# Patient Record
Sex: Female | Born: 1950 | Race: White | Hispanic: No | Smoking: Never smoker
Health system: Southern US, Community
[De-identification: ages and names within clinical notes are randomized; demographics above are authoritative.]

## PROBLEM LIST (undated history)

## (undated) DIAGNOSIS — Z803 Family history of malignant neoplasm of breast: Secondary | ICD-10-CM

## (undated) DIAGNOSIS — K219 Gastro-esophageal reflux disease without esophagitis: Secondary | ICD-10-CM

## (undated) DIAGNOSIS — Z860100 Personal history of colon polyps, unspecified: Secondary | ICD-10-CM

## (undated) DIAGNOSIS — I712 Thoracic aortic aneurysm, without rupture, unspecified: Secondary | ICD-10-CM

## (undated) DIAGNOSIS — Z9884 Bariatric surgery status: Secondary | ICD-10-CM

## (undated) DIAGNOSIS — Z9221 Personal history of antineoplastic chemotherapy: Secondary | ICD-10-CM

## (undated) DIAGNOSIS — G5600 Carpal tunnel syndrome, unspecified upper limb: Secondary | ICD-10-CM

## (undated) DIAGNOSIS — I4891 Unspecified atrial fibrillation: Secondary | ICD-10-CM

## (undated) DIAGNOSIS — I10 Essential (primary) hypertension: Secondary | ICD-10-CM

## (undated) DIAGNOSIS — F419 Anxiety disorder, unspecified: Secondary | ICD-10-CM

## (undated) DIAGNOSIS — M069 Rheumatoid arthritis, unspecified: Secondary | ICD-10-CM

## (undated) DIAGNOSIS — C50919 Malignant neoplasm of unspecified site of unspecified female breast: Secondary | ICD-10-CM

## (undated) DIAGNOSIS — D0512 Intraductal carcinoma in situ of left breast: Secondary | ICD-10-CM

## (undated) HISTORY — PX: BLADDER REPAIR: SHX76

## (undated) HISTORY — DX: Personal history of colon polyps, unspecified: Z86.0100

## (undated) HISTORY — DX: Carpal tunnel syndrome, unspecified upper limb: G56.00

## (undated) HISTORY — PX: HYSTERECTOMY ABDOMINAL WITH SALPINGECTOMY: SHX6725

## (undated) HISTORY — DX: Intraductal carcinoma in situ of left breast: D05.12

## (undated) HISTORY — DX: Family history of malignant neoplasm of breast: Z80.3

## (undated) HISTORY — DX: Thoracic aortic aneurysm, without rupture, unspecified: I71.20

## (undated) HISTORY — DX: Unspecified atrial fibrillation: I48.91

## (undated) HISTORY — PX: GASTRIC BYPASS: SHX52

## (undated) HISTORY — DX: Essential (primary) hypertension: I10

## (undated) HISTORY — PX: COLONOSCOPY W/ POLYPECTOMY: SHX1380

## (undated) HISTORY — DX: Thoracic aortic aneurysm, without rupture: I71.2

## (undated) HISTORY — DX: Rheumatoid arthritis, unspecified: M06.9

## (undated) HISTORY — PX: BREAST BIOPSY: SHX20

## (undated) HISTORY — DX: Bariatric surgery status: Z98.84

## (undated) HISTORY — PX: ABDOMINAL HYSTERECTOMY: SHX81

---
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
## 1998-11-14 ENCOUNTER — Encounter: Admission: RE | Admit: 1998-11-14 | Discharge: 1998-11-14 | Payer: Self-pay | Admitting: Family Medicine

## 1998-12-05 ENCOUNTER — Encounter: Admission: RE | Admit: 1998-12-05 | Discharge: 1998-12-05 | Payer: Self-pay | Admitting: Family Medicine

## 1999-03-06 ENCOUNTER — Encounter: Admission: RE | Admit: 1999-03-06 | Discharge: 1999-03-06 | Payer: Self-pay | Admitting: Family Medicine

## 1999-08-21 ENCOUNTER — Encounter: Admission: RE | Admit: 1999-08-21 | Discharge: 1999-08-21 | Payer: Self-pay | Admitting: Family Medicine

## 1999-08-30 ENCOUNTER — Encounter: Admission: RE | Admit: 1999-08-30 | Discharge: 1999-08-30 | Payer: Self-pay | Admitting: Family Medicine

## 1999-10-16 ENCOUNTER — Encounter: Admission: RE | Admit: 1999-10-16 | Discharge: 1999-10-16 | Payer: Self-pay | Admitting: Family Medicine

## 2000-03-03 ENCOUNTER — Encounter (INDEPENDENT_AMBULATORY_CARE_PROVIDER_SITE_OTHER): Payer: Self-pay | Admitting: *Deleted

## 2000-04-26 ENCOUNTER — Ambulatory Visit: Admission: RE | Admit: 2000-04-26 | Discharge: 2000-04-26 | Payer: Self-pay | Admitting: Family Medicine

## 2000-04-26 ENCOUNTER — Encounter: Admission: RE | Admit: 2000-04-26 | Discharge: 2000-04-26 | Payer: Self-pay | Admitting: Family Medicine

## 2000-05-03 ENCOUNTER — Encounter: Admission: RE | Admit: 2000-05-03 | Discharge: 2000-05-03 | Payer: Self-pay | Admitting: Family Medicine

## 2000-05-03 ENCOUNTER — Encounter: Payer: Self-pay | Admitting: Family Medicine

## 2000-06-04 ENCOUNTER — Ambulatory Visit (HOSPITAL_COMMUNITY): Admission: RE | Admit: 2000-06-04 | Discharge: 2000-06-04 | Payer: Self-pay | Admitting: Cardiology

## 2000-06-17 ENCOUNTER — Encounter: Admission: RE | Admit: 2000-06-17 | Discharge: 2000-06-17 | Payer: Self-pay | Admitting: Family Medicine

## 2000-07-27 ENCOUNTER — Ambulatory Visit (HOSPITAL_BASED_OUTPATIENT_CLINIC_OR_DEPARTMENT_OTHER): Admission: RE | Admit: 2000-07-27 | Discharge: 2000-07-27 | Payer: Self-pay | Admitting: Family Medicine

## 2000-08-19 ENCOUNTER — Encounter: Admission: RE | Admit: 2000-08-19 | Discharge: 2000-08-19 | Payer: Self-pay | Admitting: Family Medicine

## 2000-08-20 DIAGNOSIS — G4730 Sleep apnea, unspecified: Secondary | ICD-10-CM

## 2000-08-20 HISTORY — DX: Sleep apnea, unspecified: G47.30

## 2000-12-13 ENCOUNTER — Encounter: Admission: RE | Admit: 2000-12-13 | Discharge: 2000-12-13 | Payer: Self-pay | Admitting: Family Medicine

## 2000-12-19 ENCOUNTER — Encounter: Admission: RE | Admit: 2000-12-19 | Discharge: 2000-12-19 | Payer: Self-pay | Admitting: Family Medicine

## 2000-12-19 ENCOUNTER — Encounter: Payer: Self-pay | Admitting: Family Medicine

## 2001-01-06 ENCOUNTER — Encounter: Admission: RE | Admit: 2001-01-06 | Discharge: 2001-01-06 | Payer: Self-pay | Admitting: Family Medicine

## 2001-04-22 ENCOUNTER — Encounter (INDEPENDENT_AMBULATORY_CARE_PROVIDER_SITE_OTHER): Payer: Self-pay | Admitting: Specialist

## 2001-04-22 ENCOUNTER — Other Ambulatory Visit: Admission: RE | Admit: 2001-04-22 | Discharge: 2001-04-22 | Payer: Self-pay | Admitting: *Deleted

## 2001-05-28 ENCOUNTER — Encounter (INDEPENDENT_AMBULATORY_CARE_PROVIDER_SITE_OTHER): Payer: Self-pay

## 2001-05-29 ENCOUNTER — Inpatient Hospital Stay (HOSPITAL_COMMUNITY): Admission: RE | Admit: 2001-05-29 | Discharge: 2001-05-30 | Payer: Self-pay | Admitting: *Deleted

## 2001-07-28 ENCOUNTER — Encounter: Admission: RE | Admit: 2001-07-28 | Discharge: 2001-07-28 | Payer: Self-pay | Admitting: Family Medicine

## 2001-09-01 ENCOUNTER — Encounter: Admission: RE | Admit: 2001-09-01 | Discharge: 2001-09-01 | Payer: Self-pay | Admitting: Family Medicine

## 2001-09-12 ENCOUNTER — Encounter: Admission: RE | Admit: 2001-09-12 | Discharge: 2001-09-12 | Payer: Self-pay | Admitting: Family Medicine

## 2001-10-22 ENCOUNTER — Ambulatory Visit (HOSPITAL_COMMUNITY): Admission: RE | Admit: 2001-10-22 | Discharge: 2001-10-22 | Payer: Self-pay | Admitting: Gastroenterology

## 2002-07-03 ENCOUNTER — Encounter: Admission: RE | Admit: 2002-07-03 | Discharge: 2002-07-03 | Payer: Self-pay | Admitting: Family Medicine

## 2002-07-03 ENCOUNTER — Encounter: Payer: Self-pay | Admitting: Family Medicine

## 2002-07-10 ENCOUNTER — Encounter: Admission: RE | Admit: 2002-07-10 | Discharge: 2002-07-10 | Payer: Self-pay | Admitting: Family Medicine

## 2002-07-16 ENCOUNTER — Encounter: Payer: Self-pay | Admitting: Family Medicine

## 2002-07-16 ENCOUNTER — Encounter: Admission: RE | Admit: 2002-07-16 | Discharge: 2002-07-16 | Payer: Self-pay | Admitting: Family Medicine

## 2003-03-09 ENCOUNTER — Inpatient Hospital Stay (HOSPITAL_COMMUNITY): Admission: AC | Admit: 2003-03-09 | Discharge: 2003-03-10 | Payer: Self-pay

## 2003-03-09 ENCOUNTER — Encounter: Payer: Self-pay | Admitting: Emergency Medicine

## 2003-03-24 ENCOUNTER — Encounter: Admission: RE | Admit: 2003-03-24 | Discharge: 2003-03-24 | Payer: Self-pay | Admitting: Family Medicine

## 2003-03-24 ENCOUNTER — Encounter: Admission: RE | Admit: 2003-03-24 | Discharge: 2003-03-24 | Payer: Self-pay | Admitting: Sports Medicine

## 2003-03-24 ENCOUNTER — Encounter: Payer: Self-pay | Admitting: Sports Medicine

## 2003-05-21 ENCOUNTER — Other Ambulatory Visit: Admission: RE | Admit: 2003-05-21 | Discharge: 2003-05-21 | Payer: Self-pay | Admitting: Obstetrics and Gynecology

## 2003-07-21 ENCOUNTER — Encounter: Admission: RE | Admit: 2003-07-21 | Discharge: 2003-07-21 | Payer: Self-pay | Admitting: Family Medicine

## 2003-09-17 ENCOUNTER — Encounter: Admission: RE | Admit: 2003-09-17 | Discharge: 2003-09-17 | Payer: Self-pay | Admitting: Family Medicine

## 2003-10-15 ENCOUNTER — Encounter: Admission: RE | Admit: 2003-10-15 | Discharge: 2003-10-15 | Payer: Self-pay | Admitting: Family Medicine

## 2004-02-02 ENCOUNTER — Encounter: Admission: RE | Admit: 2004-02-02 | Discharge: 2004-02-02 | Payer: Self-pay | Admitting: Family Medicine

## 2004-12-01 ENCOUNTER — Encounter (INDEPENDENT_AMBULATORY_CARE_PROVIDER_SITE_OTHER): Payer: Self-pay | Admitting: Specialist

## 2004-12-01 ENCOUNTER — Ambulatory Visit (HOSPITAL_COMMUNITY): Admission: RE | Admit: 2004-12-01 | Discharge: 2004-12-01 | Payer: Self-pay | Admitting: Obstetrics and Gynecology

## 2005-03-20 ENCOUNTER — Ambulatory Visit: Payer: Self-pay | Admitting: Family Medicine

## 2005-03-28 ENCOUNTER — Encounter: Admission: RE | Admit: 2005-03-28 | Discharge: 2005-03-28 | Payer: Self-pay | Admitting: Obstetrics and Gynecology

## 2005-09-24 ENCOUNTER — Encounter: Admission: RE | Admit: 2005-09-24 | Discharge: 2005-09-24 | Payer: Self-pay | Admitting: Family Medicine

## 2005-09-24 ENCOUNTER — Ambulatory Visit: Payer: Self-pay | Admitting: Family Medicine

## 2005-10-01 ENCOUNTER — Ambulatory Visit: Payer: Self-pay | Admitting: Sports Medicine

## 2005-10-22 ENCOUNTER — Ambulatory Visit: Payer: Self-pay | Admitting: Family Medicine

## 2005-10-26 ENCOUNTER — Encounter: Admission: RE | Admit: 2005-10-26 | Discharge: 2005-10-26 | Payer: Self-pay | Admitting: Family Medicine

## 2005-11-02 ENCOUNTER — Encounter: Admission: RE | Admit: 2005-11-02 | Discharge: 2005-11-02 | Payer: Self-pay | Admitting: Family Medicine

## 2006-01-15 ENCOUNTER — Ambulatory Visit: Payer: Self-pay | Admitting: Family Medicine

## 2006-01-15 ENCOUNTER — Inpatient Hospital Stay (HOSPITAL_COMMUNITY): Admission: EM | Admit: 2006-01-15 | Discharge: 2006-01-17 | Payer: Self-pay | Admitting: Emergency Medicine

## 2006-01-18 ENCOUNTER — Ambulatory Visit: Payer: Self-pay | Admitting: Family Medicine

## 2006-01-25 ENCOUNTER — Ambulatory Visit: Payer: Self-pay | Admitting: Family Medicine

## 2006-02-05 ENCOUNTER — Encounter: Admission: RE | Admit: 2006-02-05 | Discharge: 2006-02-28 | Payer: Self-pay | Admitting: Family Medicine

## 2006-02-15 ENCOUNTER — Ambulatory Visit: Payer: Self-pay | Admitting: Family Medicine

## 2006-04-11 ENCOUNTER — Encounter: Admission: RE | Admit: 2006-04-11 | Discharge: 2006-04-11 | Payer: Self-pay | Admitting: Obstetrics and Gynecology

## 2006-05-06 ENCOUNTER — Ambulatory Visit: Payer: Self-pay | Admitting: Family Medicine

## 2006-10-10 ENCOUNTER — Ambulatory Visit: Payer: Self-pay | Admitting: Sports Medicine

## 2007-01-03 ENCOUNTER — Ambulatory Visit: Payer: Self-pay | Admitting: Family Medicine

## 2007-01-10 ENCOUNTER — Ambulatory Visit: Payer: Self-pay | Admitting: Family Medicine

## 2007-01-30 DIAGNOSIS — L719 Rosacea, unspecified: Secondary | ICD-10-CM

## 2007-01-30 DIAGNOSIS — K649 Unspecified hemorrhoids: Secondary | ICD-10-CM | POA: Insufficient documentation

## 2007-01-30 DIAGNOSIS — K2100 Gastro-esophageal reflux disease with esophagitis, without bleeding: Secondary | ICD-10-CM

## 2007-01-30 DIAGNOSIS — G4700 Insomnia, unspecified: Secondary | ICD-10-CM | POA: Insufficient documentation

## 2007-01-30 DIAGNOSIS — M545 Low back pain, unspecified: Secondary | ICD-10-CM | POA: Insufficient documentation

## 2007-01-30 DIAGNOSIS — G4730 Sleep apnea, unspecified: Secondary | ICD-10-CM | POA: Insufficient documentation

## 2007-01-30 DIAGNOSIS — F32A Depression, unspecified: Secondary | ICD-10-CM | POA: Insufficient documentation

## 2007-01-30 DIAGNOSIS — F329 Major depressive disorder, single episode, unspecified: Secondary | ICD-10-CM

## 2007-01-30 DIAGNOSIS — E669 Obesity, unspecified: Secondary | ICD-10-CM | POA: Insufficient documentation

## 2007-01-30 DIAGNOSIS — G43909 Migraine, unspecified, not intractable, without status migrainosus: Secondary | ICD-10-CM | POA: Insufficient documentation

## 2007-01-30 DIAGNOSIS — F419 Anxiety disorder, unspecified: Secondary | ICD-10-CM | POA: Insufficient documentation

## 2007-01-30 DIAGNOSIS — K5730 Diverticulosis of large intestine without perforation or abscess without bleeding: Secondary | ICD-10-CM | POA: Insufficient documentation

## 2007-01-30 DIAGNOSIS — I10 Essential (primary) hypertension: Secondary | ICD-10-CM | POA: Insufficient documentation

## 2007-01-30 HISTORY — DX: Gastro-esophageal reflux disease with esophagitis, without bleeding: K21.00

## 2007-01-30 HISTORY — DX: Rosacea, unspecified: L71.9

## 2007-01-30 HISTORY — DX: Diverticulosis of large intestine without perforation or abscess without bleeding: K57.30

## 2007-01-31 ENCOUNTER — Encounter (INDEPENDENT_AMBULATORY_CARE_PROVIDER_SITE_OTHER): Payer: Self-pay | Admitting: *Deleted

## 2007-02-17 ENCOUNTER — Telehealth (INDEPENDENT_AMBULATORY_CARE_PROVIDER_SITE_OTHER): Payer: Self-pay | Admitting: *Deleted

## 2007-09-12 ENCOUNTER — Ambulatory Visit: Payer: Self-pay | Admitting: Family Medicine

## 2007-09-12 ENCOUNTER — Ambulatory Visit (HOSPITAL_COMMUNITY): Admission: RE | Admit: 2007-09-12 | Discharge: 2007-09-12 | Payer: Self-pay | Admitting: Family Medicine

## 2007-09-12 DIAGNOSIS — R079 Chest pain, unspecified: Secondary | ICD-10-CM | POA: Insufficient documentation

## 2007-09-16 ENCOUNTER — Encounter: Payer: Self-pay | Admitting: Family Medicine

## 2007-09-16 ENCOUNTER — Ambulatory Visit: Payer: Self-pay | Admitting: Family Medicine

## 2007-09-16 LAB — CONVERTED CEMR LAB
AST: 25 units/L (ref 0–37)
Alkaline Phosphatase: 53 units/L (ref 39–117)
Chloride: 104 meq/L (ref 96–112)
Creatinine, Ser: 1.38 mg/dL — ABNORMAL HIGH (ref 0.40–1.20)
Glucose, Bld: 96 mg/dL (ref 70–99)
Potassium: 4.4 meq/L (ref 3.5–5.3)
Sodium: 140 meq/L (ref 135–145)
TSH: 0.517 microintl units/mL (ref 0.350–5.50)
Total Bilirubin: 0.6 mg/dL (ref 0.3–1.2)
Triglycerides: 172 mg/dL — ABNORMAL HIGH (ref <150)

## 2007-09-23 ENCOUNTER — Encounter: Admission: RE | Admit: 2007-09-23 | Discharge: 2007-09-23 | Payer: Self-pay | Admitting: Obstetrics and Gynecology

## 2007-09-23 ENCOUNTER — Encounter: Payer: Self-pay | Admitting: Family Medicine

## 2007-09-24 ENCOUNTER — Encounter: Payer: Self-pay | Admitting: Family Medicine

## 2007-11-21 ENCOUNTER — Encounter: Payer: Self-pay | Admitting: Family Medicine

## 2007-12-31 ENCOUNTER — Ambulatory Visit: Payer: Self-pay | Admitting: Family Medicine

## 2007-12-31 LAB — CONVERTED CEMR LAB: Rapid Strep: NEGATIVE

## 2008-02-06 ENCOUNTER — Ambulatory Visit (HOSPITAL_COMMUNITY): Admission: RE | Admit: 2008-02-06 | Discharge: 2008-02-06 | Payer: Self-pay | Admitting: Surgery

## 2008-02-11 ENCOUNTER — Ambulatory Visit (HOSPITAL_COMMUNITY): Admission: RE | Admit: 2008-02-11 | Discharge: 2008-02-11 | Payer: Self-pay | Admitting: Surgery

## 2008-03-01 ENCOUNTER — Encounter: Admission: RE | Admit: 2008-03-01 | Discharge: 2008-03-01 | Payer: Self-pay | Admitting: Surgery

## 2008-03-15 ENCOUNTER — Ambulatory Visit: Payer: Self-pay | Admitting: Sports Medicine

## 2008-03-15 ENCOUNTER — Encounter: Payer: Self-pay | Admitting: Family Medicine

## 2008-03-15 ENCOUNTER — Encounter (INDEPENDENT_AMBULATORY_CARE_PROVIDER_SITE_OTHER): Payer: Self-pay | Admitting: Family Medicine

## 2008-03-15 DIAGNOSIS — R319 Hematuria, unspecified: Secondary | ICD-10-CM | POA: Insufficient documentation

## 2008-03-15 LAB — CONVERTED CEMR LAB
Bilirubin Urine: NEGATIVE
Ketones, urine, test strip: NEGATIVE
Protein, U semiquant: 100
pH: 7

## 2008-03-29 ENCOUNTER — Ambulatory Visit: Payer: Self-pay | Admitting: Family Medicine

## 2008-03-29 LAB — CONVERTED CEMR LAB
Bilirubin Urine: NEGATIVE
Blood in Urine, dipstick: NEGATIVE
Glucose, Urine, Semiquant: NEGATIVE
Nitrite: NEGATIVE
Protein, U semiquant: NEGATIVE
pH: 5.5

## 2008-03-30 ENCOUNTER — Ambulatory Visit (HOSPITAL_COMMUNITY): Admission: RE | Admit: 2008-03-30 | Discharge: 2008-03-30 | Payer: Self-pay | Admitting: Surgery

## 2008-05-27 ENCOUNTER — Encounter: Admission: RE | Admit: 2008-05-27 | Discharge: 2008-08-24 | Payer: Self-pay | Admitting: Surgery

## 2008-06-10 ENCOUNTER — Encounter: Payer: Self-pay | Admitting: Family Medicine

## 2008-06-14 ENCOUNTER — Telehealth: Payer: Self-pay | Admitting: *Deleted

## 2008-06-14 ENCOUNTER — Inpatient Hospital Stay (HOSPITAL_COMMUNITY): Admission: RE | Admit: 2008-06-14 | Discharge: 2008-06-16 | Payer: Self-pay | Admitting: Surgery

## 2008-06-15 ENCOUNTER — Ambulatory Visit: Payer: Self-pay | Admitting: Vascular Surgery

## 2008-06-15 ENCOUNTER — Encounter (INDEPENDENT_AMBULATORY_CARE_PROVIDER_SITE_OTHER): Payer: Self-pay | Admitting: Surgery

## 2008-06-18 ENCOUNTER — Telehealth (INDEPENDENT_AMBULATORY_CARE_PROVIDER_SITE_OTHER): Payer: Self-pay | Admitting: *Deleted

## 2008-06-24 ENCOUNTER — Ambulatory Visit: Payer: Self-pay | Admitting: Family Medicine

## 2008-08-15 ENCOUNTER — Emergency Department (HOSPITAL_COMMUNITY): Admission: EM | Admit: 2008-08-15 | Discharge: 2008-08-15 | Payer: Self-pay | Admitting: Emergency Medicine

## 2008-09-06 LAB — CONVERTED CEMR LAB
AST: 21 units/L
Calcium: 10.4 mg/dL
Cholesterol: 131 mg/dL
Creatinine, Ser: 0.78 mg/dL
HCT: 38.1 %
HDL: 37 mg/dL
Hemoglobin: 12.8 g/dL
Platelets: 141 10*3/uL
Saturation Ratios: 24 %
Sodium: 141 meq/L
TIBC: 224 ug/dL
Total Bilirubin: 0.9 mg/dL
Triglycerides: 99 mg/dL
UIBC: 171 ug/dL
WBC: 5.7 10*3/uL

## 2008-09-14 ENCOUNTER — Encounter: Payer: Self-pay | Admitting: Family Medicine

## 2008-09-17 ENCOUNTER — Encounter: Admission: RE | Admit: 2008-09-17 | Discharge: 2008-09-17 | Payer: Self-pay | Admitting: Family Medicine

## 2008-09-17 ENCOUNTER — Ambulatory Visit: Payer: Self-pay | Admitting: Family Medicine

## 2008-09-17 DIAGNOSIS — N200 Calculus of kidney: Secondary | ICD-10-CM

## 2008-09-17 DIAGNOSIS — N281 Cyst of kidney, acquired: Secondary | ICD-10-CM | POA: Insufficient documentation

## 2008-09-17 HISTORY — DX: Calculus of kidney: N20.0

## 2008-09-17 HISTORY — DX: Cyst of kidney, acquired: N28.1

## 2008-09-17 LAB — CONVERTED CEMR LAB
Glucose, Urine, Semiquant: NEGATIVE
Nitrite: NEGATIVE
Specific Gravity, Urine: >=1.03
WBC Urine, dipstick: NEGATIVE
pH: 5.5

## 2008-09-30 ENCOUNTER — Encounter: Admission: RE | Admit: 2008-09-30 | Discharge: 2008-09-30 | Payer: Self-pay | Admitting: Obstetrics and Gynecology

## 2008-11-15 ENCOUNTER — Telehealth: Payer: Self-pay | Admitting: *Deleted

## 2008-11-16 ENCOUNTER — Ambulatory Visit: Payer: Self-pay | Admitting: Sports Medicine

## 2008-11-16 ENCOUNTER — Encounter (INDEPENDENT_AMBULATORY_CARE_PROVIDER_SITE_OTHER): Payer: Self-pay | Admitting: *Deleted

## 2008-11-16 DIAGNOSIS — M66259 Spontaneous rupture of extensor tendons, unspecified thigh: Secondary | ICD-10-CM | POA: Insufficient documentation

## 2008-11-16 DIAGNOSIS — M25569 Pain in unspecified knee: Secondary | ICD-10-CM | POA: Insufficient documentation

## 2008-11-30 ENCOUNTER — Ambulatory Visit: Payer: Self-pay | Admitting: Sports Medicine

## 2008-12-14 ENCOUNTER — Ambulatory Visit: Payer: Self-pay | Admitting: Sports Medicine

## 2008-12-28 ENCOUNTER — Ambulatory Visit: Payer: Self-pay | Admitting: Sports Medicine

## 2009-01-06 ENCOUNTER — Encounter: Payer: Self-pay | Admitting: Family Medicine

## 2009-10-14 ENCOUNTER — Ambulatory Visit: Payer: Self-pay | Admitting: Family Medicine

## 2009-12-13 ENCOUNTER — Telehealth: Payer: Self-pay | Admitting: Family Medicine

## 2009-12-14 ENCOUNTER — Encounter: Payer: Self-pay | Admitting: Family Medicine

## 2010-12-24 ENCOUNTER — Encounter: Payer: Self-pay | Admitting: Surgery

## 2011-01-04 ENCOUNTER — Other Ambulatory Visit: Payer: Self-pay | Admitting: *Deleted

## 2011-01-04 ENCOUNTER — Other Ambulatory Visit: Payer: Self-pay | Admitting: Obstetrics and Gynecology

## 2011-01-04 DIAGNOSIS — Z1231 Encounter for screening mammogram for malignant neoplasm of breast: Secondary | ICD-10-CM

## 2011-01-04 DIAGNOSIS — Z1239 Encounter for other screening for malignant neoplasm of breast: Secondary | ICD-10-CM

## 2011-01-04 NOTE — Miscellaneous (Signed)
Summary: restart BP meds  Clinical Lists Changes See previous phone note. Medications: Changed medication from ENALAPRIL MALEATE 10 MG TABS (ENALAPRIL MALEATE) one by mouth daily to HYDROCHLOROTHIAZIDE 12.5 MG  TABS (HYDROCHLOROTHIAZIDE) Take 1 tab  by mouth every morning - Signed Rx of HYDROCHLOROTHIAZIDE 12.5 MG  TABS (HYDROCHLOROTHIAZIDE) Take 1 tab  by mouth every morning;  #90 x 3;  Signed;  Entered by: Doralee Albino MD;  Authorized by: Doralee Albino MD;  Method used: Electronically to [facility] [address] [place], [place], [place]  [address], Ph: [phone], Fax: [phone]    Prescriptions: HYDROCHLOROTHIAZIDE 12.5 MG  TABS (HYDROCHLOROTHIAZIDE) Take 1 tab  by mouth every morning  #90 x 3   Entered and Authorized by:   Doralee Albino MD   Signed by:   Doralee Albino MD on 12/14/2009   Method used:   Electronically to        [facility] (retail)       [address]       [place], [place]  [address]       Ph: [phone]       Fax: [phone]   RxID:   [id number]

## 2011-01-04 NOTE — Progress Notes (Signed)
Summary: phn msg  Phone Note Call from Patient Call back at [facility] Phone [phone]   Caller: Patient Summary of Call: Pt used to be on bp meds but went off of them when she had gastric bypass surgery.  For the past month she has had headaches and been taking her bp and it has been running about 140/90.  This am it was 145/97.  Wondering if she should go back on bp meds.   Initial call taken by: Clydell Hakim,  December 13, 2009 9:19 AM  Follow-up for Phone Call        Forward to PCP Follow-up by: Gladstone Pih,  December 13, 2009 10:26 AM  Additional Follow-up for Phone Call Additional follow up Details #1::        Called and left message. Additional Follow-up by: Doralee Albino MD,  December 14, 2009 9:57 AM

## 2011-01-12 ENCOUNTER — Ambulatory Visit
Admission: RE | Admit: 2011-01-12 | Discharge: 2011-01-12 | Disposition: A | Discharge status: Home / Self Care | Payer: BC Managed Care – PPO | Source: Ambulatory Visit | Attending: Obstetrics and Gynecology | Admitting: Obstetrics and Gynecology

## 2011-01-12 DIAGNOSIS — Z1231 Encounter for screening mammogram for malignant neoplasm of breast: Secondary | ICD-10-CM

## 2011-02-03 ENCOUNTER — Other Ambulatory Visit: Payer: Self-pay | Admitting: Family Medicine

## 2011-02-05 NOTE — Telephone Encounter (Signed)
Refill request

## 2011-04-17 NOTE — Op Note (Signed)
Christina Hardin, Christina Hardin               ACCOUNT NO.:  [id number]   MEDICAL RECORD NO.:  [id number]          PATIENT TYPE:  INP   LOCATION:  0004                         FACILITY:  [facility]   PHYSICIAN:  Sandria Bales. Ezzard Standing, M.D.  DATE OF BIRTH:  1951/11/08   DATE OF PROCEDURE:  06/14/2008  DATE OF DISCHARGE:                               OPERATIVE REPORT   Date of Surgery ??   PREOPERATIVE DIAGNOSIS:  Morbid obesity (weight 247, body mass index of  41.7).   POSTOPERATIVE DIAGNOSIS:  Morbid obesity (weight 247, body mass index of  41.7).   PROCEDURE:  Laparoscopic Roux-en-Y gastric bypass  (antecolic/antegastric), upper endoscopy.   SURGEON:  Dr. Ezzard Standing.   FIRST ASSISTANT:  Dr. Wenda Low.   ANESTHESIA:  General endotracheal.   ESTIMATED BLOOD LOSS:  Minimal.   PROCEDURE:  [name] is a 60 year old white female patient of Dr.  Doralee Albino who has been morbidly base much of her adult life.  She  had been through our preoperative bariatric program and now comes for  attempted laparoscopic Roux-en-Y gastric bypass.   The indications and potential complications of the procedure were  explained to the patient.  Potential complications include but not  limited to bleeding, infection, bowel leak, deep venous thrombosis, open  surgery, and the possibility of long-term nutritional consequences.   OPERATIVE NOTE:  The patient placed in a supine position.  She was given  2 grams of cefoxitin at the initiation of the procedure.  She had an OG  tube in place and Foley catheter in place.  Her abdomen was prepped with  Techni-Care and sterilely draped.  A timeout was held to identify the  patient and the procedure.   I accessed the abdominal cavity through the left upper quadrant using a  12-mm Ethicon Optiview trocar.  Abdominal exploration was carried out.  The right and left lobes of the liver had some fatty infiltration  consistent with her weight but no other mass or abnormality.   The  stomach that I could see was unremarkable.  The bowel that I could see  was unremarkable.   I then placed six additional trocars.  I placed a 5 mm subxiphoid trocar  for the liver retractor.  I placed a 12 mm right subcostal trocar.  I  placed a 12 mm right lateral subcostal trocar, a 12 mm left lateral  subcostal trocar, a 12 mm left abdominal trocar, a 5 mm trocar in the  left lateral subcostal region, and an 11 mm inferior to the right of the  umbilicus.   The first thing I did was identify the ligament of Treitz.  I counted  down 40 cm and divided the proximal jejunum with a white load of the  Endo GIA 45 mm stapler.  I then took down the mesentery.  I then counted  100 cm on the future gastric limb.  I tightened this future gastric limb  with a Penrose drain.  I then carried out a side-to-side  jejunojejunostomy again with a white load of the Endo-GIA, creating the  enterotomy and closing the  enterotomy with two 2-0 Vicryl sutures.  I  then closed the mesentery of the jejunojejunostomy mesentery with a  running 2-0 chromic suture with lap ties on both ends and I placed  Tisseel over the jejunojejunostomy.   I then divided the omentum with the harmonic scalpel down to the  transverse colon.  The patient was then placed in a reverse  Trendelenburg position with his head up.  I placed a Nissan retractor 55  mm subxiphoid trocar to retract the liver, exposing the gastric hiatus.   I then started my dissection out, creating a gastric pouch.  I first  went along the angle of Hiss first to expose this area, then I came over  the lesser curve approximately 5 cm below the gastroesophageal junction.  I got into the lesser sac.  I then used first a blue load of the Endo 45  mm stapler.  I then used two blue loads of the 60 mm Ethicon stapler and  then another blue load of the 45 stapler to create an approximately 5 x  3 cm gastric pouch.  I over-sewed the distal gastric remnant with  a  locking 2-0 Vicryl suture.  I placed Tisseel along the greater curvature  side of the newly-created sac.   I then went down and retrieved the gastric limb of the jejunum and I  sewed this to the gastric pouch using a posterior running 2-0 Vicryl  suture.  This created an approximately 2.5 cm opening using the linear  45 mm Ethicon stapler.  I then closed the enterotomy with an two running  2-0 Vicryl sutures and then I closed the anterior row with 2-0 Vicryl  sutures.   I had sewed the anterior closure over the e-wall to make sure the lumen  was adequate.  At this point, Dr. Daphine Deutscher scrubbed out and did an upper  endoscopy to document both the pouch size to make sure there was no  bleeding and make sure there was no leak.   I clamped off the jejunum.  He insufflated air to the gastric pouch.  He  saw about a 5-6 cm pouch.  There was no air leak and the mucosa looked  viable.   He then removed the esophagogastroduodenoscope.  I irrigated the upper  abdomen with about a liter of saline.  On re-exam of our principle  anastomosis, I did not think I could get easily sutures of Peterson.  I  did not try to suture that.  Thought I had the split mesentery which  covered it all pretty well.  I then placed Tisseel over the  gastrojejunostomy over the distal jejunal limb.  I then took photos of  the anastomosis.  The incisions all looked good.   I then removed the trocars in turn.  There was no bleeding at any trocar  site.  Each trocar site was closed with 5-0 Vicryl suture, painted with  tincture of Benzoin, and Steri-Stripped.  The patient tolerated the  procedure well.  I used about 30 mL of 0.25% Marcaine by local  anesthetic in the trocar sites.  The patient was transferred to the  recovery room in good condition.  Sponge and needle count were correct.      Sandria Bales. Ezzard Standing, M.D.  Electronically Signed     DHN/MEDQ  D:  06/14/2008  T:  06/14/2008  Job:  [id number]   cc:   William  A. Leveda Anna, M.D.

## 2011-04-20 NOTE — Discharge Summary (Signed)
Christina Hardin, Christina Hardin               ACCOUNT NO.:  [id number]   MEDICAL RECORD NO.:  [id number]          PATIENT TYPE:  INP   LOCATION:  5707                         FACILITY:  [facility]   PHYSICIAN:  Leighton Roach McDiarmid, M.D.DATE OF BIRTH:  August 15, 1951   DATE OF ADMISSION:  01/15/2006  DATE OF DISCHARGE:                                 DISCHARGE SUMMARY   CONSULTATIONS:  Physical therapy and occupational therapy.   PROCEDURES:  The patient had an MRI of the back and spine on February 14  which showed lumbar facet arthropathy at L4-L5 and L5-S1, no change from  prior MRI.   DISCHARGE DIAGNOSIS:  1.  Back pain.  2.  Hypertension.  3.  Hypokalemia.   LABORATORY DATA:  BMP on February 14 showed sodium 140, potassium 3.3,  chloride 104, bicarb 26, glucose 123, BUN 13, creatinine 1, calcium 9.  On  February 15, the patient's potassium came up to 3.4, her glucose was 135.  A  CBC on February 15 showed white blood cells 1.6, H&H 12.8 and 37, and  platelets 217.   HOSPITAL COURSE:  Problem 1:  Back pain.  The patient has a history of back pain but the  patient had an acute onset of back pain and weakness for three days prior to  admission.  She went and picked up her nephew and when returned had an  increase in her lower back pain.  The patient has been told in the past that  she has back spasms and she was started on Tylox and Flexeril with no  relief.  She came into the hospital and she had weakness with difficulty  walking.  The patient had an MRI given the concern for a herniated disc and  this was negative.  Throughout her hospital stay, the patient was initially  started on morphine and then was switched to Percocet p.o. along with  Valium.  The patient's pain decreased on the day of discharge, she was able  to walk and take a shower.  Physical therapy was consulted for suggestions  with her back pain.  She will get home health.  She was also started with a  rolling walker and a lumbar  corset.  She was given in depth education on  back stabilization, body mechanics, proper positioning, and moist heat packs  to decrease pain.  At discharge, the patient was sent home on Percocet and  Valium and told to follow up closely with Dr. Leveda Anna.   Problem 2:  Hypertension.  The patient has a history of hypertension and was  continued on her home meds of Atenolol 100, Enalapril 10,  hydrochlorothiazide 25.   Problem 3:  Hypokalemia.  The patient had some initially low potassiums, she  was given 40 of K-Dur on February 14 and February 15.   Problem 4:  Elevated blood sugars.  This is likely prednisone and should  resolve upon discontinuation.  This may need to be followed by her primary  care physician.   Problem 5:  Leukopenia.  The patient does complain of a cough, it seems to  be getting better, but it is also likely due to the prednisone.   The patient was a full code during her hospital stay here.   DISCHARGE MEDICATIONS:  1.  Prednisone 60 mg p.o. daily through February 17.  2.  Aspirin 81 mg daily.  3.  Atenolol 100 mg daily.  4.  Enalapril 10 mg daily.  5.  Hydrochlorothiazide 25 mg daily.  6.  Valium 2.5 mg q.6h. p.r.n. pain.  7.  Pepcid 20 mg daily.  8.  Nitrogel as needed.  9.  Percocet 325/5, take 1-2 q.4h. p.r.n. pain.  10. Percocet can make her constipated, she may need to use an over the      counter laxative such as Milk of Magnesium.   DISCHARGE INSTRUCTIONS:  The patient was also sent home with [facility] for equipment and [facility] for home health physical therapy  and occupational therapy.      Rolm Gala, M.D.    ______________________________  Leighton Roach McDiarmid, M.D.    Bennetta Laos  D:  01/17/2006  T:  01/17/2006  Job:  [id number]   cc:   William A. Leveda Anna, M.D.  Fax: [phone]
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
## 2011-04-20 NOTE — Op Note (Signed)
[facility]  Patient:    Christina Hardin                      MRN: [id number] Attending:  Miguel Aschoff, M.D.                           Operative Report  NO DICTATION. DD:  05/28/01 TD:  05/28/01 Job: [id number]

## 2011-04-20 NOTE — Op Note (Signed)
[facility]  Patient:    Christina Hardin, Christina Hardin                      MRN: [id number] Proc. Date: 05/28/01 Adm. Date:  [id number] Attending:  Ardeen Fillers                           Operative Report  INDICATIONS:                  A 60 year old woman, G2, P2, status post tubal ligation, premenopausal with significant menorrhagia who has completed a workup and fibroids were encountered.  She declines conservative management and requests aggressive therapy by hysterectomy.  She is interested in having a bilateral salpingo-oophorectomy, as well.  The patient has a small rectocele and a cystocele with some stress urinary incontinence.  Arrangements were made for pubovaginal sling placement by Dr. Barron Alvine to be performed during this procedure, as well.  PREOPERATIVE DIAGNOSES:       1. Menorrhagia.                               2. Fibroids.                               3. Rectocele.  POSTOPERATIVE DIAGNOSES:      1. Menorrhagia.                               2. Fibroids.                               3. Rectocele.  PROCEDURE:                    Laparoscopically assisted vaginal hysterectomy. SURGEON:                      Sung Amabile. Roslyn Smiling, M.D.  ASSISTANT:                    Cordelia Pen A. Rosalio Macadamia, M.D.  ANESTHESIA:                   General anesthesia with endotracheal tube                               intubation.  ESTIMATED BLOOD LOSS:         250 cc.  TUBES AND DRAINS:             Suprapubic catheter placed by Dr. Barron Alvine                               at the end of the procedure.  FINDINGS:                     The uterus was retroverted, 6-8 weeks in size, nontender.  The tubes showed evidence of previous ligation.  Multicystic ovaries.  Normal-appearing pelvis.  Otherwise normal-appearing pelvis. Surgically absent appendix.  The upper abdomen was normal to visualization. Mild rectocele and cystocele.  SPECIMENS:  Uterus, tubes, ovaries and vaginal mucosa to pathology.  DESCRIPTION OF PROCEDURE:     After the establishment of general anesthesia, the patient was placed in the dorsal lithotomy position.  The abdomen, perineum and vagina were prepped with Betadine solution and the bladder was evacuated with straight catheterization.  Examination under anesthesia was carried out with the above findings.  A Graves speculum was inserted into the vagina and the cervix was reprepped with Betadine solution and grasped on the anterior lip with a single-tooth tenaculum.  A Hulka tenaculum was then placed and the speculum was removed.  The patient was draped.  A subumbilical transverse incision was made on the skin with a knife.  Open technique was used to place a Hasson sleeve and trocar.  Dissection through the fat to the fascia was carried out.  The fascia was identified and entered carefully.  The peritoneum was visualized and grasped with a hemostat and entered in a clear space.  Stay sutures were placed at either end of the fascial incision.  Stay sutures were then attached to the Hasson laparoscopic sleeve.  The laparoscope was introduced.  Right and left lower quadrant incisions were made, transilluminating the anterior abdominal wall.  Disposable 10/11 laparoscopic sleeve and trocars were inserted under direct visualization without difficulty after pneumoperitoneum was established.  The above findings were noted.  Photographs were taken.  Attention was first drawn to the right adnexa.  A LigaSure cutting coagulator was used during this case.  Attention was first drawn to the right adnexa where the right tube was visualized and followed to the fimbriated end.  The course of the right ureter was noted.  The right infundibulopelvic ligament was grasped, cauterized and transected well above the right ureter.  The right round ligament was cauterized and transected.  The parametrial tissue  was cauterized and transected in a serial fashion, thus freeing the right side of the uterus from the cardinal ligament.  Attention was turned to the left adnexa, where the left tube was visualized and followed to its fimbriated end.  The left infundibulopelvic ligament was isolated.  Careful inspection of the left ureter was carried out.  It was not visualized close to the infundibulopelvic ligament.  The left infundibulopelvic ligament was cauterized and transected and parametrial bites were made close to the uterus, first cauterizing and then transecting.  The left round ligament was then cauterized and transected.  The vesicoperitoneum was grasped and elevated.  The peritoneum over the anterior uterine segment was nicked and hydrodissection with the Nezhat suction irrigator using normal saline was carried out, thus clarifying the border of the bladder with the overlying serosa.  A bladder flap was created sharply using EndoShears and advanced down the cervix.  Hemostasis was noted. The instruments were covered.  The surgeons moved to the end of the bed to perform the vaginal portion of the case.  A weighted speculum was inserted in the vagina.  The bladder was again evacuated with straight catheterization.  Narrow Deavers were used to gently retract the vaginal side walls.  The cervix was grasped with Bartholome Bill.  Then, 18 cc of 1% Xylocaine with epinephrine 1:100,000 was injected into the cervical portio.  The cervix was circumcised sharply and the bladder was dissected away from the anterior lower uterine segment.  The posterior cul-de-sac was identified and entered sharply.  The long weighted speculum was replaced.  Unless otherwise noted, #1 Vicryl suture material was used.  The uterosacral ligaments were clamped, transected and  ligated with Heaney sutures bilaterally.  The uterine vessels were then clamped bilaterally, transected and suture ligated.  The bladder was  completely dissected off the anterior lower uterine segment.  Serial parametrial bites were taken.  Each of these pedicles was clamped, transected and then suture  ligated.  The posterior fundus was delivered with the aid of towel clamps. The final attachments of the uterus to the broad ligament were clamped, transected and suture ligated.  The specimen was handed off the table.  A sponge stick was placed to retract bowel.  A V-shaped wedge of redundant vaginal mucosa was removed posteriorly.  A pursestring suture of 0 Vicryl was placed.  Before this was closed, the peritoneum between the uterosacral ligaments above the reperitonealization suture was gathered in a stitch to prevent an enterocele.  This was tied.  The sponge stick was removed and the pursestring reperitonealization suture was then cinched and tied.  The ends of this reperitonealization suture were then brought out the posterior portion of the vaginal cuff bilaterally.  Each of these ends of the reperitonealization suture that had been brought out the posterior vaginal cuff area were tied to the ipsilateral uterosacral ligament suture, thus supporting the cuff.  Prior to reperitonealization, a running interlocking stitch was placed to approximate the posterior vaginal cuff with the posterior peritoneum between the uterosacral ligaments.  Hemostasis was noted.  A figure-of-eight suture was used to reapproximate the vaginal cuff walls in a side-to-side closure.  The posterior fourchette was grasped with Allis clamps.  A V-shaped wedge of mucosa was removed from the peritoneal body.  The posterior vagina was opened in the midline by undermining the mucosa and then incising with three-way scissors.  The perirectal fascia was dissected off the posterior vaginal mucosa.  Excessive posterior vaginal mucosa was then trimmed.  The perirectal fascia was re approximated and supported with interrupted sutures of 2-0 chromic.  The  vulvar cavernosus muscles were reapproximated with a single mattress suture of 0 Vicryl.  The posterior vaginal mucosa was closed with a running interlocking stitch of 2-0 chromic.  This stitch was brought out through the perineal body.  The perineal body was reconstructed with running stitch of 2-0 chromic and the perineal body skin was reapproximated with a subcuticular stitch of the same material.  The suture was tied inside the hymenal ring.  Hemostasis was noted.  The surgeons changed gloves and returned to the abdomen.  Dr. Isabel Caprice initiated his surgery at this time.  Photographs were taken.  Hemostasis was noted.  The pelvis was irrigated with warm normal saline.  The lower ports were removed under direct visualization. These two incisions were probed.  The fascia was quite deep and the decision was made not to close the fascia because to do so would have necessitated significant expansion of the incisions.  It was noted that the peritoneal puncture site of each of these ports was well away from the fascial entry points.  Thus, the feeling was that hernia was quite unlikely.  Deep subcutaneous fat sutures were placed with 0 Vicryl bilaterally.  The skin was infiltrated with 0.25% Marcaine.  The pneumoperitoneum was expelled and the laparoscope was removed.  Then, the sleeve was slowly removed.  The subumbilical fascial incision was closed in a running fashion with the previously-placed suture.  The subumbilical skin incision was infiltrated with 0.25% Marcaine.  The fat was closed with an interrupted suture of 0 Vicryl.  All skin incisions were closed with running subcuticular stitch  of 4-0 Vicryl.  Steri-Strips were placed over each of the incision sites.  Final sponge, needle and blade counts were correct.  Dr. Isabel Caprice completed his portion of the case. DD:  05/28/01 TD:  05/29/01 Job: [id number]

## 2011-04-20 NOTE — Procedures (Signed)
[place]. [facility]  Patient:    Christina Hardin, Christina Hardin. Visit Number: [id number] MRN: [id number]          Service Type: Attending:  Anselmo Rod, M.D. Dictated by:   Anselmo Rod, M.D. Proc. Date: 10/23/01   CC:         William A. Leveda Anna, M.D.   Procedure Report  PROCEDURE PERFORMED:  Colonoscopy.  ENDOSCOPIST:  Anselmo Rod, M.D.  INSTRUMENT USED:  Olympus video colonoscope.  INDICATION FOR PROCEDURE:  Rectal bleeding in a 60 year old white female with family history of colon cancer.  Rule out colonic polyps, masses, hemorrhoids, etc.  PREPROCEDURE PREPARATION:  Informed consent was procured from the patient. The patient was fasted for 8 hours prior to the procedure and prepped with a bottle of magnesium citrate and a gallon of NuLytely the night prior to the procedure.  PREPROCEDURE PHYSICAL:  Patient has stable vital signs.  NECK: Supple.  CHEST:  Clear to auscultation. S1, S2 regular.  ABDOMEN:  Soft with normal bowel sounds.  DESCRIPTION OF PROCEDURE:  The patient was placed in the left lateral decubitus position and sedated with 100 mg of Demerol and 10 mg of Versed intravenously.  Once the patient was adequately sedated and maintained on low-flow oxygen and continuous cardiac monitoring, the Olympus video colonoscope was advanced from the rectum to the cecum without difficulty. Except for small external hemorrhoids and a few left-sided diverticula, no other abnormalities were seen.  No masses or polyps were present.  IMPRESSION: 1. Few left-sided diverticula. 2. Small external hemorrhoids. 3. No masses or polyps seen.  RECOMMENDATIONS: 1. High-fiber diet has been discussed with the patient and ______ brochures    on diverticulosis have been handed to her for education. 2. Repeat colorectal cancer screening was recommended in the next five years    unless the patient were to develop any abnormal symptoms in the interim. 3.  If the patient has recurrent rectal bleeding, she is to call the office. Dictated by:   Anselmo Rod, M.D. Attending:  Anselmo Rod, M.D. DD:  10/23/01 TD:  10/23/01 Job: [id number]

## 2011-04-20 NOTE — Cardiovascular Report (Signed)
[place]. [facility]  Patient:    Christina Hardin, Christina Hardin                        MRN: [id number] Proc. Date: 06/04/00 Attending:  Thereasa Solo. Little, M.D. CC:         Santiago Bumpers. Leveda Anna, M.D.                        Cardiac Catheterization  INDICATIONS FOR PROCEDURE:  Ms. [name] is a 60 year old female, who has complained of left neck, shoulder pain and arm pain and shortness of breath and nuclear study was positive for scarring and questionable peri-infarction ischemia.  PROCEDURES: 1. Left heart catheterization. 2. Selective right and left coronary arteriography. 3. Ventriculography in the right anterior oblique projection.  DESCRIPTION OF PROCEDURE:  The patient was prepped and draped in the usual sterile fashion exposing the right groin.  Following local anesthetic with 1% Xylocaine, the Seldinger technique was employed, and a 6 [place] introducer sheath was placed in the right femoral artery.  Selective right and left coronary arteriography and ventriculography in the RAO projection were performed.  The 6 French Judkins configuration catheters were used.  No complications were noted.  RESULTS: 1. Hemodynamic monitoring:  Central aortic pressure 148/88, left    ventricular pressure 142/21 with no aortic valve gradient noted at the    time of pullback. 2. Ventriculography:  Ventriculography in the RAO projection revealed    normal left ventricular systolic function.  The ejection fraction    was greater than 55%.  PVCs were noted during the ventriculogram.    The end-diastolic pressure was 19.  CORONARY ARTERIOGRAPHY:  There was no calcification noted on fluoroscopy. 1. Left main:  Normal. 2. LAD.  The LAD extended down to the apex of the heart and gave rise to    one moderate sized first diagonal branch.  This entire system was free    of disease. 3. Right coronary artery:  The right coronary artery was a small    diameter vessel giving rise only to the PDA  free of disease. 4. Circumflex:  The circumflex was a large dominant vessel giving rise to    three OMs.  This entire system was free of disease.  CONCLUSIONS: 1. No evidence of coronary artery disease. 2. Normal left ventricular systolic function.  DISCUSSION:  There is nothing to suggest that the cause for her shortness of breath or shoulder, neck and arm pain is cardiac in origin.  I suspect that the nuclear study was a false-positive study.  She will be discharged to home later today and followup in my office on June 06, 2000. DD:  06/06/00 TD:  06/06/00 Job: [id number]

## 2011-04-20 NOTE — H&P (Signed)
[facility]  Patient:    Christina Hardin, Christina Hardin                        MRN: [id number] Adm. Date:  05/28/01 Attending:  Sung Amabile. Roslyn Smiling, M.D.                         History and Physical  DATE OF BIRTH:                01/04/1951  REASON FOR ADMISSION:         The patient will be admitted to [facility] on May 28, 2001, for inpatient surgery.  CHIEF COMPLAINT:              Menometrorrhagia and endometrial mass.  HISTORY OF PRESENT ILLNESS:   A 60 year old woman, G6, P3-0-3-3, status post tubal ligation in the past, who has experienced ongoing menorrhagia for almost a year.  The patient was first treated with progesterone in January. Appropriate withdrawal bleeding occurred.  Her hemoglobin at that time was 11. Ultrasound revealed fibroids.  TSH was slightly low, but free T4 was normal. FHS was 9.43.  Endometrial biopsy showed blood with scant admixed weakly proliferative endometrium on Apr 21, 2001.  Sonohistogram revealed a differential between the anterior and posterior wall thicknesses.  Regular ultrasound suggested a hyperechoic mass in the endometrium, and a small fibroid was identified.  Initial management proposed was a hysteroscopy.  The patient declines hysteroscopy and asked for definitive management by hysterectomy.  She is interested in removal of her ovaries as well, and laparoscopic-assisted vaginal hysterectomy is planned.  The patient has experienced stress urinary incontinence symptoms and has been evaluated by Dr. Barron Alvine who plans to perform a sling procedure at the time if this hysterectomy.  She has been counselled regarding the benefits, risks, options, and expected outcome of this procedure prior to surgery.  PAST MEDICAL HISTORY:         Medical hypertension.  Surgical tubal ligation in 1978, appendectomy at age 60, D&C x 3 after spontaneous abortion.  OBSTETRICAL HISTORY:          Vaginal deliveries 1970, 1973,  1978.  MEDICATIONS:                  Hydrochlorothiazide, aspirin 1 daily, and Correctol.  ALLERGIES:                    DARVON causes hyperventilation.  FAMILY HISTORY:               Father with emphysema, mother and sister with hypertension, son with childhood asthma.  SOCIAL HISTORY:               Married, Research officer, trade union at [employer].  Tobacco use in the past but quit smoking 60 years ago, denies alcohol use.  PHYSICAL EXAMINATION:  VITAL SIGNS:                  Afebrile.  Recent blood pressure 110/80.  HEENT:                        Within normal limits.  NECK:                         Without thyromegaly.  CHEST:  Clear.  COR:                          Regular rate and rhythm.  S1 and S2 normal.  BREASTS:                      Without mass, tenderness, axillary or supraclavicular nodes.  ABDOMEN:                      Soft, nontender, without organomegaly, mass, or hernia.  Well-healed surgical scars.  BACK:                         Without CVA tenderness.  GU:                           External genitalia, BUS, vagina without lesions.  Cervix without lesions. Uterus six to eight-week size, slightly irregular, nontender, mobile.  Adnexa normal to palpation.  Rectovaginal exam confirmatory.  EXTREMITIES:                  Without clubbing, cyanosis, or edema.  SKIN:                         Without lesions.  NEUROLOGIC:                   Grossly intact.  LABORATORY DATA:              Pap smear per Dr. Leveda Anna within normal limits in May 2001.  ASSESSMENT/PLAN: 1. Menorrhagia.  The patient requests definitive therapy and bilateral    salpingo-oophorectomy.  Laparoscopic-assisted vaginal hysterectomy will be    performed.  If unable to remove uterus vaginally, the patient has    ultimately consented for total abdominal hysterectomy and bilateral    salpingo-oophorectomy. 2. Stress urinary incontinence.  Sling procedure by Dr.  Isabel Caprice at time of    surgery. 3. Hypertension. 4. Status post appendectomy. 5. Status post tubal ligation.DD:  05/27/01 TD:  05/27/01 Job: [id number]

## 2011-04-20 NOTE — Discharge Summary (Signed)
[facility]  Patient:    Christina Hardin, Christina Hardin                      MRN: [id number] Adm. Date:  [id number] Disc. Date: [id number] Attending:  Ardeen Fillers                           Discharge Summary  DISCHARGE DIAGNOSES:          1. Menorrhagia.                               2. Stress urinary incontinence.                               3. Cystocele.                               4. Rectocele.                               5. Simple hyperplasia of the endometrium.                               6. Right ovarian serous cystadenoma.  PROCEDURE:                    Laparoscopically assisted vaginal hysterectomy and posterior colporrhaphy by Dr. Roslyn Smiling on May 28, 2001.  Anterior repair, flexible cystoscopy, pubovaginal sling, suprapubic tube placement by Dr. Barron Alvine on May 28, 2001.  HISTORY OF PRESENT ILLNESS:   A 60 year old woman G6, P3-0-3-3 status post tubal ligation in the past who has experienced ongoing menorrhagia for almost a year.  The patient was first treated with progesterone in January. Appropriate withdrawal bleeding occurred.  Hemoglobin at that time was 11. Ultrasound revealed fibroids.  TSH was slightly low but free T4 was normal. FSH was 9.43.  Endometrial biopsy showed blood was scant ______ weakly proliferative endometrium in May.  Sonohysterogram revealed differential thickness between the anterior and posterior walls of the endometrium.  A small fibroid was also identified.  Initial management proposed was hysteroscopy but the patient declined this approach and asked for definitive management by hysterectomy. She has been given the risks and benefits of oophorectomy and has chosen to have that performed as well.  Because she has stress urinary incontinence symptoms, Dr. Barron Alvine was to collaborate on a pubovaginal sling procedure.  Patient was admitted to the hospital on June 26 and taken to the operating room where  she underwent laparoscopically assisted vaginal hysterectomy and rectocele repair by Dr. Roslyn Smiling and Dr. Rosalio Macadamia.  Dr. Barron Alvine performed an anterior repair, cystoscopy, pubovaginal sling, and suprapubic catheter placement.  All these were without complications.  Final pathology later revealed simple hyperplasia of the endometrium and right ovarian serous cystadenoma and left inclusion cyst of the ______.  The patients postpartum course was unremarkable.  She was taught to care for her suprapubic catheter and to begin voiding trials on the first postoperative day.  Postoperative hemoglobin was 10.6 and this was well tolerated.  She was discharged to home on second postoperative day in satisfactory condition.  She will follow-up with Dr. Roslyn Smiling  in two weeks.  Routine status post vaginal hysterectomy instructions are given.  Instructions regarding suprapubic catheter treatment per Dr. Isabel Caprice.  DISCHARGE MEDICATIONS:        1. Tylox one to two p.o. q.4h. p.r.n. pain.                               2. Nonsteroidal anti-inflammatory medication.                               3. Estrogen patch 0.1 (x 2) change q.week.                               4. Previous medications. DD:  07/03/01 TD:  07/03/01 Job: [id number]

## 2011-04-20 NOTE — H&P (Signed)
[name], Hardin               ACCOUNT NO.:  [id number]   MEDICAL RECORD NO.:  [id number]          PATIENT TYPE:  EMS   LOCATION:  MAJO                         FACILITY:  [facility]   PHYSICIAN:  Santiago Bumpers. Hensel, M.D.DATE OF BIRTH:  02-17-1951   DATE OF ADMISSION:  01/15/2006  DATE OF DISCHARGE:                                HISTORY & PHYSICAL   CHIEF COMPLAINT:  Acute low back pain with weakness.   HISTORY OF PRESENT ILLNESS:  The patient is a 60 year old white female with  past medical history significant for chronic low back pain that persists.  She presents with a 3-day history of acute lower back pain and weakness. The  patient went on a car ride on Thursday, picked up her nephew who is quite  heavy set and returned and had new onset of lower back pain on Friday as  well as extending into Sunday. The patient was told she had back spasms  after calling in Call a Nurse and was told to use heat and her home  medications. The patient was seen the next day and Tylox and Flexeril were  called in. She had no relief. The patient was then instructed to go to the  [facility]. However, she was unable to do so secondary to  weakness. The patient first had the initial weakness and difficulty walking  4 days ago. She said her legs felt like they would give out. The patient has  a known history of degenerative facet arthropathy and MRI on December 2  showed changes of arthritis. No nerve root compressions. The patient is now  immobile secondary to the acute onset of severe pain with radiation down her  left leg and causing weakness.   REVIEW OF SYSTEMS:  CONSTITUTIONAL:  No fevers or chills. CARDIOVASCULAR:  No chest pain. PULMONARY:  No shortness of breath. GI: There is no abdominal  pain, no nausea, vomiting or diarrhea, bowel function intact. GU: No  incontinence. SKIN: There are no new rashes. All other review of systems are  unremarkable.   PAST MEDICAL HISTORY:  1.   Obesity.  2.  Hypertension.  3.  Depressive disorder.  4.  Migraines.  5.  Hemorrhoids.  6.  Reflux.  7.  Esophagitis.  8.  Diverticulitis of the colon.  9.  Chronic low back pain.  10. Sleep apnea.   HOME MEDICATIONS:  1.  Aspirin one daily.  2.  Atenolol 100 mg daily.  3.  Darvocet N 100 t.i.d. p.r.n. back pain.  4.  Enalapril 10 mg daily.  5.  Flexeril 10 mg t.i.d. p.r.n.  6.  Hydrochlorothiazide 25 mg daily.  7.  Ibuprofen over-the-counter p.r.n. back pain.  8.  Pepcid 20 mg daily.  9.  Phenergan 25 mg suppositories q.6h p.r.n.  10. Tums 2-4 daily.  11. Tylox q.4h p.r.n.   ALLERGIES:  DARVON.   FAMILY HISTORY:  Positive for diabetes mellitus, coronary artery disease,  cancer. Negative for stroke, alcoholism and depression.   SOCIAL HISTORY:  The patient is a non smoker, non drinker. She is a Runner, broadcasting/film/video.  PHYSICAL EXAMINATION:  VITAL SIGNS:  On admission temperature was 98.6,  heart rate 69, blood pressure 113/72, respirations 20, O2 saturation 98% on  room air.  GENERAL:  She was drowsy secondary to her medications given in the emergency  department. She is in no acute distress at this time secondary to getting  medications.  MENTAL STATUS:  Alert and oriented x3.  HEENT:  Head normocephalic, atraumatic. Pupils are equal, round and reactive  to light bilaterally. Extraocular movements are intact. Ears clear  bilaterally. Mouth and throat - oropharynx benign, no lesions. Mucous  membranes moist.  CHEST:  Lungs clear to auscultation bilaterally.  CARDIOVASCULAR:  Regular rate and rhythm, no murmurs, rubs, or gallops.  ABDOMEN:  Nontender, nondistended, soft, obese.  EXTREMITIES:  Left lower extremity with decreased range of motion, positive  straight leg raise, pain in the back with no radiation down the leg. There  was pain with internal rotation, no pain with external rotation. Right leg  with straight leg pain that radiated to her lower back in the L1-2   distribution of the back and down the leg.  NEUROLOGIC:  She has +2 reflexes bilaterally in lower extremities. Intact  sensation and proprioception. Cranial nerves II-XII grossly intact. Balance  and gait - unable to assess secondary to the patient being immobile  secondary to pain and patient with neither sit up nor stand.   MEDICATIONS GIVEN IN THE EMERGENCY DEPARTMENT:  Dilaudid 2 mg IV. She was  given Narcan secondary to respiratory distress with O2 saturations down into  the 40's (and was then given Narcan). The patient was then given Morphine 2  mg for pain as well as Phenergan.   ASSESSMENT AND PLAN:  This is a 60 year old white female with low back pain,  now with acute weakness.  PROBLEM #1. Low back pain. Patient with a known degenerative facet  arthropathy and chronic back pain. However, weakness of the left leg is a  new onset. Recent MRI shows no acute disk herniations, or nerve root  compression. Now with radiculopathy in L1, and L2 distribution. There is  concern now for the herniated nucleus pulposus versus nerve root  compression. We will go ahead and obtain an MRI of the lumbar spine.  However, patient states that she is unable to do the small MRI and needs  open MRI. We will go ahead and give her muscle relaxants and analgesics for  pain. May also be a component of muscle spasms. There are no worrisome  features for bony fractures or signs/symptoms of trauma or incontinence. We  will go ahead and start a Prednisone 60 mg 5-day course for  antiinflammatory.   PROBLEM #2. Pain control. We are going to give Morphine 2 mg IV q.4h p.r.n.  Will given Valium 2.5 mg for muscle spasms q.6h p.r.n. x24 hours. We are  going to give Prednisone 60 mg p.o. x5 days for inflammation. Once pain is  better controlled we will hopefully get patient up out of bed and will be  able to discharge her home once the pain is well controlled. The patient shows no signs of pain seeking behavior  per her primary physician.   PROBLEM #3.  FENGI. Regular diet. Heparin lock or IV. Will check a B-MET in  the morning secondary to patient being on antihypertensives. Continue  Protonix while the patient is inpatient.   PROBLEM #4.  Deep venous thrombosis prophylaxis. Start STDs while she is in  bed.   PROBLEM #5.  Hypertension. Continue home dose of Atenolol 100 mg daily,  Enalapril 10 mg daily and hydrochlorothiazide 25 mg daily. Currently her  hypertension is stable.   PROBLEM #6. Thromboembolic CVA and myocardial infarction prophylaxis -  continue her home dose of aspirin.   Disposition is pending on her pain control as well as getting her up and out  of bed.      Barth Kirks, M.D.    ______________________________  Santiago Bumpers. Leveda Anna, M.D.    MB/MEDQ  D:  01/15/2006  T:  01/15/2006  Job:  [id number]

## 2011-04-20 NOTE — Op Note (Signed)
NAMEJOSI, Christina Hardin               ACCOUNT NO.:  [id number]   MEDICAL RECORD NO.:  [id number]          PATIENT TYPE:  AMB   LOCATION:  SDC                           FACILITY:  [facility]   PHYSICIAN:  Lenoard Aden, M.D.DATE OF BIRTH:  March 14, 1951   DATE OF PROCEDURE:  DATE OF DISCHARGE:                                 OPERATIVE REPORT   PREOPERATIVE DIAGNOSIS:  1.  Vaginal stenosis.  2.  Dyspareunia.   POSTOPERATIVE DIAGNOSIS:  1.  Vaginal stenosis.  2.  Dyspareunia.   PROCEDURE:  Perineoplasty.   SURGEON:  Lenoard Aden, M.D.   ANESTHESIA:  General.   ESTIMATED BLOOD LOSS:  50 mL.   COMPLICATIONS:  None.   DRAINS:  None.   COUNTS:  Correct.   DISPOSITION:  Patient to recovery in good condition.   DESCRIPTION OF PROCEDURE:  Being apprised of the risks of the anesthesia,  infection, bleeding, inability to cure dyspareunia, the patient is brought  to the operating where she was administered general anesthetic without  complications, prepped and draped in the usual sterile fashion, catheterized  until the bladder was empty.  Examination under anesthesia reveals the  previously noted excoriated area along the posterior fourchette with some  thinning and atrophy of the skin in this area.  Therefore, at this time this  area is outlined using a blue marking pin.  An elliptical shaped incision is  made from 12 to 6 o'clock and incised in an elliptical fashion.  Good  hemostasis is achieved in the area where the skin was removed using  electrocautery.  Then the skin, the peroneal and vaginal mucosa is  undermined, removing scar tissue in the process and closed using 3-0 Vicryl  in multiple interrupted  sutures from front to back.  This has achieved good relaxation to the  previously stenotic area with excision of scar tissue.  Good hemostasis was  noted.  The patient tolerated the procedure well and was awakened and  transferred to the recovery room in good condition.   @      RJT/MEDQ  D:  12/01/2004  T:  12/01/2004  Job:  [id number]

## 2011-06-28 ENCOUNTER — Telehealth: Payer: Self-pay | Admitting: Family Medicine

## 2011-06-28 NOTE — Telephone Encounter (Signed)
Want to know if there is a record of her  hysterectomy and bladder procedure in her chart.  Want to determine if she has had the vaginal mesh procedure for the bladder surgery.  Need to know the name of the surgeon also.  Please contact her at earliest convenience on this.

## 2011-07-02 NOTE — Telephone Encounter (Signed)
Found op report from 05/28/2001.  Complete note by Dr. Roslyn Smiling makes no mention of mesh.  Indicates that Dr. Isabel Caprice did a portion of the procedure and no op report from his portion found.  Patient notified.  She will call Grapey's office and try to get details.

## 2011-07-03 ENCOUNTER — Encounter: Payer: Self-pay | Admitting: Family Medicine

## 2011-07-03 ENCOUNTER — Ambulatory Visit (INDEPENDENT_AMBULATORY_CARE_PROVIDER_SITE_OTHER): Payer: BC Managed Care – PPO | Admitting: Family Medicine

## 2011-07-03 VITALS — BP 142/95 | HR 72 | Temp 97.9°F | Ht 64.5 in | Wt 184.9 lb

## 2011-07-03 DIAGNOSIS — L719 Rosacea, unspecified: Secondary | ICD-10-CM

## 2011-07-03 MED ORDER — METRONIDAZOLE 1 % EX GEL: Freq: Every day | CUTANEOUS | Status: DC

## 2011-07-03 MED ORDER — ZOSTER VACCINE LIVE 19400 UNT/0.65ML ~~LOC~~ SOLR: 0.6500 mL | Freq: Once | SUBCUTANEOUS | Status: DC

## 2011-07-03 NOTE — Patient Instructions (Signed)
Rosacea, Frequently Asked Questions (FAQs) Rosacea is a long-term (chronic) disease that affects the skin and sometimes the eyes. Symptoms include redness and pimples. In advanced stages, patients may get thickened skin. Rosacea usually affects the face. Other parts of the upper body are rarely involved. Over time, the redness becomes ruddier and lasts longer. It usually begins after age 60 and is marked by periods of flaring up and then getting better. If left untreated, over time, a condition called rhinophyma may develop. This is a condition on the nose of bumps and pimples. In severe cases the nose may become swollen and bumpy from excess tissue. Sometimes the eyes are affected with the skin problems. However, it is possible to have eye involvement alone without the skin changes. The eyes feel irritated and appear watery or bloodshot.   There is no cure for rosacea and the cause is unknown. Treatment is available to control or reverse its signs and symptoms. Individuals who suspect they may have rosacea are urged to see a doctor specializing in skin problems (dermatologist) or other knowledgeable physician for diagnosis and the right treatment. WHO GETS ROSACEA? Rosacea can affect anyone. Individuals with fair skin who tend to flush or blush easily are more often affected. Rosacea can develop in people of any skin color. It tends to occur most commonly and is most apparent in people with fair skin with light hair and eye color. It most often affects adults between the ages of 40 and 66. It is more common in women, particularly during menopause, than men. WHAT DOES ROSACEA LOOK LIKE? There are several symptoms and conditions associated with rosacea. They include:  Frequent flushing or redness of the center of the face. This may appear on the forehead, nose, cheeks, and chin. Frequent flushing occurs in the earliest stage of rosacea. The flushing often is accompanied by a burning sensation, particularly  when creams or cosmetics are applied to the face. Sometimes the face is swollen slightly.   A condition called vascular rosacea causes persistent flushing and redness. Blood vessels under the skin of the face may get bigger. These blood vessels show through the skin as small red lines. This is called telangiectasia. The affected skin may be swollen slightly and feel warm.   A condition called inflammatory rosacea causes persistent redness and pink bumps (papules) and bumps containing pus (pustules) on the skin. Eye inflammation and sensitivity as well as telangiectasia also may occur. This stage of rosacea may look like acne, but with rosacea there is an absence of whiteheads/blackheads (comedones).   In the most advanced stage of rosacea, the skin becomes a deep shade of red and inflammation of the eye is more obvious. Numerous red blotches on the skin (telangiectases) are often present. Nodules in the skin may become painful. A condition called rhinophyma also may develop in some men. It is rare in women. Rhinophyma includes an enlarged, bulbous, and red nose. It is the result of an enlarged oil-producing (sebaceous) glands. These glands are beneath the surface of the skin on the nose. People who have rosacea may develop a thickening of the skin on the forehead, chin, cheeks, or other areas.  HOW IS THE EYE AFFECTED? Up to 50 percent of people who have rosacea have eye problems caused by the condition. Typical eye symptoms include:  Redness.   Dryness.   Itching.   Burning.   Tearing.   Sensation of having sand in the eye.   Eyelids may become inflamed and swollen.  Sensitivity to light   Blurred vision.   Reduced seeing ability.  WHAT CAUSES ROSACEA? Doctors do not know the exact cause of rosacea. It is thought some people may inherit a tendency to develop the disorder. Some researchers believe that rosacea is a disorder where blood vessels dilate too easily, resulting in flushing  and redness. Factors that cause rosacea to flare up in one person may have no effect on another person. Although the following factors have not been well-researched, some people claim that one or more of them have aggravated their rosacea:    Heat (including hot baths).   Strenuous exercise.   Sunlight.   Wind.   Very cold temperatures.   Hot or spicy foods and drinks.   Alcohol consumption.   Menopause.   Emotional stress.   Long-term use of topical steroids on the face.  Patients affected by pustules may assume they are caused by bacteria. Researchers have not established a link between rosacea and bacteria or other organisms on the skin, in the hair follicles, or elsewhere in the body. CAN ROSACEA BE CURED?  The goals of treatment are to control the condition and improve the appearance of the patient's skin. It may take several weeks or months of treatment before a person notices an improvement of the skin.   Some doctors will prescribe a topical antibiotic such as Metronidazole. This is applied directly to the affected skin. For people with more severe cases, doctors often prescribe an oral (taken by mouth) antibiotic. The papules and pustules common in rosacea may get better quickly with treatment. Redness and flushing are less likely to improve.   Some people who have rosacea become depressed by the changes in the appearance of their skin. A doctor should be consulted if a person feels unusually sad or has other symptoms of depression, such as loss of appetite or trouble concentrating. The National Rosacea Society shows that people who have rosacea often experience:   Low self-esteem.   Embarrassment because of their appearance.   Claim their social and professional interactions suffer.   Caregivers usually treat the eye problems of rosacea with oral antibiotics. People who develop infections of the eyelids must practice frequent eyelid hygiene. The caregiver may also  recommend:   Scrubbing the eyelids gently with diluted baby shampoo or an over-the-counter eyelid cleaner.   Applying warm, but not hot, compresses several times a day.   Electrosurgery and laser surgery are treatment options if red lines caused by dilated blood vessels appear in the skin (telangectases). These treatments may also be used if rhinophyma develops. For some patients, laser surgery may improve the skin's appearance with little scarring or damage. For patients with rhinophyma, surgical removal of the excess tissue to reduce the size of the nose usually will improve appearance.  WORKING WITH YOUR CAREGIVER TO MANAGE ROSACEA The role you play in managing your rosacea is important. You can take several steps to keep rosacea under control:  Keep a written record of when flare-ups occur. This may provide clues about what is irritating the skin.   Use mild facial cleansers.   Most people should use a sunscreen every day that protects against UVA and UVB rays (ultraviolet rays). Sunscreen should have a sun-protecting factor (SPF) of 15 or higher. Sunscreen is particularly important for people whose skin is irritated by exposure to the sun. Sunscreens with physical blockers, like zinc oxide or titanium dioxide, may be better tolerated than chemical sunscreens.   Using a mild  moisturizer may be helpful. Avoid applying any irritating products to the face. Some people find that a green-tinted makeup conceals skin redness.   If your eyes are affected, follow your caregiver's treatment plan. Clean your eyelids as instructed.  TREATMENT  Signs and symptoms of rosacea vary from patient to patient. Treatment must be made to fit the case.   Various oral and topical medicine may be prescribed. Dermatologists usually prescribe initial treatment with oral antibiotics and topical therapy to bring the condition under immediate control. It may take 4 to 6 weeks to see results from using the medication.  This is followed by long-term use of the topical therapy alone to maintain remission.   Treatments with lasers, intense pulsed light sources or other medical and surgical devices may be used to:   Remove visible blood vessels.   Reduce extensive redness.   Fix disfigurement of the nose.  Ocular rosacea may be treated with oral antibiotics and other therapy.  Avoiding lifestyle and environmental factors that trigger rosacea flare-ups.   The National Rosacea Society offers a free Rosacea Diary Booklet to assist patients in identifying factors that may affect their individual cases. They also offer a booklet called "Coping with Rosacea" that provides tips on lifestyle management.  WHERE CAN PEOPLE GET MORE INFORMATION ABOUT ROSACEA? [employer] (NIAMS) [facility] [address] [place], [place] [address] Phone: [phone] or [phone]) (free of charge) TTY: [phone] Fax: [phone] E-mail: [email or web address]/ [email or web address]://www.myers.net/ American Academy of [employer]ermatology [address] [place], [place] [address] Phone: [phone] or [phone] (free of charge) Fax: [phone] InfoExam.si [facility] Rosacea Society [address] [place], [place] [address] Phone: 888-NO-BLUSH [phone]) (free of charge) Fax: [phone] E-mail: [email or web address]@[email or web address] .com www.rosacea.org The NIAMS gratefully acknowledges the assistance of Otelia Santee, M.D., [facility], [place], [place]; Weldon Picking, National Rosacea Society, [place], [place]; Phylliss Bob, M.D., [place], [place]; Juel Burrow, M.D., [facility], [place], [place]; Doris Cheadle, M.D., Arna Snipe, [place]; Maxine Glenn, M.D., NIAMS, NIH; and Paula Libra, M.D., [facility], [place], [place], in the review of this fact sheet. [employer] NIAMS/National [employer] of  Health [address] [place], [place] [address] Document Released: 11/20/2004 Document Re-Released: 05/09/2010 [facility] Patient Information 2011 [place], [place].

## 2011-07-05 NOTE — Assessment & Plan Note (Signed)
Appears milder than what she had described as her rosacea in the past.  Will treat with topical metrogel.  Given handout on dietary, behavioral modifications as well.  Follow-up prn.

## 2011-07-05 NOTE — Progress Notes (Signed)
  Subjective:    Patient ID: Christina Hardin, female    DOB: 1951-08-19, 60 y.o.   MRN: [id number]  HPI Greater than 4 weeks of rash on face.  Located in t-zone area- forehead and nose.  At onset, may have been some pustules or vesicles.  Now resolving.  No pain, fever, infection.  Has history of rosacea.   Review of Systems     Objective:   Physical Exam GEN:  NAD, well appearing Skin:  Erythema with papules  over area on forehead in between brows down to nose.  No vesicles or evidence of vesicles noted.  ? One Early pustule.  A few small telangiectasias.  No ocular involvement.      Assessment & Plan:

## 2011-07-18 ENCOUNTER — Telehealth: Payer: Self-pay | Admitting: Family Medicine

## 2011-07-18 NOTE — Telephone Encounter (Signed)
Pt has been constipated for 10 days now and has tried things OTC and drinking lots of water.  She is worried about this and wants to talk to nurse.  She can't come in b/c she is at [facility].

## 2011-07-18 NOTE — Telephone Encounter (Signed)
Agree with this approach. 

## 2011-07-18 NOTE — Telephone Encounter (Signed)
Patient has not had a BM in 10 days.  Took an appetite suppressant that was [facility] by [facility].  They warned her that it may cause constipation.  She only took 3 doses of it.    She has tried drinking lots of water and has taken six doses of Women's Correctol w/o results.  She denies pain, nausea and abdominal distention.  Recommended that she try a Dulcolax suppository tonight and if nor results call me back tomorrow.  Patient agreeable.

## 2011-08-30 LAB — BASIC METABOLIC PANEL
Calcium: 11 — ABNORMAL HIGH
GFR calc Af Amer: 54 — ABNORMAL LOW
GFR calc non Af Amer: 45 — ABNORMAL LOW
Sodium: 140

## 2011-08-30 LAB — DIFFERENTIAL
Eosinophils Absolute: 0
Eosinophils Absolute: 0.1
Lymphocytes Relative: 15
Lymphocytes Relative: 29
Lymphs Abs: 1.7
Lymphs Abs: 2.3
Neutro Abs: 4.9
Neutrophils Relative %: 60
Neutrophils Relative %: 73

## 2011-08-30 LAB — HEMOGLOBIN AND HEMATOCRIT, BLOOD
HCT: 37.5
HCT: 39.4
Hemoglobin: 12.9
Hemoglobin: 13.4

## 2011-08-30 LAB — CBC
Platelets: 159
Platelets: 186
WBC: 11.2 — ABNORMAL HIGH
WBC: 8.2

## 2011-08-31 LAB — DIFFERENTIAL
Basophils Absolute: 0
Lymphocytes Relative: 34
Monocytes Absolute: 1
Neutro Abs: 3.6

## 2011-08-31 LAB — CBC
Hemoglobin: 11.5 — ABNORMAL LOW
RBC: 3.58 — ABNORMAL LOW
RDW: 13.3

## 2011-09-05 LAB — BASIC METABOLIC PANEL
BUN: 16
Chloride: 102
Potassium: 4.2
Sodium: 136

## 2011-09-05 LAB — DIFFERENTIAL
Basophils Absolute: 0
Basophils Relative: 0
Eosinophils Absolute: 0.1
Eosinophils Relative: 2
Lymphocytes Relative: 38
Lymphs Abs: 1.9
Monocytes Absolute: 0.6
Monocytes Relative: 12
Neutro Abs: 2.4
Neutrophils Relative %: 49

## 2011-09-05 LAB — CBC
HCT: 39.2
Hemoglobin: 13.2
MCHC: 33.6
MCV: 95.5
Platelets: 131 — ABNORMAL LOW
RBC: 4.11
RDW: 15.7 — ABNORMAL HIGH
WBC: 5

## 2011-09-05 LAB — BASIC METABOLIC PANEL WITH GFR
CO2: 22
Calcium: 10.4
Creatinine, Ser: 1.04
GFR calc Af Amer: >60
GFR calc non Af Amer: 55 — ABNORMAL LOW
Glucose, Bld: 96

## 2011-09-05 LAB — URINE CULTURE: Colony Count: 9000

## 2011-11-23 ENCOUNTER — Ambulatory Visit (INDEPENDENT_AMBULATORY_CARE_PROVIDER_SITE_OTHER): Payer: BC Managed Care – PPO | Admitting: Family Medicine

## 2011-11-23 ENCOUNTER — Encounter: Payer: Self-pay | Admitting: Family Medicine

## 2011-11-23 VITALS — BP 124/82 | HR 61 | Ht 64.6 in | Wt 184.0 lb

## 2011-11-23 DIAGNOSIS — Z23 Encounter for immunization: Secondary | ICD-10-CM

## 2011-11-23 DIAGNOSIS — F329 Major depressive disorder, single episode, unspecified: Secondary | ICD-10-CM

## 2011-11-23 DIAGNOSIS — F3289 Other specified depressive episodes: Secondary | ICD-10-CM

## 2011-11-23 MED ORDER — LORAZEPAM 0.5 MG PO TABS: 0.5000 mg | ORAL_TABLET | Freq: Two times a day (BID) | ORAL | Status: AC | PRN

## 2011-11-23 MED ORDER — PAROXETINE HCL 20 MG PO TABS: 20.0000 mg | ORAL_TABLET | ORAL | Status: DC

## 2011-11-23 NOTE — Assessment & Plan Note (Signed)
Markedly worse with large situational component.

## 2011-11-23 NOTE — Progress Notes (Signed)
  Subjective:    Patient ID: Christina Hardin, female    DOB: 11-03-1951, 60 y.o.   MRN: [id number]  HPI  Very depressed plus some panic attacks.  Situational with the following factors: 1. Husband alcoholic who has returned to active drinking.  He is likely to die soon.  She lives with him and will likely separate when sufficient funds. 2. 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 3. Work is particularly tough.  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 4. Mother in nursing home with dementia No SI or HI    Review of Systems     Objective:   Physical Exam Appropriate affect.  Teary at times but rational and in control.        Assessment & Plan:

## 2011-11-23 NOTE — Patient Instructions (Signed)
The paroxetine (paxil) is the antidepressant which you take every day.  It may take a few weeks to kick in. The lorazapam is the nerve pill which you take as needed.  Hopefully, we can get rid of this medicine once the antidepressant kicks in.

## 2012-01-18 ENCOUNTER — Other Ambulatory Visit: Payer: Self-pay | Admitting: Obstetrics and Gynecology

## 2012-01-18 DIAGNOSIS — Z1231 Encounter for screening mammogram for malignant neoplasm of breast: Secondary | ICD-10-CM

## 2012-02-07 ENCOUNTER — Ambulatory Visit
Admission: RE | Admit: 2012-02-07 | Discharge: 2012-02-07 | Disposition: A | Discharge status: Home / Self Care | Payer: BC Managed Care – PPO | Source: Ambulatory Visit | Attending: Obstetrics and Gynecology | Admitting: Obstetrics and Gynecology

## 2012-02-07 DIAGNOSIS — Z1231 Encounter for screening mammogram for malignant neoplasm of breast: Secondary | ICD-10-CM

## 2012-03-12 ENCOUNTER — Encounter: Payer: Self-pay | Admitting: Family Medicine

## 2012-04-04 ENCOUNTER — Telehealth: Payer: Self-pay | Admitting: Family Medicine

## 2012-04-04 MED ORDER — LORAZEPAM 0.5 MG PO TABS: 0.5000 mg | ORAL_TABLET | Freq: Two times a day (BID) | ORAL | Status: AC | PRN

## 2012-04-04 NOTE — Telephone Encounter (Signed)
Christina Hardin would new rx sent to [facility] on [address] for her Lorazepam instead of getting it from [place].  Much cheaper with [facility].

## 2012-04-28 ENCOUNTER — Other Ambulatory Visit: Payer: Self-pay | Admitting: Family Medicine

## 2012-05-02 ENCOUNTER — Encounter: Payer: Self-pay | Admitting: Family Medicine

## 2012-05-02 DIAGNOSIS — K635 Polyp of colon: Secondary | ICD-10-CM | POA: Insufficient documentation

## 2012-05-05 ENCOUNTER — Encounter: Payer: Self-pay | Admitting: Family Medicine

## 2012-11-09 ENCOUNTER — Other Ambulatory Visit: Payer: Self-pay | Admitting: Family Medicine

## 2012-11-10 ENCOUNTER — Other Ambulatory Visit: Payer: Self-pay | Admitting: Family Medicine

## 2012-12-19 ENCOUNTER — Telehealth (INDEPENDENT_AMBULATORY_CARE_PROVIDER_SITE_OTHER): Payer: Self-pay | Admitting: Surgery

## 2012-12-19 NOTE — Telephone Encounter (Signed)
12/19/12 spoke to pt about scheduling a bariatric surgery follow-up appt with Dr. Ezzard Standing. She stated she "would like to call us back at a later time". Also mailed a recall letter as a reminder. Pt had RNY 06/14/08. (lss)

## 2013-02-13 ENCOUNTER — Other Ambulatory Visit: Payer: Self-pay

## 2013-02-13 DIAGNOSIS — Z1231 Encounter for screening mammogram for malignant neoplasm of breast: Secondary | ICD-10-CM

## 2013-02-16 ENCOUNTER — Ambulatory Visit: Payer: BC Managed Care – PPO

## 2013-03-12 ENCOUNTER — Ambulatory Visit
Admission: RE | Admit: 2013-03-12 | Discharge: 2013-03-12 | Disposition: A | Discharge status: Home / Self Care | Payer: BC Managed Care – PPO | Source: Ambulatory Visit

## 2013-03-12 DIAGNOSIS — Z1231 Encounter for screening mammogram for malignant neoplasm of breast: Secondary | ICD-10-CM

## 2013-04-30 ENCOUNTER — Other Ambulatory Visit: Payer: Self-pay | Admitting: Family Medicine

## 2013-05-03 ENCOUNTER — Other Ambulatory Visit: Payer: Self-pay | Admitting: Family Medicine

## 2013-05-27 ENCOUNTER — Other Ambulatory Visit: Payer: Self-pay | Admitting: Family Medicine

## 2013-05-28 ENCOUNTER — Other Ambulatory Visit: Payer: Self-pay | Admitting: *Deleted

## 2013-05-28 MED ORDER — LORAZEPAM 0.5 MG PO TABS: ORAL_TABLET | ORAL | Status: DC

## 2013-05-28 NOTE — Telephone Encounter (Signed)
Called in Rx

## 2013-06-08 ENCOUNTER — Encounter: Payer: Self-pay | Admitting: Family Medicine

## 2013-06-08 ENCOUNTER — Ambulatory Visit (INDEPENDENT_AMBULATORY_CARE_PROVIDER_SITE_OTHER): Payer: BC Managed Care – PPO | Admitting: Family Medicine

## 2013-06-08 VITALS — BP 114/71 | HR 60 | Temp 98.1°F | Ht 64.5 in | Wt 189.6 lb

## 2013-06-08 DIAGNOSIS — F3289 Other specified depressive episodes: Secondary | ICD-10-CM

## 2013-06-08 DIAGNOSIS — F329 Major depressive disorder, single episode, unspecified: Secondary | ICD-10-CM

## 2013-06-08 DIAGNOSIS — L719 Rosacea, unspecified: Secondary | ICD-10-CM

## 2013-06-08 MED ORDER — PAROXETINE HCL 20 MG PO TABS: 20.0000 mg | ORAL_TABLET | Freq: Every day | ORAL | Status: DC

## 2013-06-08 MED ORDER — METRONIDAZOLE 0.75 % EX CREA: TOPICAL_CREAM | Freq: Two times a day (BID) | CUTANEOUS | Status: DC

## 2013-06-08 NOTE — Progress Notes (Signed)
Patient ID: Christina Hardin, female   DOB: 04-07-1951, 62 y.o.   MRN: [id number]  Family Medicine Teaching Service Daily Progress Note Intern Pager: [phone]  Patient name: Christina Hardin Medical record number: [id number] Date of birth: 01-13-1951 Age: 62 y.o. Gender: female  Primary Care Provider: Sanjuana Letters, MD    Subjective:   [name] presents with a 3 month history of rosacea bilaterally on her cheeks. She has had previous episodes in the past and has been prescribed metrogel 1% in the past. She believes that her current eruption is from the stress of her husband undergoing surgery to remove his larynx due to cancer. The rosacea has a waxing and waning patter most of the time but has been worse than usual that past 3 months.   She also has increased feelings of depression since her husbands surgery.  She has had trouble falling asleep for the past 3 months and has started taking lorazepam twice nightly.  She has the lorazepam prescribed for her anxiety. She has been prescribed paroxetine in the past but quit taking it 6 months ago.  She feels exhausted but good appetite. No thoughts of hurting herself but feels guilty about her husband condition if she is not there to help him. She also feels at times as if she is the only one there helping him since her sons work.   Objective: Filed Vitals:   06/08/13 1335  Height: 5' 4.5" (1.638 m)  Weight: 86.002 kg (189 lb 9.6 oz)    Physical Exam: General: Well nourished.  Cardiovascular: RRR, S1S2 Respiratory: CTAB, no wheezes or crackles  Skin: bilateral erythematous papules are seen on her cheeks.    Assessment and Plan:  62 yo F that presents today with Rosacea and depression.   Rosacea: Metrocream 0.75% was sent to pharmacy and apply as directed.   Depression:  She has been treated with Paroxetine 20 mg daily in the past so we restarted that prescription.  We also began a taper on her lorazepam starting today.  She will  take one tablet nightly for the next week. Then on 06/15/13 she will take half of one tablet for one to three days.  If she still has problems falling asleep then we can consider other treatments such as trazodone or seroquil.    Also I told her to schedule a f/u in one month with her regular PCP to make sure the medication is working and she isn't having problems sleeping.    Clare Gandy, MD 06/08/2013, 2:31 PM PGY-1, [facility]

## 2013-06-08 NOTE — Patient Instructions (Addendum)
Thanks for meeting with me today.  I have prescribed the cream instead of the gel like you requested. Apply the cream as directed.   The lorazepam needs to be weaned off of.  Starting tonight take one tablet for the next week.  Then starting on 06/15/13, take half of a tablet for 1-3 days.  If you are still having problems with sleep call into the clinic and we can discuss further options.   Start taking the Paroxetine as directed the same day you fill the prescription.   Schedule a follow up in one month with you regular provider to make sure the new medication is working and you are getting enough sleep.   Thank you and nice to meet you.  Clare Gandy, MD

## 2013-09-11 ENCOUNTER — Ambulatory Visit (HOSPITAL_COMMUNITY)
Admission: RE | Admit: 2013-09-11 | Discharge: 2013-09-11 | Disposition: A | Discharge status: Home / Self Care | Payer: BC Managed Care – PPO | Source: Ambulatory Visit | Attending: Family Medicine | Admitting: Family Medicine

## 2013-09-11 ENCOUNTER — Encounter: Payer: Self-pay | Admitting: Family Medicine

## 2013-09-11 ENCOUNTER — Ambulatory Visit (INDEPENDENT_AMBULATORY_CARE_PROVIDER_SITE_OTHER): Payer: BC Managed Care – PPO | Admitting: Family Medicine

## 2013-09-11 VITALS — BP 127/76 | HR 68 | Temp 98.9°F | Ht 64.5 in | Wt 186.0 lb

## 2013-09-11 DIAGNOSIS — G609 Hereditary and idiopathic neuropathy, unspecified: Secondary | ICD-10-CM

## 2013-09-11 DIAGNOSIS — R209 Unspecified disturbances of skin sensation: Secondary | ICD-10-CM | POA: Insufficient documentation

## 2013-09-11 DIAGNOSIS — Z0000 Encounter for general adult medical examination without abnormal findings: Secondary | ICD-10-CM | POA: Insufficient documentation

## 2013-09-11 DIAGNOSIS — Z13820 Encounter for screening for osteoporosis: Secondary | ICD-10-CM

## 2013-09-11 DIAGNOSIS — F329 Major depressive disorder, single episode, unspecified: Secondary | ICD-10-CM

## 2013-09-11 DIAGNOSIS — M8580 Other specified disorders of bone density and structure, unspecified site: Secondary | ICD-10-CM | POA: Insufficient documentation

## 2013-09-11 DIAGNOSIS — I10 Essential (primary) hypertension: Secondary | ICD-10-CM

## 2013-09-11 DIAGNOSIS — R000 Tachycardia, unspecified: Secondary | ICD-10-CM | POA: Insufficient documentation

## 2013-09-11 DIAGNOSIS — R002 Palpitations: Secondary | ICD-10-CM

## 2013-09-11 DIAGNOSIS — Z23 Encounter for immunization: Secondary | ICD-10-CM

## 2013-09-11 DIAGNOSIS — F3289 Other specified depressive episodes: Secondary | ICD-10-CM

## 2013-09-11 DIAGNOSIS — Z780 Asymptomatic menopausal state: Secondary | ICD-10-CM

## 2013-09-11 DIAGNOSIS — G5600 Carpal tunnel syndrome, unspecified upper limb: Secondary | ICD-10-CM

## 2013-09-11 LAB — TSH: TSH: 0.769 u[IU]/mL (ref 0.350–4.500)

## 2013-09-11 LAB — CBC
HCT: 36.8 % (ref 36.0–46.0)
MCH: 29.5 pg (ref 26.0–34.0)
MCV: 85.6 fL (ref 78.0–100.0)
Platelets: 235 10*3/uL (ref 150–400)
RBC: 4.3 MIL/uL (ref 3.87–5.11)
RDW: 14.8 % (ref 11.5–15.5)

## 2013-09-11 LAB — LIPID PANEL
HDL: 39 mg/dL — ABNORMAL LOW (ref >39)
LDL Cholesterol: 71 mg/dL (ref 0–99)
Total CHOL/HDL Ratio: 3.4 Ratio

## 2013-09-11 LAB — COMPLETE METABOLIC PANEL WITH GFR
ALT: 13 U/L (ref 0–35)
AST: 22 U/L (ref 0–37)
Albumin: 3.9 g/dL (ref 3.5–5.2)
Alkaline Phosphatase: 71 U/L (ref 39–117)
Calcium: 9.4 mg/dL (ref 8.4–10.5)
Chloride: 100 mEq/L (ref 96–112)
Potassium: 3.3 mEq/L — ABNORMAL LOW (ref 3.5–5.3)

## 2013-09-11 LAB — VITAMIN B12: Vitamin B-12: >2000 pg/mL — ABNORMAL HIGH (ref 211–911)

## 2013-09-11 NOTE — Patient Instructions (Signed)
I will call Monday with the test results. I want to see you in one month to check the carpal tunnel symptoms and do a pap smear. Other treatments will depend on what the blood work shows. Remind me Monday to talk about restless leg syndrome. I am impressed by how you are coping.  Friends and staying active are obviously important.

## 2013-09-11 NOTE — Assessment & Plan Note (Signed)
A few things to catch up on.

## 2013-09-11 NOTE — Assessment & Plan Note (Signed)
Check TSH and give wrist splints.

## 2013-09-11 NOTE — Assessment & Plan Note (Signed)
Bone density

## 2013-09-11 NOTE — Progress Notes (Signed)
  Subjective:    Patient ID: LISVET RASHEED, female    DOB: 12/05/1950, 62 y.o.   MRN: [id number]  HPI For annual physical Husband died in 2023/08/04 after long illness She retired in Aug  Grieving.  Paroxitine helps.  So does lorazepam Spells of rapid heart.  No chest pain, light headedness or SOB.  Lasts only seconds. Having some numbness of both hands and feet.  Also hand discomfort. Needs pap and bone density.  Got flu today. Wt stable despite all stresses.     Review of Systems No CP, SOB, change in wt, appetite, bowel or bladder.  No bleeding or worrisome skin lesions.     Objective:   Physical Exam HEENT rosacea Neck supple Lungs clear Cardiac, RRR without m or g Abd benign Ext, + for carpal tunnel test.  Feet normal pulse and sensation.        Assessment & Plan:

## 2013-09-11 NOTE — Assessment & Plan Note (Signed)
Await lab results before RX

## 2013-09-11 NOTE — Assessment & Plan Note (Signed)
Seems like normal grief.

## 2013-09-11 NOTE — Assessment & Plan Note (Signed)
Good control

## 2013-09-14 ENCOUNTER — Telehealth: Payer: Self-pay | Admitting: Family Medicine

## 2013-09-14 DIAGNOSIS — F329 Major depressive disorder, single episode, unspecified: Secondary | ICD-10-CM

## 2013-09-14 DIAGNOSIS — F3289 Other specified depressive episodes: Secondary | ICD-10-CM

## 2013-09-14 NOTE — Telephone Encounter (Signed)
Patient call office to schedule her bone density screening and was told that the only thing ordered was a palpitation screening. Patient is wanting to know does Dr. Leveda Anna want her to have the both screenings or just the palpitation screening. Please call patient with this info, along with ladwork results, if available.

## 2013-09-14 NOTE — Telephone Encounter (Signed)
Left message on voicemail letting her know Dr Leveda Anna just requested Bone Density and to call back if she still had questions. Rosland Riding, Virgel Bouquet

## 2013-09-14 NOTE — Telephone Encounter (Signed)
Patient calls back, making sure that the order was called in. Because office will not let her schedule until new orders have been called in. Please call patient back when this has been done.

## 2013-09-15 NOTE — Addendum Note (Signed)
Addended by: Tanna Savoy on: 09/15/2013 10:43 AM   Modules accepted: Orders

## 2013-09-15 NOTE — Telephone Encounter (Signed)
Patient was informed  DX was changed on the Bone density test ,this will allow her to schedule appointment. Patient voiced understanding Christina Hardin, Virgel Bouquet

## 2013-09-22 NOTE — Telephone Encounter (Signed)
Pt called and would like to change her pharmacy to [facility]. She would also like a refill of Paroxetine called in. She said that [facility] has been faxing [place] request. JW

## 2013-09-23 ENCOUNTER — Ambulatory Visit
Admission: RE | Admit: 2013-09-23 | Discharge: 2013-09-23 | Disposition: A | Discharge status: Home / Self Care | Payer: BC Managed Care – PPO | Source: Ambulatory Visit | Attending: Family Medicine | Admitting: Family Medicine

## 2013-09-23 ENCOUNTER — Other Ambulatory Visit: Payer: Self-pay | Admitting: Family Medicine

## 2013-09-23 DIAGNOSIS — Z780 Asymptomatic menopausal state: Secondary | ICD-10-CM

## 2013-09-23 MED ORDER — PAROXETINE HCL 20 MG PO TABS: 20.0000 mg | ORAL_TABLET | Freq: Every day | ORAL | Status: DC

## 2013-10-09 ENCOUNTER — Encounter: Payer: Self-pay | Admitting: Family Medicine

## 2013-10-09 ENCOUNTER — Ambulatory Visit (INDEPENDENT_AMBULATORY_CARE_PROVIDER_SITE_OTHER): Payer: BC Managed Care – PPO | Admitting: Family Medicine

## 2013-10-09 ENCOUNTER — Ambulatory Visit
Admission: RE | Admit: 2013-10-09 | Discharge: 2013-10-09 | Disposition: A | Discharge status: Home / Self Care | Payer: BC Managed Care – PPO | Source: Ambulatory Visit | Attending: Family Medicine | Admitting: Family Medicine

## 2013-10-09 VITALS — BP 121/90 | HR 58 | Temp 98.2°F | Ht 64.0 in | Wt 189.7 lb

## 2013-10-09 DIAGNOSIS — M79646 Pain in unspecified finger(s): Secondary | ICD-10-CM

## 2013-10-09 DIAGNOSIS — M79644 Pain in right finger(s): Secondary | ICD-10-CM

## 2013-10-09 DIAGNOSIS — IMO0002 Reserved for concepts with insufficient information to code with codable children: Secondary | ICD-10-CM | POA: Insufficient documentation

## 2013-10-09 DIAGNOSIS — Z90710 Acquired absence of both cervix and uterus: Secondary | ICD-10-CM

## 2013-10-09 DIAGNOSIS — Z23 Encounter for immunization: Secondary | ICD-10-CM

## 2013-10-09 DIAGNOSIS — M8580 Other specified disorders of bone density and structure, unspecified site: Secondary | ICD-10-CM

## 2013-10-09 DIAGNOSIS — M899 Disorder of bone, unspecified: Secondary | ICD-10-CM

## 2013-10-09 DIAGNOSIS — M79609 Pain in unspecified limb: Secondary | ICD-10-CM

## 2013-10-09 DIAGNOSIS — G2581 Restless legs syndrome: Secondary | ICD-10-CM | POA: Insufficient documentation

## 2013-10-09 LAB — POCT SEDIMENTATION RATE: POCT SED RATE: 36 mm/hr — AB (ref 0–22)

## 2013-10-09 MED ORDER — ROPINIROLE HCL 0.5 MG PO TABS: 0.5000 mg | ORAL_TABLET | Freq: Every day | ORAL | Status: DC

## 2013-10-09 NOTE — Assessment & Plan Note (Signed)
Screen for inflamatory arthropathy.

## 2013-10-09 NOTE — Assessment & Plan Note (Signed)
Trial of medicine.

## 2013-10-09 NOTE — Progress Notes (Signed)
  Subjective:    Patient ID: Christina Hardin, female    DOB: 1951/11/23, 62 y.o.   MRN: [id number]  HPI In error, I thought she needed a pap smear.  I was able to access 2002 path report and confirm she had a hysterectomy and BSO.  Clearly labeled chart.    Slightly low K+ on lab.  Will increase dietary potassium She knows about osteopenia and is taking supplemental calcium.  Two additional issues: Restless leg syndrome.  Very good history.  Just affects her at night.  Wants a trial of meds. Bilateral thumb pain.  This may be different from the carpal tunnel syndrome that I am convinced she also has.  Also has bilateral knee pain.    Review of Systems     Objective:   Physical Exam Thumbs tender to palpation at MCP joints.  No swelling.        Assessment & Plan:

## 2013-10-09 NOTE — Patient Instructions (Addendum)
I will call with blood and x ray results. Try the requip for the restless leg.  A prescription was sent.  Ok to take two pills at night after one week if one pill is not working. You never need another pap smear and you did have both ovaries removed. Try to get a little more potassium in your diet: bananas and OJ are high in potassium. If things are going well I do not need to see you for 6 months.

## 2013-10-09 NOTE — Assessment & Plan Note (Signed)
On calcium

## 2013-10-12 ENCOUNTER — Encounter: Payer: Self-pay | Admitting: Family Medicine

## 2013-10-12 ENCOUNTER — Other Ambulatory Visit: Payer: Self-pay | Admitting: Family Medicine

## 2013-10-12 DIAGNOSIS — M79646 Pain in unspecified finger(s): Secondary | ICD-10-CM

## 2013-10-12 LAB — ANA: Anti Nuclear Antibody(ANA): NEGATIVE

## 2013-11-03 ENCOUNTER — Other Ambulatory Visit: Payer: Self-pay | Admitting: Family Medicine

## 2013-11-03 MED ORDER — HYDROCHLOROTHIAZIDE 25 MG PO TABS: ORAL_TABLET | ORAL | Status: DC

## 2013-12-01 ENCOUNTER — Other Ambulatory Visit: Payer: Self-pay | Admitting: Family Medicine

## 2013-12-01 DIAGNOSIS — F329 Major depressive disorder, single episode, unspecified: Secondary | ICD-10-CM

## 2013-12-01 DIAGNOSIS — F32A Depression, unspecified: Secondary | ICD-10-CM

## 2013-12-01 MED ORDER — LORAZEPAM 0.5 MG PO TABS: ORAL_TABLET | ORAL | Status: DC

## 2013-12-01 NOTE — Assessment & Plan Note (Signed)
Refilled per fax request

## 2014-02-11 ENCOUNTER — Other Ambulatory Visit: Payer: Self-pay

## 2014-02-11 DIAGNOSIS — Z803 Family history of malignant neoplasm of breast: Secondary | ICD-10-CM

## 2014-02-11 DIAGNOSIS — Z1231 Encounter for screening mammogram for malignant neoplasm of breast: Secondary | ICD-10-CM

## 2014-03-15 ENCOUNTER — Ambulatory Visit: Payer: BC Managed Care – PPO

## 2014-03-23 ENCOUNTER — Ambulatory Visit
Admission: RE | Admit: 2014-03-23 | Discharge: 2014-03-23 | Disposition: A | Discharge status: Home / Self Care | Payer: Self-pay | Source: Ambulatory Visit

## 2014-03-23 ENCOUNTER — Encounter (INDEPENDENT_AMBULATORY_CARE_PROVIDER_SITE_OTHER): Payer: Self-pay

## 2014-03-23 DIAGNOSIS — Z1231 Encounter for screening mammogram for malignant neoplasm of breast: Secondary | ICD-10-CM

## 2014-03-23 DIAGNOSIS — Z803 Family history of malignant neoplasm of breast: Secondary | ICD-10-CM

## 2014-04-29 ENCOUNTER — Telehealth: Payer: Self-pay | Admitting: Family Medicine

## 2014-04-29 DIAGNOSIS — M79646 Pain in unspecified finger(s): Secondary | ICD-10-CM

## 2014-04-29 DIAGNOSIS — M79609 Pain in unspecified limb: Secondary | ICD-10-CM

## 2014-04-29 NOTE — Telephone Encounter (Signed)
Put in another referral to Rheumatology. Just needing to know about the orthopedic surgeon

## 2014-04-29 NOTE — Telephone Encounter (Signed)
I suggest one consultant at a time.  See rheumatologist first.  If no help or if he suggests, I will be happy to put in ortho referral.

## 2014-04-29 NOTE — Telephone Encounter (Signed)
Spoke with patient and informed her of below 

## 2014-04-29 NOTE — Telephone Encounter (Signed)
Patient calls stating that she never heard from [facility] about her referral to them. Advised patient that referral was originally made in Nov 2014 and notes were faxed over in Dec 2014. [facility] was to contact patient after MD reviewed chart notes. She states she has been playing phone tag with [facility] and just received a call from them this week stating they never received a referral from [place]. Will need a new referral to [facility] due to original referral expiring in May 2015. She would also like to know if Dr. Andria Frames feels she should just see an orthopedic surgeon because her hand pain has gotten severely worse. Please call patient.

## 2014-05-24 ENCOUNTER — Other Ambulatory Visit: Payer: Self-pay | Admitting: Family Medicine

## 2014-06-23 ENCOUNTER — Other Ambulatory Visit: Payer: Self-pay | Admitting: Family Medicine

## 2014-06-23 DIAGNOSIS — F32A Depression, unspecified: Secondary | ICD-10-CM

## 2014-06-23 DIAGNOSIS — F329 Major depressive disorder, single episode, unspecified: Secondary | ICD-10-CM

## 2014-06-23 DIAGNOSIS — F419 Anxiety disorder, unspecified: Principal | ICD-10-CM

## 2014-06-23 MED ORDER — LORAZEPAM 0.5 MG PO TABS: ORAL_TABLET | ORAL | Status: DC

## 2014-07-26 ENCOUNTER — Ambulatory Visit (INDEPENDENT_AMBULATORY_CARE_PROVIDER_SITE_OTHER): Payer: BC Managed Care – PPO | Admitting: Family Medicine

## 2014-07-26 VITALS — BP 137/84 | HR 65 | Temp 98.1°F | Ht 64.5 in | Wt 203.8 lb

## 2014-07-26 DIAGNOSIS — L03319 Cellulitis of trunk, unspecified: Secondary | ICD-10-CM | POA: Diagnosis not present

## 2014-07-26 DIAGNOSIS — L02214 Cutaneous abscess of groin: Secondary | ICD-10-CM | POA: Insufficient documentation

## 2014-07-26 DIAGNOSIS — L02219 Cutaneous abscess of trunk, unspecified: Secondary | ICD-10-CM | POA: Diagnosis not present

## 2014-07-26 MED ORDER — CEPHALEXIN 500 MG PO CAPS: 500.0000 mg | ORAL_CAPSULE | Freq: Four times a day (QID) | ORAL | Status: DC

## 2014-07-26 MED ORDER — TRAMADOL HCL 50 MG PO TABS: 50.0000 mg | ORAL_TABLET | Freq: Four times a day (QID) | ORAL | Status: DC | PRN

## 2014-07-26 NOTE — Progress Notes (Signed)
Christina Hardin is a 63 y.o. female who presents today for abscess formation.  Pt states this began about one week ago and has gradually worsened.  She has never had this before and thought it might end up dissipating on its own.  She has tried warm compresses to the area 3 x per day for 20 minutes and has continued to grow.  Extremely tender and painful and surrounding erythema now ongoing for past day.  Did open up slightly about one day ago with some purulent drainage.  She denies fever, chills, sweats.   No past medical history on file.  History  Smoking status  . Never Smoker   Smokeless tobacco  . Not on file    No family history on file.  Current Outpatient Prescriptions on File Prior to Visit  Medication Sig Dispense Refill  . calcium-vitamin D (OSCAL WITH D) 500-200 MG-UNIT per tablet Take 1 tablet by mouth 2 (two) times daily.      . Cyanocobalamin (VITAMIN B-12) 2500 MCG SUBL Take 1 tablet by mouth daily.       Marland Kitchen docusate sodium (COLACE) 100 MG capsule Take 200 mg by mouth daily.      . hydrochlorothiazide (HYDRODIURIL) 25 MG tablet TAKE ONE TABLET BY MOUTH EVERY DAY IN THE MORNING  90 tablet  3  . LORazepam (ATIVAN) 0.5 MG tablet TAKE ONE TABLET BY MOUTH TWICE DAILY AS NEEDED FOR ANXIETY  60 tablet  5  . metroNIDAZOLE (METROCREAM) 0.75 % cream Apply topically 2 (two) times daily.  45 g  0  . Multiple Vitamin (MULTIVITAMIN) capsule Take 1 capsule by mouth daily.      Marland Kitchen PARoxetine (PAXIL) 20 MG tablet Take 1 tablet (20 mg total) by mouth daily.  90 tablet  3  . POLYETHYLENE GLYCOL 3350 PO 17 g by mouth once daily as needed constipation. Disp 1 large container       . rOPINIRole (REQUIP) 0.5 MG tablet TAKE 1 TABLET BY MOUTH AT BEDTIME.  90 tablet  3  . tramadol-acetaminophen (ULTRACET) 37.5-325 MG per tablet Take 1 tablet by mouth 4 (four) times daily as needed. For pain        No current facility-administered medications on file prior to visit.    ROS: Per HPI.  All other  systems reviewed and are negative.   Physical Exam Filed Vitals:   07/26/14 1031  BP: 137/84  Pulse: 65  Temp: 98.1 F (36.7 C)    Physical Examination: General appearance - alert, well appearing, and in no distress Abdomen - soft, nontender, nondistended, no masses or organomegaly Skin - + 3 x 2 cm abscess formation inguinal fold on the R side w/ 1 cm circumferential erythema     Chemistry      Component Value Date/Time   NA 139 09/11/2013 1031   K 3.3* 09/11/2013 1031   CL 100 09/11/2013 1031   CO2 29 09/11/2013 1031   BUN 18 09/11/2013 1031   CREATININE 0.93 09/11/2013 1031   CREATININE 0.78 09/06/2008      Component Value Date/Time   CALCIUM 9.4 09/11/2013 1031   ALKPHOS 71 09/11/2013 1031   AST 22 09/11/2013 1031   ALT 13 09/11/2013 1031   BILITOT 0.5 09/11/2013 1031      Lab Results  Component Value Date   HGBA1C 5.6 09/11/2013

## 2014-07-26 NOTE — Patient Instructions (Signed)

## 2014-07-26 NOTE — Assessment & Plan Note (Signed)
Incision and Drainage Procedure Note  Pre-operative Diagnosis: Inguinal R sided abscess/carbuncle   Post-operative Diagnosis: same  Indications: Above   Anesthesia: 2% plain lidocaine  Procedure Details  The procedure, risks and complications have been discussed in detail (including, but not limited to airway compromise, infection, bleeding) with the patient, and the patient has signed consent to the procedure.  The skin was sterilely prepped and draped over the affected area in the usual fashion. After adequate local anesthesia, I&D with a # 11blade was performed on the right suprapubic/inguinal region. Purulent drainage: present The patient was observed until stable.  She tolerated the procedure well   Findings: 10 cc of purulent drainage   EBL: 5 cc's  Drains: None   Condition: Tolerated procedure well   Complications: none.  Cx sent and started on Keflex 500 QID for 7 days.  Will see back in three days.  The I&D was packed with sterile packing and explained to pt to pull out tomorrow around 12 PM.  At that point, she may leave the area open or cover with band aid but want to prevent from walling off again.

## 2014-07-29 LAB — WOUND CULTURE: Gram Stain: NONE SEEN

## 2014-07-30 ENCOUNTER — Ambulatory Visit: Payer: BC Managed Care – PPO | Admitting: Family Medicine

## 2014-12-06 ENCOUNTER — Other Ambulatory Visit: Payer: Self-pay | Admitting: Family Medicine

## 2014-12-10 ENCOUNTER — Ambulatory Visit (INDEPENDENT_AMBULATORY_CARE_PROVIDER_SITE_OTHER): Payer: BC Managed Care – PPO | Admitting: Family Medicine

## 2014-12-10 ENCOUNTER — Encounter: Payer: Self-pay | Admitting: Family Medicine

## 2014-12-10 ENCOUNTER — Ambulatory Visit (INDEPENDENT_AMBULATORY_CARE_PROVIDER_SITE_OTHER): Payer: BC Managed Care – PPO | Admitting: *Deleted

## 2014-12-10 VITALS — BP 121/79 | HR 61 | Ht 64.5 in | Wt 176.0 lb

## 2014-12-10 DIAGNOSIS — I10 Essential (primary) hypertension: Secondary | ICD-10-CM | POA: Diagnosis not present

## 2014-12-10 DIAGNOSIS — M8580 Other specified disorders of bone density and structure, unspecified site: Secondary | ICD-10-CM

## 2014-12-10 DIAGNOSIS — F419 Anxiety disorder, unspecified: Principal | ICD-10-CM

## 2014-12-10 DIAGNOSIS — F329 Major depressive disorder, single episode, unspecified: Secondary | ICD-10-CM

## 2014-12-10 DIAGNOSIS — Z23 Encounter for immunization: Secondary | ICD-10-CM

## 2014-12-10 DIAGNOSIS — F418 Other specified anxiety disorders: Secondary | ICD-10-CM

## 2014-12-10 DIAGNOSIS — F32A Depression, unspecified: Secondary | ICD-10-CM

## 2014-12-10 LAB — LIPID PANEL
CHOL/HDL RATIO: 2.9 ratio
Cholesterol: 149 mg/dL (ref 0–200)
HDL: 51 mg/dL (ref >39)
LDL CALC: 76 mg/dL (ref 0–99)
Triglycerides: 109 mg/dL (ref <150)
VLDL: 22 mg/dL (ref 0–40)

## 2014-12-10 MED ORDER — LORAZEPAM 0.5 MG PO TABS: ORAL_TABLET | ORAL | Status: DC

## 2014-12-10 MED ORDER — PAROXETINE HCL 20 MG PO TABS: 20.0000 mg | ORAL_TABLET | Freq: Every day | ORAL | Status: DC

## 2014-12-10 NOTE — Assessment & Plan Note (Signed)
Restart meds.  Worse for obvious reasons.

## 2014-12-10 NOTE — Assessment & Plan Note (Signed)
Restart calcium and vit d
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
## 2014-12-10 NOTE — Assessment & Plan Note (Signed)
Well controled. 
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
## 2014-12-20 ENCOUNTER — Encounter: Payer: Self-pay | Admitting: Family Medicine

## 2014-12-20 DIAGNOSIS — Z9884 Bariatric surgery status: Secondary | ICD-10-CM | POA: Insufficient documentation

## 2014-12-20 HISTORY — DX: Bariatric surgery status: Z98.84

## 2015-02-28 ENCOUNTER — Other Ambulatory Visit: Payer: Self-pay

## 2015-02-28 DIAGNOSIS — Z1231 Encounter for screening mammogram for malignant neoplasm of breast: Secondary | ICD-10-CM

## 2015-03-17 ENCOUNTER — Other Ambulatory Visit: Payer: Self-pay | Admitting: *Deleted

## 2015-03-17 DIAGNOSIS — F329 Major depressive disorder, single episode, unspecified: Secondary | ICD-10-CM

## 2015-03-17 DIAGNOSIS — F32A Depression, unspecified: Secondary | ICD-10-CM

## 2015-03-17 DIAGNOSIS — F419 Anxiety disorder, unspecified: Principal | ICD-10-CM

## 2015-03-17 MED ORDER — LORAZEPAM 0.5 MG PO TABS: ORAL_TABLET | ORAL | Status: DC

## 2015-03-25 ENCOUNTER — Ambulatory Visit: Payer: BC Managed Care – PPO

## 2015-07-01 ENCOUNTER — Ambulatory Visit
Admission: RE | Admit: 2015-07-01 | Discharge: 2015-07-01 | Disposition: A | Discharge status: Home / Self Care | Payer: BC Managed Care – PPO | Source: Ambulatory Visit

## 2015-07-01 DIAGNOSIS — Z1231 Encounter for screening mammogram for malignant neoplasm of breast: Secondary | ICD-10-CM

## 2015-08-10 ENCOUNTER — Other Ambulatory Visit: Payer: Self-pay | Admitting: Family Medicine

## 2015-12-04 DIAGNOSIS — Z9221 Personal history of antineoplastic chemotherapy: Secondary | ICD-10-CM

## 2015-12-04 HISTORY — DX: Personal history of antineoplastic chemotherapy: Z92.21

## 2015-12-27 ENCOUNTER — Other Ambulatory Visit: Payer: Self-pay | Admitting: Family Medicine

## 2016-05-24 ENCOUNTER — Telehealth: Payer: Self-pay | Admitting: Family Medicine

## 2016-05-24 ENCOUNTER — Encounter: Payer: Self-pay | Admitting: Family Medicine

## 2016-05-24 ENCOUNTER — Ambulatory Visit (HOSPITAL_COMMUNITY)
Admission: RE | Admit: 2016-05-24 | Discharge: 2016-05-24 | Disposition: A | Discharge status: Home / Self Care | Payer: Medicare Other | Source: Ambulatory Visit | Attending: Family Medicine | Admitting: Family Medicine

## 2016-05-24 ENCOUNTER — Ambulatory Visit (INDEPENDENT_AMBULATORY_CARE_PROVIDER_SITE_OTHER): Payer: Medicare Other | Admitting: Family Medicine

## 2016-05-24 VITALS — BP 148/86 | HR 62 | Temp 98.2°F | Ht 65.0 in | Wt 194.0 lb

## 2016-05-24 DIAGNOSIS — Z114 Encounter for screening for human immunodeficiency virus [HIV]: Secondary | ICD-10-CM

## 2016-05-24 DIAGNOSIS — R002 Palpitations: Secondary | ICD-10-CM | POA: Diagnosis not present

## 2016-05-24 DIAGNOSIS — I10 Essential (primary) hypertension: Secondary | ICD-10-CM

## 2016-05-24 DIAGNOSIS — I712 Thoracic aortic aneurysm, without rupture, unspecified: Secondary | ICD-10-CM

## 2016-05-24 DIAGNOSIS — R55 Syncope and collapse: Secondary | ICD-10-CM

## 2016-05-24 DIAGNOSIS — Z1159 Encounter for screening for other viral diseases: Secondary | ICD-10-CM | POA: Insufficient documentation

## 2016-05-24 DIAGNOSIS — R911 Solitary pulmonary nodule: Secondary | ICD-10-CM | POA: Insufficient documentation

## 2016-05-24 DIAGNOSIS — D509 Iron deficiency anemia, unspecified: Secondary | ICD-10-CM

## 2016-05-24 LAB — D-DIMER, QUANTITATIVE: D-Dimer, Quant: 0.92 ug/mL-FEU — ABNORMAL HIGH (ref 0.00–0.50)

## 2016-05-24 LAB — COMPLETE METABOLIC PANEL WITH GFR
ALBUMIN: 3.8 g/dL (ref 3.6–5.1)
ALT: 7 U/L (ref 6–29)
AST: 15 U/L (ref 10–35)
Alkaline Phosphatase: 72 U/L (ref 33–130)
BILIRUBIN TOTAL: 0.4 mg/dL (ref 0.2–1.2)
BUN: 34 mg/dL — ABNORMAL HIGH (ref 7–25)
CALCIUM: 9.9 mg/dL (ref 8.6–10.4)
CO2: 29 mmol/L (ref 20–31)
Chloride: 102 mmol/L (ref 98–110)
Creat: 0.98 mg/dL (ref 0.50–0.99)
GFR, EST AFRICAN AMERICAN: 70 mL/min (ref >=60)
GFR, Est Non African American: 61 mL/min (ref >=60)
Glucose, Bld: 87 mg/dL (ref 65–99)
Potassium: 3.5 mmol/L (ref 3.5–5.3)
Sodium: 141 mmol/L (ref 135–146)
Total Protein: 7 g/dL (ref 6.1–8.1)

## 2016-05-24 LAB — CBC
HEMATOCRIT: 34.6 % — AB (ref 35.0–45.0)
HEMOGLOBIN: 10.9 g/dL — AB (ref 11.7–15.5)
MCH: 25.1 pg — ABNORMAL LOW (ref 27.0–33.0)
MCHC: 31.5 g/dL — AB (ref 32.0–36.0)
MCV: 79.7 fL — ABNORMAL LOW (ref 80.0–100.0)
MPV: 11 fL (ref 7.5–12.5)
Platelets: 274 10*3/uL (ref 140–400)
RBC: 4.34 MIL/uL (ref 3.80–5.10)
RDW: 16.3 % — AB (ref 11.0–15.0)
WBC: 7.7 10*3/uL (ref 3.8–10.8)

## 2016-05-24 LAB — POCT I-STAT CREATININE: Creatinine, Ser: 0.9 mg/dL (ref 0.44–1.00)

## 2016-05-24 LAB — TSH: TSH: 0.81 mIU/L

## 2016-05-24 MED ORDER — IOPAMIDOL (ISOVUE-370) INJECTION 76%
100.0000 mL | Freq: Once | INTRAVENOUS | Status: AC | PRN
  Administered 2016-05-24: 100 mL via INTRAVENOUS

## 2016-05-24 NOTE — Assessment & Plan Note (Signed)
Screen while I am doing blood work.

## 2016-05-24 NOTE — Telephone Encounter (Signed)
Received call from [place] at [facility] regarding CTA chest results and permission to send patient home. Patient was sent for CTA today by PCP to rule out PE. Imaging was negative for PE, but did show an aneurysm of the ascending thoracic aorta with recommendation for semi annual follow up. Patient otherwise stable. I gave permission for the patient to be discharged home.  [name]. Jerline Pain, [place] Resident PGY-2 05/24/2016 6:12 PM

## 2016-05-24 NOTE — Assessment & Plan Note (Signed)
Big diff:  Sounds vasovagal but no clear instigating cause. PE is a consideration, with travel.  Update: CT chest angio ordered due to elevated d dimer Primary cardiac arrythmia.  Also get holter.

## 2016-05-24 NOTE — Assessment & Plan Note (Signed)
Doubt overtreatment causing hypotension.  Hold HCTZ until clinical situation clarified.

## 2016-05-24 NOTE — Assessment & Plan Note (Signed)
Also see near syncope.  Given palpitations and low blood pressure, primary arythmia is in diff dx.  Needs Holter.  Hx of hypokalemia.  Recheck in that could predispose to arrythmia.

## 2016-05-24 NOTE — Addendum Note (Signed)
Addended by: Zenia Resides on: 05/24/2016 04:09 PM   Modules accepted: Orders

## 2016-05-24 NOTE — Progress Notes (Signed)
   Subjective:    Patient ID: Christina Hardin, female    DOB: Sep 05, 1951, 65 y.o.   MRN: [id number]  HPI Concerning symptoms of multiple "spells" Feels like near syncope.  Associated with nausea, sweating an mild SOB.  First spell was when traveling to [facility] by plane.  Lasted about 45 minutes.  Since then has had multiple spells at home - sometimes at rest.  One time, checked blood pressure with wrist monitor and found to be low.  Per that same wrist monitor, her home BPs have been running great 120's/60's.  No leg swelling.  Does have a sense of palpitations during spells.  Unclear if lightheaded and then palpitations or palpitations then lightheaded.  No chest pain.  Does have family history of a fib. No fever, cough, other systemic illness.  No hx of blood clots.    Review of Systems     Objective:   Physical Exam VS noted Lungs clear Cardiac RRR without m or g Abd benign. Ext trace bilateral edema.        Assessment & Plan:

## 2016-05-24 NOTE — Patient Instructions (Signed)
Stop your HCTZ now.  The spells sound like low blood pressure. Look up vaso vagal syncope.  Your symptoms match nicely. I will call tomorrow with test results and we will discuss next steps.

## 2016-05-25 DIAGNOSIS — I712 Thoracic aortic aneurysm, without rupture, unspecified: Secondary | ICD-10-CM | POA: Insufficient documentation

## 2016-05-25 DIAGNOSIS — D509 Iron deficiency anemia, unspecified: Secondary | ICD-10-CM | POA: Insufficient documentation

## 2016-05-25 LAB — HEPATITIS C ANTIBODY: HCV Ab: NEGATIVE

## 2016-05-25 LAB — HIV ANTIBODY (ROUTINE TESTING W REFLEX): HIV: NONREACTIVE

## 2016-05-25 MED ORDER — FAMOTIDINE 40 MG PO TABS: 40.0000 mg | ORAL_TABLET | Freq: Every day | ORAL | Status: DC

## 2016-05-25 MED ORDER — POLYSACCHARIDE IRON COMPLEX 150 MG PO CAPS: 150.0000 mg | ORAL_CAPSULE | Freq: Every day | ORAL | Status: DC

## 2016-05-25 NOTE — Assessment & Plan Note (Signed)
Given lab results per phone.  Anemia is new.  Likely GI loss.  Denies indigestion.  Up to date on colon cancer screen.  Will start pepcid and iron.  FU in 3-4 weeks.

## 2016-05-25 NOTE — Addendum Note (Signed)
Addended by: Zenia Resides on: 05/25/2016 09:13 AM   Modules accepted: Orders

## 2016-05-31 ENCOUNTER — Telehealth: Payer: Self-pay | Admitting: Family Medicine

## 2016-05-31 DIAGNOSIS — R55 Syncope and collapse: Secondary | ICD-10-CM

## 2016-05-31 NOTE — Telephone Encounter (Signed)
I believe that I ordered a Holter monitor for the patient.  I still want that testing done.

## 2016-05-31 NOTE — Telephone Encounter (Signed)
Pt has not heard from cardiologist about heart monitor. Please advise

## 2016-05-31 NOTE — Telephone Encounter (Signed)
Dr. Andria Frames, the best thing to do is cancel the order and do a Cardiologist referral for a Holter. Ottis Stain, CMA

## 2016-06-01 NOTE — Telephone Encounter (Signed)
Referral entered.  I am unclear: previously, we were able to directly order a Holter.

## 2016-06-08 ENCOUNTER — Ambulatory Visit (INDEPENDENT_AMBULATORY_CARE_PROVIDER_SITE_OTHER): Payer: Medicare Other

## 2016-06-08 ENCOUNTER — Encounter: Payer: Self-pay | Admitting: Internal Medicine

## 2016-06-08 ENCOUNTER — Telehealth: Payer: Self-pay | Admitting: Internal Medicine

## 2016-06-08 ENCOUNTER — Ambulatory Visit (INDEPENDENT_AMBULATORY_CARE_PROVIDER_SITE_OTHER): Payer: Medicare Other | Admitting: Internal Medicine

## 2016-06-08 ENCOUNTER — Telehealth: Payer: Self-pay | Admitting: Physician Assistant

## 2016-06-08 VITALS — BP 126/92 | HR 83 | Ht 64.5 in | Wt 192.2 lb

## 2016-06-08 DIAGNOSIS — R002 Palpitations: Secondary | ICD-10-CM | POA: Diagnosis not present

## 2016-06-08 DIAGNOSIS — I712 Thoracic aortic aneurysm, without rupture, unspecified: Secondary | ICD-10-CM | POA: Insufficient documentation

## 2016-06-08 DIAGNOSIS — R55 Syncope and collapse: Secondary | ICD-10-CM

## 2016-06-08 DIAGNOSIS — I10 Essential (primary) hypertension: Secondary | ICD-10-CM | POA: Diagnosis not present

## 2016-06-08 NOTE — Patient Instructions (Signed)
Medication Instructions:  No Changes  Labwork: None  Testing/Procedures: Your physician has recommended that you wear an event monitor for 2 weeks. Event monitors are medical devices that record the heart's electrical activity. Doctors most often [place] these monitors to diagnose arrhythmias. Arrhythmias are problems with the speed or rhythm of the heartbeat. The monitor is a small, portable device. You can wear one while you do your normal daily activities. This is usually used to diagnose what is causing palpitations/syncope (passing out).  Your physician has requested that you have an echocardiogram. Echocardiography is a painless test that uses sound waves to create images of your heart. It provides your doctor with information about the size and shape of your heart and how well your heart's chambers and valves are working. This procedure takes approximately one hour. There are no restrictions for this procedure.  Follow-Up: Your physician recommends that you schedule a follow-up appointment in: after testing    Any Other Special Instructions Will Be Listed Below (If Applicable).     If you need a refill on your cardiac medications before your next appointment, please call your pharmacy.

## 2016-06-08 NOTE — Telephone Encounter (Signed)
    I was paged on after hours line about a critical ECG. No return number was provided. I looked at office note from today and looks like a heart monitor was placed> ECG today stable.   Angelena Form PA-C  [facility]

## 2016-06-08 NOTE — Telephone Encounter (Signed)
On Call Cardiology  Preventive service called [phone]. Event monitor showed 45 sec Atrial flutter followed by Sinus rhythm and  PACs.  Wandra Mannan, MD Cardiology

## 2016-06-08 NOTE — Progress Notes (Signed)
OFFICE NOTE  Chief Complaint:  Presyncope, DOE, fatigue  Primary Care Physician: Zigmund Gottron, MD  HPI:  Christina Hardin is a 65 y.o. female who is coming referred to me by Dr. Andria Frames for evaluation of presyncope, dyspnea and exertion and fatigue. Recently she was on an airline flight and had an episode where she felt hot and flushed and like she was going to pass out on the airplane. They laid her down and a physician on the flight treated her with oxygen and elevating her feet. Ultimately her symptoms improve. Since that time she's had a couple of other episodes of presyncope, and feeling that she was short of breath and fatigue. She reportedly had 2 episodes yesterday. She may report some racing of her heart during these episodes. She denies any chest pain. She does feel like sometimes she has to take a deep breath. She's also been more fatigued than usual. She says she has a history of very poor sleep and only gets 3-4 hours of sleep at night. In the past she went for sleep study but did not fall sleep long enough for a good reading. She apparently also has mild RA but is only on meloxicam. Because of her recent plane flight a CT scan was ordered to rule out pulmonary embolus. 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 Family history is not significant for coronary disease however her mother does have atrial fibrillation.  PMHx:  Past Medical History  Diagnosis Date  . Rheumatoid arthritis ([place])   . Hypertension   . Thoracic aortic aneurysm ([place])   . Gastric bypass status for obesity     No past surgical history on file.  FAMHx:  Family History  Problem Relation Age of Onset  . Atrial fibrillation Mother   . Other Mother     spinal stenosis  . Dementia Mother   . Hypertension Mother   . Gout Mother   . Arthritis Mother   . COPD Father   . Emphysema Father   . Lung cancer Father   . Hypertension Sister   . Sleep  apnea Sister   . Healthy Brother   . Healthy Brother     SOCHx:   reports that she has never smoked. She has never used smokeless tobacco. Her alcohol and drug histories are not on file.  ALLERGIES:  Allergies  Allergen Reactions  . Propoxyphene Hcl     REACTION: unspecified    ROS: Pertinent items noted in HPI and remainder of comprehensive ROS otherwise negative.  HOME MEDS: Current Outpatient Prescriptions  Medication Sig Dispense Refill  . Cyanocobalamin (VITAMIN B-12) 2500 MCG SUBL Take 1 tablet by mouth daily. Reported on 06/08/2016    . famotidine (PEPCID) 40 MG tablet Take 1 tablet (40 mg total) by mouth daily. 90 tablet 3  . hydrochlorothiazide (HYDRODIURIL) 25 MG tablet TAKE 1 TABLET BY MOUTH EVERY MORNING. 90 tablet 3  . Ibuprofen-Diphenhydramine Cit (ADVIL PM PO) Take 2 capsules by mouth at bedtime as needed (sleep).    . iron polysaccharides (NIFEREX) 150 MG capsule Take 1 capsule (150 mg total) by mouth daily. 90 capsule 1  . meloxicam (MOBIC) 15 MG tablet Take 15 mg by mouth daily. Reported on 06/08/2016    . metroNIDAZOLE (METROCREAM) 0.75 % cream Apply 1 application topically 2 (two) times daily as needed (rosacea).    . Multiple Vitamin (MULTIVITAMIN) capsule Take 1 capsule by mouth daily. Reported on  06/08/2016    . OVER THE COUNTER MEDICATION Take 1 capsule by mouth daily. Med Name: VEGGIE CAPSULE    . OVER THE COUNTER MEDICATION Take 900 mg by mouth daily. Med Name: TUMERIC WITH CURCUMIN    . POLYETHYLENE GLYCOL 3350 PO Take 17 g by mouth daily as needed (constipation). Reported on 06/08/2016    . Potassium 99 MG TABS Take 99 mg by mouth daily.     No current facility-administered medications for this visit.    LABS/IMAGING: No results found for this or any previous visit (from the past 48 hour(s)). No results found.  WEIGHTS: Wt Readings from Last 3 Encounters:  06/08/16 192 lb 3.2 oz (87.181 kg)  05/24/16 194 lb (87.998 kg)  12/10/14 176 lb (79.833 kg)     VITALS: BP 126/92 mmHg  Pulse 83  Ht 5' 4.5" (1.638 m)  Wt 192 lb 3.2 oz (87.181 kg)  BMI 32.49 kg/m2  EXAM: General appearance: alert, no distress and mildly obese Neck: no carotid bruit and no JVD Lungs: clear to auscultation bilaterally Heart: regular rate and rhythm, S1, S2 normal, no murmur, click, rub or gallop Abdomen: soft, non-tender; bowel sounds normal; no masses,  no organomegaly Extremities: extremities normal, atraumatic, no cyanosis or edema, varicose veins noted and dependent venous congestion Pulses: 2+ and symmetric Skin: Skin color, texture, turgor normal. No rashes or lesions Neurologic: Grossly normal Psych: Mildly anxious  EKG: Normal sinus rhythm at 83  ASSESSMENT: 1. Presyncope 2. Dyspnea on exertion/fatigue 3. Hypertension 4. Thoracic aortic aneurysm  PLAN: 1.   [name] is had several episodes of presyncope which are concerning for possible arrhythmias. Specifically, I wonder she is having atrial fibrillation which could be associated with her fatigue. Her mother has A. fib as well. I like to place a monitor for 2 weeks as she seems to be having episodes every couple days. She's also been very fatigued recently. We'll get an echocardiogram to rule out any cardiomyopathy or structural changes. Ultimately she may need stress testing as she has some risk factors for coronary disease including very mild rheumatoid arthritis. Recently she was found to have a thoracic aortic aneurysm by CT scanning which was previously unknown. This will need follow-up in 6-12 months for stability.  Follow-up with me in a few weeks after the studies are complete. Thanks for the kind referral.  Pixie Casino, MD, [facility] Attending Cardiologist [place] 06/08/2016, 12:48 PM

## 2016-06-11 ENCOUNTER — Other Ambulatory Visit: Payer: Self-pay | Admitting: *Deleted

## 2016-06-11 MED ORDER — APIXABAN 5 MG PO TABS: 5.0000 mg | ORAL_TABLET | Freq: Two times a day (BID) | ORAL | Status: DC

## 2016-06-11 MED ORDER — METOPROLOL SUCCINATE ER 25 MG PO TB24: 12.5000 mg | ORAL_TABLET | Freq: Every day | ORAL | Status: DC

## 2016-06-11 NOTE — Addendum Note (Signed)
Addended by: Fidel Levy on: 06/11/2016 09:52 AM   Modules accepted: Orders

## 2016-06-11 NOTE — Telephone Encounter (Signed)
Monitor report apparently showed atrial flutter - this is likely the cause of her symptoms. Recommend starting Eliquis 5 mg BID and Toprol XL 12.5 mg daily. Will likely need ischemia evaluation prior to considering antiarrythmic therapy.  Thanks.  Dr. Lemmie Evens

## 2016-06-11 NOTE — Telephone Encounter (Signed)
Patient called and stated that she had spoke with the nurse earlier and that she was supposed to start on two new medications today. She went to the pharmacy and they did not receive any rx's. Thanks, MI

## 2016-06-11 NOTE — Telephone Encounter (Signed)
This was inadvertently routed to Dr Debara Pickett.

## 2016-06-11 NOTE — Telephone Encounter (Signed)
Rx(s) sent to pharmacy electronically. Apologized to patient that Rx(s) didn't got thru.  She has already picked up eliquis samples

## 2016-06-11 NOTE — Telephone Encounter (Addendum)
Called patient. Informed her of monitor findings. Informed her of MD advice regarding med changes. She voiced understanding and agreed with plan.   Metoprolol succinate 12.5mg  QD Rx(s) sent to pharmacy electronically. Eliquis Rx(s) sent to pharmacy electronically. Samples of eliquis [id number] Exp 08/2018 x2) and free 30 day voucher left for patient to pick up.

## 2016-06-21 ENCOUNTER — Ambulatory Visit (INDEPENDENT_AMBULATORY_CARE_PROVIDER_SITE_OTHER): Payer: Medicare Other | Admitting: Family Medicine

## 2016-06-21 ENCOUNTER — Encounter: Payer: Self-pay | Admitting: Family Medicine

## 2016-06-21 VITALS — BP 146/84 | HR 75 | Temp 97.8°F | Ht 65.0 in | Wt 194.0 lb

## 2016-06-21 DIAGNOSIS — I10 Essential (primary) hypertension: Secondary | ICD-10-CM | POA: Diagnosis not present

## 2016-06-21 DIAGNOSIS — Z23 Encounter for immunization: Secondary | ICD-10-CM

## 2016-06-21 DIAGNOSIS — I480 Paroxysmal atrial fibrillation: Secondary | ICD-10-CM

## 2016-06-21 DIAGNOSIS — D509 Iron deficiency anemia, unspecified: Secondary | ICD-10-CM

## 2016-06-21 NOTE — Assessment & Plan Note (Signed)
Per cards, eliquis and metoprolol.  Echo pending.

## 2016-06-21 NOTE — Assessment & Plan Note (Signed)
Not currently well controled.  Await echo.  If CHF, add ACE.  If no CHF, increase metoprolol

## 2016-06-21 NOTE — Progress Notes (Signed)
   Subjective:    Patient ID: Christina Hardin, female    DOB: 11-10-1951, 65 y.o.   MRN: [id number]  HPI FU lightheaded spells.  Per cards and monitor having PAF.  Now on eliquis and metoprolol. Low dose.  For echo tomorrow.  Has cards FU planned 2. HBP, held BP meds with lightheadedness.  A bit up today.  Will await echo before treating. 3. Needs pneumovax today 4. FE defiiciency.  I am treating with iron.  Concerned with starting eliquis.  No change in stools.  Up to date on colon cancer screen.   Review of Systems     Objective:   Physical Exam VS noted In regular rate and rhythm without m or g Lungs clear. Ext no edema.        Assessment & Plan:

## 2016-06-21 NOTE — Assessment & Plan Note (Signed)
Unlcear if malabsorbtion from gastric surg or if GI blood loss.  Blood loss is worrisome because would be worsened by eliquis. Recheck CBC in two weeks.  If improving, continue iron and monitor.  If worse, GI referral.

## 2016-06-21 NOTE — Patient Instructions (Signed)
I am glad we are getting to the bottom of this.  For the a fib - keep your appointment with Dr. Debara Pickett and the echocardiogram  For the hypertension, we will probably increase the metoprolol.  Depending on the results of the echo, there may be another high blood pressure drug that we might add.  Either Dr. Debara Pickett or I can do this.  For the anemia, come in 2-3 weeks for a blood count.  Call before for lab only visit.  Depending on the results, I may have you see a GI doctor.  Also watch for change in your stools on the eliquis.    Your cholesterol is great.  So, prudent exercise and diet are the best treatments for the aneurism at this point.

## 2016-06-22 ENCOUNTER — Ambulatory Visit (HOSPITAL_COMMUNITY): Payer: Medicare Other | Attending: Cardiovascular Disease

## 2016-06-22 ENCOUNTER — Other Ambulatory Visit: Payer: Self-pay

## 2016-06-22 ENCOUNTER — Ambulatory Visit (HOSPITAL_COMMUNITY): Payer: Medicare Other

## 2016-06-22 DIAGNOSIS — E669 Obesity, unspecified: Secondary | ICD-10-CM | POA: Diagnosis not present

## 2016-06-22 DIAGNOSIS — G4730 Sleep apnea, unspecified: Secondary | ICD-10-CM | POA: Insufficient documentation

## 2016-06-22 DIAGNOSIS — R55 Syncope and collapse: Secondary | ICD-10-CM | POA: Insufficient documentation

## 2016-06-22 DIAGNOSIS — R002 Palpitations: Secondary | ICD-10-CM | POA: Insufficient documentation

## 2016-06-22 DIAGNOSIS — I712 Thoracic aortic aneurysm, without rupture: Secondary | ICD-10-CM | POA: Diagnosis not present

## 2016-06-22 DIAGNOSIS — Z6832 Body mass index (BMI) 32.0-32.9, adult: Secondary | ICD-10-CM | POA: Insufficient documentation

## 2016-06-22 LAB — ECHOCARDIOGRAM COMPLETE
AOASC: 38 cm
E/e' ratio: 11.52
EWDT: 218 ms
FS: 29 % (ref 28–44)
IV/PV OW: 1.02
LA ID, A-P, ES: 36 mm
LA vol index: 29.9 mL/m2
LA vol: 61 mL
LADIAMINDEX: 1.76 cm/m2
LAVOLA4C: 68 mL
LEFT ATRIUM END SYS DIAM: 36 mm
LV PW d: 12.2 mm — AB (ref 0.6–1.1)
LV TDI E'LATERAL: 5.79
LV TDI E'MEDIAL: 3.95
LV dias vol index: 35 mL/m2
LV e' LATERAL: 5.79 cm/s
LVDIAVOL: 72 mL (ref 46–106)
LVEEAVG: 11.52
LVEEMED: 11.52
LVOT SV: 74 mL
LVOT VTI: 23.7 cm
LVOT area: 3.14 cm2
LVOT diameter: 20 mm
LVOT peak vel: 108 cm/s
LVSYSVOL: 31 mL (ref 14–42)
LVSYSVOLIN: 15 mL/m2
Lateral S' vel: 12.4 cm/s
MV Dec: 218
MVPKAVEL: 81.8 m/s
MVPKEVEL: 66.7 m/s
Simpson's disk: 57
Stroke v: 41 ml

## 2016-06-27 ENCOUNTER — Other Ambulatory Visit (HOSPITAL_COMMUNITY): Payer: Medicare Other

## 2016-07-04 ENCOUNTER — Telehealth: Payer: Self-pay | Admitting: Internal Medicine

## 2016-07-04 NOTE — Telephone Encounter (Signed)
Submitted PA for eliquis 5mg  BID via [email or web address] [id number]

## 2016-07-04 NOTE — Telephone Encounter (Signed)
eliquis 5mg  BID approved thru 12/02/2016 under Medicare Part D

## 2016-07-05 ENCOUNTER — Other Ambulatory Visit: Payer: Medicare Other

## 2016-07-05 DIAGNOSIS — D509 Iron deficiency anemia, unspecified: Secondary | ICD-10-CM

## 2016-07-05 LAB — CBC
HCT: 33.9 % — ABNORMAL LOW (ref 35.0–45.0)
Hemoglobin: 10.6 g/dL — ABNORMAL LOW (ref 11.7–15.5)
MCH: 25.1 pg — ABNORMAL LOW (ref 27.0–33.0)
MCHC: 31.3 g/dL — ABNORMAL LOW (ref 32.0–36.0)
MCV: 80.3 fL (ref 80.0–100.0)
MPV: 10.6 fL (ref 7.5–12.5)
PLATELETS: 244 10*3/uL (ref 140–400)
RBC: 4.22 MIL/uL (ref 3.80–5.10)
RDW: 17 % — AB (ref 11.0–15.0)
WBC: 6.7 10*3/uL (ref 3.8–10.8)

## 2016-07-06 ENCOUNTER — Encounter: Payer: Self-pay | Admitting: Internal Medicine

## 2016-07-06 ENCOUNTER — Ambulatory Visit (INDEPENDENT_AMBULATORY_CARE_PROVIDER_SITE_OTHER): Payer: Medicare Other | Admitting: Internal Medicine

## 2016-07-06 VITALS — BP 140/98 | HR 62 | Ht 64.5 in | Wt 193.5 lb

## 2016-07-06 DIAGNOSIS — I10 Essential (primary) hypertension: Secondary | ICD-10-CM

## 2016-07-06 DIAGNOSIS — I712 Thoracic aortic aneurysm, without rupture, unspecified: Secondary | ICD-10-CM

## 2016-07-06 DIAGNOSIS — I519 Heart disease, unspecified: Secondary | ICD-10-CM

## 2016-07-06 DIAGNOSIS — R55 Syncope and collapse: Secondary | ICD-10-CM | POA: Diagnosis not present

## 2016-07-06 DIAGNOSIS — D509 Iron deficiency anemia, unspecified: Secondary | ICD-10-CM

## 2016-07-06 DIAGNOSIS — I5189 Other ill-defined heart diseases: Secondary | ICD-10-CM

## 2016-07-06 DIAGNOSIS — I4892 Unspecified atrial flutter: Secondary | ICD-10-CM

## 2016-07-06 HISTORY — DX: Other ill-defined heart diseases: I51.89

## 2016-07-06 MED ORDER — METOPROLOL SUCCINATE ER 25 MG PO TB24: 25.0000 mg | ORAL_TABLET | Freq: Every day | ORAL | 3 refills | Status: DC

## 2016-07-06 NOTE — Progress Notes (Signed)
OFFICE NOTE  Chief Complaint:  Follow-up echo, monitor  Primary Care Physician: Zigmund Gottron, MD  HPI:  Christina Hardin is a 65 y.o. female who is coming referred to me by Dr. Andria Frames for evaluation of presyncope, dyspnea and exertion and fatigue. Recently she was on an airline flight and had an episode where she felt hot and flushed and like she was going to pass out on the airplane. They laid her down and a physician on the flight treated her with oxygen and elevating her feet. Ultimately her symptoms improve. Since that time she's had a couple of other episodes of presyncope, and feeling that she was short of breath and fatigue. She reportedly had 2 episodes yesterday. She may report some racing of her heart during these episodes. She denies any chest pain. She does feel like sometimes she has to take a deep breath. She's also been more fatigued than usual. She says she has a history of very poor sleep and only gets 3-4 hours of sleep at night. In the past she went for sleep study but did not fall sleep long enough for a good reading. She apparently also has mild RA but is only on meloxicam. Because of her recent plane flight a CT scan was ordered to rule out pulmonary embolus. 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 Family history is not significant for coronary disease however her mother does have atrial fibrillation.  07/06/2016  Christina Hardin returns today for follow-up. We placed her on a monitor which fairly quickly demonstrated atrial flutter. This is paroxysmal and she is quite symptomatic. I think the episodes where she was presyncopal or likely related to this. Her echo actually shows normal systolic function but grade 2 diastolic dysfunction. This makes sense. Since she is dependent on atrial cake for LV filling, when she goes into atrial flutter this is likely causing a marked decrease in cardiac output which causes her to be  presyncopal. She was previously on a diuretic which probably exacerbated the problem and fortunate Dr. Andria Frames stop this medication. I have started her on metoprolol with some improvement in her symptoms. She still notes some episodes of palpitations however they're less frequent and less severe. She's had no further presyncope. Blood pressure is actually elevated today at 140/98. Dr. Andria Frames also checked blood work yesterday for anemia and it appears that she is slightly more anemic despite being started on iron. This may be an effect of adding Eliquis that has unmasked some slow GI bleeding source.  PMHx:  Past Medical History:  Diagnosis Date  . Gastric bypass status for obesity   . Hypertension   . Rheumatoid arthritis ([place])   . Thoracic aortic aneurysm ([facility])     No past surgical history on file.  FAMHx:  Family History  Problem Relation Age of Onset  . Atrial fibrillation Mother   . Other Mother     spinal stenosis  . Dementia Mother   . Hypertension Mother   . Gout Mother   . Arthritis Mother   . COPD Father   . Emphysema Father   . Lung cancer Father   . Hypertension Sister   . Sleep apnea Sister   . Healthy Brother   . Healthy Brother     SOCHx:   reports that she has never smoked. She has never used smokeless tobacco. Her alcohol and drug histories are not on file.  ALLERGIES:  Allergies  Allergen Reactions  . Propoxyphene Hcl     REACTION: unspecified    ROS: Pertinent items noted in HPI and remainder of comprehensive ROS otherwise negative.  HOME MEDS: Current Outpatient Prescriptions  Medication Sig Dispense Refill  . apixaban (ELIQUIS) 5 MG TABS tablet Take 1 tablet (5 mg total) by mouth 2 (two) times daily. 60 tablet 5  . Calcium-Magnesium-Vitamin D (CALCIUM 500 PO) Take by mouth daily.    . Cyanocobalamin (VITAMIN B-12) 2500 MCG SUBL Take 1 tablet by mouth daily. Reported on 06/08/2016    . famotidine (PEPCID) 40 MG tablet Take 1 tablet (40 mg total) by  mouth daily. 90 tablet 3  . Ibuprofen-Diphenhydramine Cit (ADVIL PM PO) Take 2 capsules by mouth at bedtime as needed (sleep).    . iron polysaccharides (NIFEREX) 150 MG capsule Take 1 capsule (150 mg total) by mouth daily. 90 capsule 1  . meloxicam (MOBIC) 15 MG tablet Take 15 mg by mouth daily. Reported on 06/08/2016    . metoprolol succinate (TOPROL-XL) 25 MG 24 hr tablet Take 1 tablet (25 mg total) by mouth daily. 90 tablet 3  . metroNIDAZOLE (METROCREAM) 0.75 % cream Apply 1 application topically 2 (two) times daily as needed (rosacea).    . Multiple Vitamin (MULTIVITAMIN) capsule Take 1 capsule by mouth daily. Reported on 06/08/2016    . Potassium 99 MG TABS Take 99 mg by mouth daily.     No current facility-administered medications for this visit.     LABS/IMAGING: Results for orders placed or performed in visit on 07/05/16 (from the past 48 hour(s))  CBC     Status: Abnormal   Collection Time: 07/05/16 10:30 AM  Result Value Ref Range   WBC 6.7 3.8 - 10.8 K/uL   RBC 4.22 3.80 - 5.10 MIL/uL   Hemoglobin 10.6 (L) 11.7 - 15.5 g/dL   HCT 33.9 (L) 35.0 - 45.0 %   MCV 80.3 80.0 - 100.0 fL   MCH 25.1 (L) 27.0 - 33.0 pg   MCHC 31.3 (L) 32.0 - 36.0 g/dL   RDW 17.0 (H) 11.0 - 15.0 %   Platelets 244 140 - 400 K/uL   MPV 10.6 7.5 - 12.5 fL    Comment: ** Please note change in unit of measure and reference range(s). **   No results found.  WEIGHTS: Wt Readings from Last 3 Encounters:  07/06/16 193 lb 8 oz (87.8 kg)  06/21/16 194 lb (88 kg)  06/08/16 192 lb 3.2 oz (87.2 kg)    VITALS: BP (!) 140/98 (BP Location: Left Arm, Patient Position: Sitting, Cuff Size: Large)   Pulse 62   Ht 5' 4.5" (1.638 m)   Wt 193 lb 8 oz (87.8 kg)   SpO2 98%   BMI 32.70 kg/m   EXAM: Deferred  EKG: Deferred  ASSESSMENT: 1. Presyncope - related to paroxysmal atrial flutter 2. Normal LVEF with grade 2 DD, normal LA size 3. Dyspnea on exertion - related to diastolic  dysfunction 4. Hypertension 5. Thoracic aortic aneurysm (4.5 cm - 2017) 6. Paroxysmal atrial flutter - CHADSVASC score of 4  PLAN: 1.   Christina Hardin was found to have paroxysmal atrial flutter. I started her on metoprolol with significant improvement in her palpitations however she still has some intermittent events. She's not had any more presyncope. I think this is related to diastolic dysfunction, loss of atrial kick and being on a diuretic. Blood pressure is still elevated. I think there is room to increase her beta  blocker further which should increase filling time. Will increase her metoprolol to 25 mg XL daily. This should also be good therapy for decreasing the risk of expansion of her thoracic aortic aneurysm. With regards to her atrial flutter, her CHADSVASC score is 4 based on age, female gender, hypertension and aortic aneurysm/PAD. She is on Eliquis however there is evidence for some progressive anemia, suggesting that she may be having chronic blood loss-possibly from a GI source. I've advised her to follow-up with her primary care provider for stool cards and probable GI evaluation.   Follow-up with me in 6 months.   Pixie Casino, MD, [facility] Attending Cardiologist Wendell C Cisco Kindt 07/06/2016, 9:49 AM

## 2016-07-06 NOTE — Patient Instructions (Addendum)
Your physician has recommended you make the following change in your medication: INCREASE metoprolol succinate to 25mg  once daily   Your physician wants you to follow-up in: 6 months with Dr. Debara Pickett. You will receive a reminder letter in the mail two months in advance. If you don't receive a letter, please call our office to schedule the follow-up appointment.

## 2016-07-06 NOTE — Assessment & Plan Note (Signed)
Called and informed repeat Hgb did not improve with iron.  Will refer to GI

## 2016-07-17 ENCOUNTER — Telehealth: Payer: Self-pay | Admitting: *Deleted

## 2016-07-17 NOTE — Telephone Encounter (Signed)
Contacted pt to inform her of her appointment with Dr. Collene Mares  via fax from Dr. Collene Mares office.  The appointment is 07/19/16@9 :45am.

## 2016-07-19 ENCOUNTER — Telehealth: Payer: Self-pay | Admitting: Internal Medicine

## 2016-07-19 NOTE — Telephone Encounter (Signed)
1. Type of surgery: EGD & colonoscopy (d/t iron deficiency anemia) 2. Date of surgery: not yet scheduled 3. Surgeon: Dr. Collene Mares 4. Medications that need to be held & how long: patient is on eliquis 5. Fax and/or Phone: (f) [phone]  (p) [phone] -- attn: [place]

## 2016-07-20 NOTE — Telephone Encounter (Signed)
Clearance routed via EPIC to Dr. Collene Mares

## 2016-07-20 NOTE — Telephone Encounter (Signed)
Ok to hold Eliquis for 2 days prior to EGD/Colonoscopy. Acceptable risk for procedure.  Dr. Lemmie Evens

## 2016-08-09 ENCOUNTER — Other Ambulatory Visit: Payer: Self-pay | Admitting: Neurology

## 2016-08-09 ENCOUNTER — Encounter: Payer: Self-pay | Admitting: Family Medicine

## 2016-08-09 ENCOUNTER — Other Ambulatory Visit: Payer: Self-pay | Admitting: Family Medicine

## 2016-08-09 ENCOUNTER — Ambulatory Visit (INDEPENDENT_AMBULATORY_CARE_PROVIDER_SITE_OTHER): Payer: Medicare Other | Admitting: Family Medicine

## 2016-08-09 DIAGNOSIS — D509 Iron deficiency anemia, unspecified: Secondary | ICD-10-CM

## 2016-08-09 DIAGNOSIS — M7051 Other bursitis of knee, right knee: Secondary | ICD-10-CM

## 2016-08-09 DIAGNOSIS — I4892 Unspecified atrial flutter: Secondary | ICD-10-CM | POA: Diagnosis not present

## 2016-08-09 DIAGNOSIS — Z1231 Encounter for screening mammogram for malignant neoplasm of breast: Secondary | ICD-10-CM

## 2016-08-09 DIAGNOSIS — I5189 Other ill-defined heart diseases: Secondary | ICD-10-CM

## 2016-08-09 DIAGNOSIS — Z23 Encounter for immunization: Secondary | ICD-10-CM

## 2016-08-09 DIAGNOSIS — I519 Heart disease, unspecified: Secondary | ICD-10-CM

## 2016-08-09 LAB — CBC
HEMATOCRIT: 36.6 % (ref 35.0–45.0)
HEMOGLOBIN: 11.5 g/dL — AB (ref 11.7–15.5)
MCH: 25.4 pg — ABNORMAL LOW (ref 27.0–33.0)
MCHC: 31.4 g/dL — ABNORMAL LOW (ref 32.0–36.0)
MCV: 81 fL (ref 80.0–100.0)
MPV: 11.1 fL (ref 7.5–12.5)
Platelets: 249 10*3/uL (ref 140–400)
RBC: 4.52 MIL/uL (ref 3.80–5.10)
RDW: 17.4 % — AB (ref 11.0–15.0)
WBC: 8.8 10*3/uL (ref 3.8–10.8)

## 2016-08-09 NOTE — Patient Instructions (Addendum)
The term to google is pre patellar bursitis or housemaid's knee.  You did the right thing with ice.  As long as you don't irritate it again, it should go away. I will call with the blood count results. You can also google diastolic dysfunction CHF congestive heart failure. You can also google ablation of aberant pathways for atrial fibrillation.

## 2016-08-10 DIAGNOSIS — M7051 Other bursitis of knee, right knee: Secondary | ICD-10-CM | POA: Insufficient documentation

## 2016-08-10 HISTORY — DX: Other bursitis of knee, right knee: M70.51

## 2016-08-10 NOTE — Assessment & Plan Note (Signed)
RSR now.  On anticoag.  Cards following.  Educated.

## 2016-08-10 NOTE — Assessment & Plan Note (Signed)
Recheck CBC. 

## 2016-08-10 NOTE — Assessment & Plan Note (Signed)
Pre patellar or housemaid's knee. Discussed staying off knees.

## 2016-08-10 NOTE — Assessment & Plan Note (Signed)
>>  ASSESSMENT AND PLAN FOR PAROXYSMAL ATRIAL FLUTTER ([facility]) WRITTEN ON 08/10/2016 11:59 AM BY HENSEL, Santiago Bumpers, MD  RSR now.  On anticoag.  Cards following.  Educated.

## 2016-08-10 NOTE — Assessment & Plan Note (Signed)
Educated.  No intervention needed.

## 2016-08-10 NOTE — Progress Notes (Signed)
   Subjective:    Patient ID: Christina Hardin, female    DOB: 12/04/1950, 65 y.o.   MRN: [id number]  HPI Several issues. 1. Has slow GI blood loss on eliquis.  Needs blood count.  Has been seen by GI.  Given eliquis and knowing she is UTD on colon cancer screen, they prefer no work up. 2. Worked on knees and develped pain and not on right knee.  She iced and the swelling is nicely receding.  No single trauma or twisting to the knee.   3. She had questions about the diastolic dysfunction.  Worried about Dx of CHF.  I do not think she currently qualifies as CHF since asymptomatic.  Educated. 4. Also followed by cards for PAF.  Only occasional palpitations.  Cards has mentioned ablation.    Review of Systems     Objective:   Physical ExamLungs clear Cardiac RRR without m or g Rt knee - prepatellar bursitis.  No redness or break in skin to suggest infection. No knee effusion.  Ligaments intact to provocative testing.        Assessment & Plan:

## 2016-08-27 ENCOUNTER — Ambulatory Visit
Admission: RE | Admit: 2016-08-27 | Discharge: 2016-08-27 | Disposition: A | Discharge status: Home / Self Care | Payer: Medicare Other | Source: Ambulatory Visit | Attending: Family Medicine | Admitting: Family Medicine

## 2016-08-27 DIAGNOSIS — Z1231 Encounter for screening mammogram for malignant neoplasm of breast: Secondary | ICD-10-CM

## 2016-08-30 ENCOUNTER — Other Ambulatory Visit: Payer: Self-pay | Admitting: Family Medicine

## 2016-08-30 DIAGNOSIS — R928 Other abnormal and inconclusive findings on diagnostic imaging of breast: Secondary | ICD-10-CM

## 2016-09-03 ENCOUNTER — Other Ambulatory Visit: Payer: Self-pay | Admitting: Family Medicine

## 2016-09-03 ENCOUNTER — Other Ambulatory Visit: Payer: Self-pay

## 2016-09-03 DIAGNOSIS — R928 Other abnormal and inconclusive findings on diagnostic imaging of breast: Secondary | ICD-10-CM

## 2016-09-04 ENCOUNTER — Ambulatory Visit
Admission: RE | Admit: 2016-09-04 | Discharge: 2016-09-04 | Disposition: A | Discharge status: Home / Self Care | Payer: Medicare Other | Source: Ambulatory Visit | Attending: Family Medicine | Admitting: Family Medicine

## 2016-09-04 ENCOUNTER — Other Ambulatory Visit: Payer: Self-pay | Admitting: Family Medicine

## 2016-09-04 DIAGNOSIS — N6320 Unspecified lump in the left breast, unspecified quadrant: Secondary | ICD-10-CM

## 2016-09-04 DIAGNOSIS — R928 Other abnormal and inconclusive findings on diagnostic imaging of breast: Secondary | ICD-10-CM

## 2016-09-05 ENCOUNTER — Ambulatory Visit
Admission: RE | Admit: 2016-09-05 | Discharge: 2016-09-05 | Disposition: A | Discharge status: Home / Self Care | Payer: Medicare Other | Source: Ambulatory Visit | Attending: Family Medicine | Admitting: Family Medicine

## 2016-09-05 DIAGNOSIS — N6320 Unspecified lump in the left breast, unspecified quadrant: Secondary | ICD-10-CM

## 2016-09-05 DIAGNOSIS — C50919 Malignant neoplasm of unspecified site of unspecified female breast: Secondary | ICD-10-CM

## 2016-09-05 HISTORY — DX: Malignant neoplasm of unspecified site of unspecified female breast: C50.919

## 2016-09-10 ENCOUNTER — Ambulatory Visit: Payer: Self-pay | Admitting: Surgery

## 2016-09-10 ENCOUNTER — Other Ambulatory Visit: Payer: Self-pay | Admitting: Surgery

## 2016-09-10 DIAGNOSIS — D0510 Intraductal carcinoma in situ of unspecified breast: Secondary | ICD-10-CM | POA: Insufficient documentation

## 2016-09-10 DIAGNOSIS — D0512 Intraductal carcinoma in situ of left breast: Secondary | ICD-10-CM

## 2016-09-10 HISTORY — DX: Intraductal carcinoma in situ of unspecified breast: D05.10

## 2016-09-10 NOTE — H&P (Signed)
History of Present Illness Christina Hardin. Christina Kandler MD; 09/10/2016 12:49 PM) Patient words: breast cancer.  The patient is a 65 year old female who presents with breast cancer. 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  This is a 65 year old female with no significant past history of breast problems who presents after recent routine mammogram. She has some suspicious findings in the upper outer quadrant of the left breast. On 09/04/16 she underwent tomo diagnostic mammogram on the left as well as ultrasound. Ultrasound was unable to identify this area. The axilla was negative for adenopathy. Tomo synthesis showed a 2.0 x 1.4 cm area of microcalcifications with some mild distortion. She underwent stereotactic biopsy on 10/4. This showed DCIS. Prognostic panel is pending. She is now referred for surgical evaluation. The patient denies any recent breast symptoms. She has had no breast pain or nipple drainage.  Menarche age 76 First pregnancy age 71 Breast-feeding yes Hormones less than 6 months after her hysterectomy The patient had a hysterectomy with bilateral salpingo-oophorectomy at age 73 Family history breast cancer in a maternal aunt  The patient was recently diagnosed with a thoracic aortic aneurysm that is being followed by Dr. Debara Hardin. She is anticoagulated for atrial fibrillation.   CLINICAL DATA: Screening.  EXAM: DIGITAL SCREENING BILATERAL MAMMOGRAM WITH CAD  COMPARISON: Previous exam(s).  ACR Breast Density Category b: There are scattered areas of fibroglandular density.  FINDINGS: In the left breast, a possible mass warrants further evaluation. In the right breast, no findings suspicious for malignancy.  Images were processed with CAD.  IMPRESSION: Further evaluation is suggested for possible mass in the left breast.  RECOMMENDATION: Diagnostic mammogram and possibly ultrasound of the left breast. (Code:[id number])  The patient will be contacted regarding the  findings, and additional imaging will be scheduled.  BI-RADS CATEGORY 0: Incomplete. Need additional imaging evaluation and/or prior mammograms for comparison.   Electronically Signed By: Christina Hardin M.D. On: 08/28/2016 13:47  CLINICAL DATA: Patient returns after screening study for evaluation of possible left breast mass.  EXAM: 2D DIGITAL DIAGNOSTIC LEFT MAMMOGRAM WITH CAD AND ADJUNCT TOMO  ULTRASOUND LEFT BREAST  COMPARISON: Prior studies including most recent 08/27/2016  ACR Breast Density Category b: There are scattered areas of fibroglandular density.  FINDINGS: Additional tomosynthesis images are performed which demonstrate persistent vague density in the upper-outer quadrant of the left breast measuring 2.0 x 1.4 cm, associated with microcalcifications. Density is associated with possible mild distortion.  Mammographic images were processed with CAD.  On physical exam, I palpate no abnormality in the upper-outer quadrant of the left breast.  Targeted ultrasound is performed, showing normal appearing fibroglandular tissue throughout the lateral aspect of the left breast. Evaluation of the axilla is negative for adenopathy.  IMPRESSION: Persistent mammographic density without associated ultrasound correlate. Image guided core biopsy is recommended.  No adenopathy identified by ultrasound.  RECOMMENDATION: Stereotactic guided core biopsy is recommended and scheduled for the patient on 09/05/2016.  I have discussed the findings and recommendations with the patient. Results were also provided in writing at the conclusion of the visit. If applicable, a reminder letter will be sent to the patient regarding the next appointment.  BI-RADS CATEGORY 4: Suspicious.   Electronically Signed By: Christina Hardin M.D. On: 09/04/2016 14:49  CLINICAL DATA: Asymmetry with calcifications within the left breast.  EXAM: LEFT BREAST STEREOTACTIC CORE  NEEDLE BIOPSY  COMPARISON: Previous exams.  FINDINGS: The patient and I discussed the procedure of stereotactic-guided biopsy including benefits and alternatives. We discussed  the high likelihood of a successful procedure. We discussed the risks of the procedure including infection, bleeding, tissue injury, clip migration, and inadequate sampling. Informed written consent was given. The usual time out protocol was performed immediately prior to the procedure.  Using sterile technique and 1% Lidocaine as local anesthetic, under stereotactic guidance, a 9 gauge vacuum assisted device was used to perform core needle biopsy of calcifications with asymmetry located within the left upper outer quadrant using a lateral approach. Specimen radiograph was performed showing representative calcifications within the specimen. Specimens with calcifications are identified for pathology.  At the conclusion of the procedure, a coil shaped tissue marker clip was deployed into the biopsy cavity. Follow-up 2-view mammogram was performed and dictated separately.  IMPRESSION: Stereotactic-guided biopsy of left breast calcifications with asymmetry as discussed above. No apparent complications.   Electronically Signed By: Christina Hardin M.D. On: 09/05/2016 16:37  Diagnosis Breast, left, needle core biopsy, UOQ - DUCTAL CARCINOMA IN SITU WITH CALCIFICATIONS. - SEE COMMENT. Microscopic Comment The carcinoma appears intermediate grade. Estrogen receptor and progesterone receptor studies will be performed and the results reported separately. The results were called to the Christina Hardin on 09/06/2016. (JBM:kh 09/06/16) Christina Cutter MD Pathologist, Electronic Signature (Case signed 09/06/2016)   Other Problems (Christina Eversole, Christina; 624THL [id number] Hardin) Anxiety Disorder Arthritis Atrial Fibrillation Back Pain Diverticulosis Gastroesophageal Reflux Disease High blood  pressure Kidney Stone Oophorectomy Bilateral. Other disease, cancer, significant illness  Past Surgical History (Christina Eversole, Christina; 624THL [id number] Hardin) Appendectomy Breast Biopsy Left. Colon Polyp Removal - Colonoscopy Gastric Bypass Hysterectomy (not due to cancer) - Complete Oral Surgery  Diagnostic Studies History (Christina Eversole, Christina; 624THL [id number] Hardin) Colonoscopy 1-5 years ago Mammogram within last year Pap Smear >5 years ago  Allergies (Christina Eversole, Christina; 624THL [id number] Hardin) Dilaudid *ANALGESICS - OPIOID* Propoxyphene-APAP *ANALGESICS - OPIOID*  Medication History (Christina Eversole, Christina; 624THL [id number] Hardin) Eliquis (5MG  Tablet, Oral) Active. Famotidine (40MG  Tablet, Oral) Active. Ferrex 150 (150MG  Capsule, Oral) Active. Metoprolol Succinate ER (25MG  Tablet ER 24HR, Oral) Active. HydroCHLOROthiazide (25MG  Tablet, Oral) Active. Vitamin B12 (100MCG Tablet, Oral) Active. Potassium (99MG  Tablet, Oral) Active. Multiple Vitamin (Oral) Active. Calcium (500MG  Tablet, Oral) Active. Tylenol (500MG  Capsule, Oral) Active. Medications Reconciled  Social History (Christina Eversole, Christina; 624THL QA348G Hardin) Caffeine use Carbonated beverages. No alcohol use No drug use Tobacco use Former smoker.  Family History Aleatha Borer, Christina; 624THL QA348G Hardin) Arthritis Mother, Sister. Cancer Father. Colon Polyps Mother. Heart Disease Mother. Heart disease in female family member before age 20 Hypertension Brother, Mother, Sister, Son. Melanoma Father. Migraine Headache Father. Respiratory Condition Father.  Pregnancy / Birth History Aleatha Borer, Christina; 624THL QA348G Hardin) Age at menarche 31 years. Gravida 6 Length (months) of breastfeeding 3-6 Maternal age 32-20 Para 3     Review of Systems (Christina Eversole Christina; 624THL QA348G Hardin) General Present- Fatigue. Not Present- Appetite Loss, Chills, Fever, Night Sweats, Weight Gain and  Weight Loss. Cardiovascular Present- Palpitations and Shortness of Breath. Not Present- Chest Pain, Difficulty Breathing Lying Down, Leg Cramps, Rapid Heart Rate and Swelling of Extremities. Musculoskeletal Present- Back Pain and Joint Pain. Not Present- Joint Stiffness, Muscle Pain, Muscle Weakness and Swelling of Extremities. Hematology Present- Blood Thinners. Not Present- Easy Bruising, Excessive bleeding, Gland problems, HIV and Persistent Infections.  Vitals (Christina Eversole Christina; 624THL [id number] Hardin) 09/10/2016 11:10 Hardin Weight: 191.4 lb Height: 64.5in Body Surface Area: 1.93 m Body Mass Index: 32.35 kg/m  Temp.: 98.25F(Oral)  Pulse: 74 (Regular)  BP: 148/82 (Sitting, Left Arm, Standard)      Physical Exam Rodman Key K. Lenford Beddow MD; 09/10/2016 12:49 PM)  The physical exam findings are as follows: Note:WDWN in NAD Eyes: Pupils equal, round; sclera anicteric HENT: Oral mucosa moist; good dentition Neck: No masses palpated, no thyromegaly Lungs: CTA bilaterally; normal respiratory effort Breasts: symmetric; no nipple retraction or discharge NO axillary lymphadenopathy No palpable masses in either breast CV: Regular rate and rhythm; no murmurs; extremities well-perfused with no edema Abd: +bowel sounds, soft, non-tender, no palpable organomegaly; no palpable hernias Skin: Warm, dry; no sign of jaundice Psychiatric - alert and oriented x 4; calm mood and affect    Assessment & Plan Rodman Key K. Kathaleen Dudziak MD; 09/10/2016 12:51 PM)  DUCTAL CARCINOMA IN SITU (DCIS) OF LEFT BREAST (D05.12)  Current Plans MRI, BOTH BREASTS [id number]) Instructed to make follow-up appointment for office visit following completion of diagnostic tests Note:I spent over 30 minutes of the 40 minute visit in discussion with the patient and her sister, explaining her disease, the options for surgical treatment, and the recommendation for MRI prior to scheduling surgery.  We will obtain cardiac clearance  from Dr. Debara Hardin as she will need to hold her anticoagulation.  If the MRI confirms a single site of unilateral disease, we will proceed with left radioactive seed localized lumpectomy with left axillary sentinel lymph node biopsy. Since the lump is in the upper outer quadrant, I'm concerned that we would be unable to perform a sentinel lymph node biopsy if indicated if the final pathology returns with invasive disease. I explained this to the patient and she understands the recommendation for sentinel lymph node biopsy at the time of the initial surgery. The surgical procedure has been discussed with the patient. Potential risks, benefits, alternative treatments, and expected outcomes have been explained. All of the patient's questions at this time have been answered. The likelihood of reaching the patient's treatment goal is good. The patient understand the proposed surgical procedure and wishes to proceed.  Christina Hardin. Georgette Dover, MD, [facility] Surgery  General/ Trauma Surgery  09/10/2016 12:52 PM

## 2016-09-11 ENCOUNTER — Telehealth: Payer: Self-pay | Admitting: *Deleted

## 2016-09-11 NOTE — Telephone Encounter (Signed)
Pt is needing clearance for breast surgery and they are also needing directions regarding the patients eliquis. Will forward for dr hilty's review and advise

## 2016-09-12 ENCOUNTER — Encounter: Payer: Self-pay | Admitting: Genetic Counselor

## 2016-09-13 ENCOUNTER — Telehealth: Payer: Self-pay | Admitting: Physician Assistant

## 2016-09-13 NOTE — Telephone Encounter (Signed)
Received records from [facility] for appointment on 09/19/16 with Almyra Deforest, PA.  Records given to [employer] (medical records) for Hao's schedule on 09/19/16. lp

## 2016-09-14 ENCOUNTER — Telehealth: Payer: Self-pay | Admitting: Genetic Counselor

## 2016-09-14 ENCOUNTER — Encounter: Payer: Self-pay | Admitting: Genetic Counselor

## 2016-09-14 ENCOUNTER — Ambulatory Visit
Admission: RE | Admit: 2016-09-14 | Discharge: 2016-09-14 | Disposition: A | Discharge status: Home / Self Care | Payer: Medicare Other | Source: Ambulatory Visit | Attending: Surgery | Admitting: Surgery

## 2016-09-14 DIAGNOSIS — D0512 Intraductal carcinoma in situ of left breast: Secondary | ICD-10-CM

## 2016-09-14 MED ORDER — GADOBENATE DIMEGLUMINE 529 MG/ML IV SOLN
18.0000 mL | Freq: Once | INTRAVENOUS | Status: AC | PRN
  Administered 2016-09-14: 18 mL via INTRAVENOUS

## 2016-09-14 NOTE — Telephone Encounter (Signed)
Pt returned my call to schedule a genetic appt. Appt scheduled for 11/20 @1pm . Pt aware to arrive 30 minutes early. Demographics verified. Letter mailed.

## 2016-09-14 NOTE — Telephone Encounter (Signed)
Patient is seeing Almyra Deforest on 10/18 for clearance - likely to recommend holding Eliquis 3 days prior to surgery.  Dr. Lemmie Evens

## 2016-09-17 ENCOUNTER — Ambulatory Visit: Payer: Self-pay | Admitting: Surgery

## 2016-09-17 NOTE — Progress Notes (Signed)
Please call the patient and let them know that the MRI showed no other sign of cancer in her breasts.  Once we have cardiac clearance, we can schedule her surgery

## 2016-09-19 ENCOUNTER — Ambulatory Visit: Payer: Medicare Other | Admitting: Physician Assistant

## 2016-09-21 ENCOUNTER — Ambulatory Visit (INDEPENDENT_AMBULATORY_CARE_PROVIDER_SITE_OTHER): Payer: Medicare Other | Admitting: Physician Assistant

## 2016-09-21 ENCOUNTER — Other Ambulatory Visit: Payer: Medicare Other

## 2016-09-21 ENCOUNTER — Encounter: Payer: Self-pay | Admitting: Physician Assistant

## 2016-09-21 VITALS — BP 135/90 | HR 67 | Ht 64.5 in | Wt 192.4 lb

## 2016-09-21 DIAGNOSIS — Z01818 Encounter for other preprocedural examination: Secondary | ICD-10-CM | POA: Diagnosis not present

## 2016-09-21 DIAGNOSIS — I712 Thoracic aortic aneurysm, without rupture, unspecified: Secondary | ICD-10-CM

## 2016-09-21 DIAGNOSIS — I4892 Unspecified atrial flutter: Secondary | ICD-10-CM

## 2016-09-21 DIAGNOSIS — I10 Essential (primary) hypertension: Secondary | ICD-10-CM

## 2016-09-21 DIAGNOSIS — R0602 Shortness of breath: Secondary | ICD-10-CM

## 2016-09-21 NOTE — Patient Instructions (Addendum)
Medication Instructions:  Continue current medications  Labwork: None Ordered  Testing/Procedures: Your physician has requested that you have a lexiscan myoview. For further information please visit [email or web address]. Please follow instruction sheet, as given.    Follow-Up: Your physician recommends that you schedule a follow-up appointment in: 2 Months with Dr Debara Pickett.  You have been referred to Cardiothoracic Surgery    Any Other Special Instructions Will Be Listed Below (If Applicable).   If you need a refill on your cardiac medications before your next appointment, please call your pharmacy.

## 2016-09-21 NOTE — Progress Notes (Signed)
Cardiology Office Note    Date:  09/21/2016   ID:  Christina Hardin, DOB 26-Aug-1951, MRN [id number]  PCP:  Zigmund Gottron, MD  Cardiologist:  Dr. Debara Pickett   Chief Complaint  Patient presents with  . Pre-op Exam    seen for Dr. Debara Pickett, refered by Dr. Molli Posey with [facility], pending L breast surgery for dutal carcinoma in situ    History of Present Illness:  Christina Hardin is a 65 y.o. female with PMH of paroxysmal atrial flutter, hypertension, rheumatoid arthritis, thoracic aortic aneurysm. She did have a remote cardiac catheterization on 04/18/2011 after a positive stress test, it came back showing no evidence of coronary artery disease. The stress test was felt to be falsely positive. She had a cardiac event monitor placed on 06/12/2016 which showed atrial flutter with PSVT. She had echocardiogram obtained on 06/22/2016 showed EF 0000000, grade 2 diastolic dysfunction, no regional wall motion abnormality. Prior to all this, she actually had a syncopal episode on a flight. It was felt that she likely had paroxysmal atrial flutter with loss of atrial kick, combining with her underlying grade 2 diastolic dysfunction causing her to have significant drop into cardiac output. This is worsened by the fact that she was previously on diuretic, fortunately this has been stopped. She was started on metoprolol with some improvement improvement in her symptom. She also noted episodes of palpitation however it is less frequent and less severe. As no further recent BP. She did have slight anemia despite started on iron, it was felt maybe related to the initiation of eliquis. Note she also has a ascending thoracic aneurysm measuring up to 4.5 cm. She was referred to cardiothoracic surgery. There is also a 3 mm nodule in the right middle lobe no follow-up needed if patient is low risk, otherwise consider noncontrast chest CT in 12 months.  Received her record from Dr. Donnie Mesa with [facility],  patient has ductal carcinoma in situ of the left breast. She initially had some suspicious finding in the upper outer quadrant of the left breast on routine mammogram. U/S was negative for adenopathy, tomo diagnostic mammography however showed 2.0 x 1.4 cm area of microcalcification with some mild distortion. She underwent stereotactic biopsy on 09/05/2016 which showed DCIS. She was referred to Dr. Molli Posey for surgical evaluation. She presents today for cardiology preop clearance. Record from Dr. Deliah Boston office has been reviewed. She says she has occasional sharp chest discomfort that only last a few seconds at this time, however does not seems to occur with exertion. And that she has not really exerted herself recently either. Ever since her syncopal episode on the air flight, she has been noticing some shortness of breath both at rest and with exertion. This shortness of breath is unchanged after she converted back to sinus rhythm. Also despite radiology recommendation in June for CT surgery referral, she has not been evaluated by CT surgery regarding her 4.5 cm thoracic aortic aneurysm.    Past Medical History:  Diagnosis Date  . A-fib ([place])   . Ductal carcinoma in situ (DCIS) of left breast   . Gastric bypass status for obesity   . Hypertension   . Rheumatoid arthritis ([place])   . Thoracic aortic aneurysm ([place])   . Thoracic aortic aneurysm [facility])     Past Surgical History:  Procedure Laterality Date  . BREAST BIOPSY Left   . GASTRIC BYPASS    . HYSTERECTOMY ABDOMINAL WITH SALPINGECTOMY  Current Medications: Outpatient Medications Prior to Visit  Medication Sig Dispense Refill  . apixaban (ELIQUIS) 5 MG TABS tablet Take 1 tablet (5 mg total) by mouth 2 (two) times daily. 60 tablet 5  . Calcium-Magnesium-Vitamin D (CALCIUM 500 PO) Take by mouth daily.    . Cyanocobalamin (VITAMIN B-12) 2500 MCG SUBL Take 1 tablet by mouth daily. Reported on 06/08/2016    . famotidine (PEPCID) 40 MG tablet Take  1 tablet (40 mg total) by mouth daily. 90 tablet 3  . hydrochlorothiazide (HYDRODIURIL) 25 MG tablet Take 25 mg by mouth daily.    . iron polysaccharides (NIFEREX) 150 MG capsule Take 1 capsule (150 mg total) by mouth daily. 90 capsule 1  . metoprolol succinate (TOPROL-XL) 25 MG 24 hr tablet Take 1 tablet (25 mg total) by mouth daily. 90 tablet 3  . metroNIDAZOLE (METROCREAM) 0.75 % cream Apply 1 application topically 2 (two) times daily as needed (rosacea).    . Multiple Vitamin (MULTIVITAMIN) capsule Take 1 capsule by mouth daily. Reported on 06/08/2016    . Potassium 99 MG TABS Take 99 mg by mouth daily.     No facility-administered medications prior to visit.      Allergies:   Dilaudid [hydromorphone hcl] and Propoxyphene hcl   Social History   Social History  . Marital status: Married    Spouse name: N/A  . Number of children: N/A  . Years of education: N/A   Social History Main Topics  . Smoking status: Never Smoker  . Smokeless tobacco: Never Used  . Alcohol use None  . Drug use: Unknown  . Sexual activity: Not Asked   Other Topics Concern  . None   Social History Narrative  . None     Family History:  The patient's family history includes Arthritis in her mother; Atrial fibrillation in her mother; COPD in her father; Dementia in her mother; Emphysema in her father; Gout in her mother; Healthy in her brother and brother; Heart attack in her maternal aunt, maternal grandfather, maternal uncle, and paternal uncle; Hypertension in her mother and sister; Lung cancer in her father; Melanoma in her father; Other in her mother; Sleep apnea in her sister; Stroke in her mother.   ROS:   Please see the history of present illness.    ROS All other systems reviewed and are negative.   PHYSICAL EXAM:   VS:  BP 135/90   Pulse 67   Ht 5' 4.5" (1.638 m)   Wt 192 lb 6.4 oz (87.3 kg)   BMI 32.52 kg/m    GEN: Well nourished, well developed, in no acute distress  HEENT: normal    Neck: no JVD, carotid bruits, or masses Cardiac: RRR; no murmurs, rubs, or gallops,no edema  Respiratory:  clear to auscultation bilaterally, normal work of breathing GI: soft, nontender, nondistended, + BS MS: no deformity or atrophy  Skin: warm and dry, no rash Neuro:  Alert and Oriented x 3, Strength and sensation are intact Psych: euthymic mood, full affect  Wt Readings from Last 3 Encounters:  09/21/16 192 lb 6.4 oz (87.3 kg)  08/09/16 190 lb 6.4 oz (86.4 kg)  07/06/16 193 lb 8 oz (87.8 kg)      Studies/Labs Reviewed:   EKG:  EKG is ordered today.  The ekg ordered today demonstrates Normal sinus rhythm, PVCs, heart rate 63, otherwise no significant ST-T wave changes  Recent Labs: 05/24/2016: ALT 7; BUN 34; Creatinine, Ser 0.90; Potassium 3.5; Sodium 141; TSH  0.81 08/09/2016: Hemoglobin 11.5; Platelets 249   Lipid Panel    Component Value Date/Time   CHOL 149 12/10/2014 1117   TRIG 109 12/10/2014 1117   HDL 51 12/10/2014 1117   CHOLHDL 2.9 12/10/2014 1117   VLDL 22 12/10/2014 1117   LDLCALC 76 12/10/2014 1117    Additional studies/ records that were reviewed today include:   Cath 06/04/2000 PROCEDURES: 1. Left heart catheterization. 2. Selective right and left coronary arteriography. 3. Ventriculography in the right anterior oblique projection.  RESULTS: 1. Hemodynamic monitoring:  Central aortic pressure 148/88, left    ventricular pressure 142/21 with no aortic valve gradient noted at the    time of pullback. 2. Ventriculography:  Ventriculography in the RAO projection revealed    normal left ventricular systolic function.  The ejection fraction    was greater than 55%.  PVCs were noted during the ventriculogram.    The end-diastolic pressure was 19.  CORONARY ARTERIOGRAPHY:  There was no calcification noted on fluoroscopy. 1. Left main:  Normal. 2. LAD.  The LAD extended down to the apex of the heart and gave rise to    one moderate sized first diagonal  branch.  This entire system was free    of disease. 3. Right coronary artery:  The right coronary artery was a small    diameter vessel giving rise only to the PDA free of disease. 4. Circumflex:  The circumflex was a large dominant vessel giving rise to    three OMs.  This entire system was free of disease.  CONCLUSIONS: 1. No evidence of coronary artery disease. 2. Normal left ventricular systolic function.  DISCUSSION:  There is nothing to suggest that the cause for her shortness of breath or shoulder, neck and arm pain is cardiac in origin.  I suspect that the nuclear study was a false-positive study.    CTA of chest 05/24/2016 IMPRESSION: Negative for pulmonary embolism.  No acute abnormality in the chest.  Aneurysm of the ascending thoracic aorta measuring up to 4.5 cm. Recommend semi-annual imaging followup by CTA or MRA and referral to cardiothoracic surgery if not already obtained. This recommendation follows 2010 ACCF/AHA/AATS/ACR/ASA/SCA/SCAI/SIR/STS/SVM Guidelines for the Diagnosis and Management of Patients With Thoracic Aortic Disease. Circulation. 2010; 121: e266-e369  3 mm nodule in the right middle lobe. No follow-up needed if patient is low-risk (and has no known or suspected primary neoplasm). Non-contrast chest CT can be considered in 12 months if patient is high-risk. This recommendation follows the consensus statement: Guidelines for Management of Incidental Pulmonary Nodules Detected on CT Images:From the Fleischner Society 2017; published online before print (10.1148/radiol.SG:5268862).    Cardiac Monitor 06/12/2016 Study Highlights   Monitor shows atrial flutter and PSVT- patient has already been started on appropriate therapy.    Echo 06/22/2016 LV EF: 55% -   60%  ------------------------------------------------------------------- Study Conclusions  - Left ventricle: The cavity size was normal. Wall thickness was   normal. Systolic  function was normal. The estimated ejection   fraction was in the range of 55% to 60%. Wall motion was normal;   there were no regional wall motion abnormalities. Features are   consistent with a pseudonormal left ventricular filling pattern,   with concomitant abnormal relaxation and increased filling   pressure (grade 2 diastolic dysfunction).  ASSESSMENT:    1. Pre-operative clearance   2. Thoracic aortic aneurysm without rupture (HCC)   3. Paroxysmal atrial flutter ([place])   4. Shortness of breath   5. Essential  hypertension      PLAN:  In order of problems listed above:  1. Preop clearance for L breast surgery secondary to ductal carcinoma in situ - Did have clean coronary back in 2001, however recently had some atypical chest discomfort which is less concerning but also some shortness of breath that is not going away even after conversion to sinus rhythm. - Discussed with the patient, we'll pursue outpatient stress test prior to clearance. If negative, she will need to hold eliquis for 2 days prior to surgery.  2. SOB: Unclear cause for her shortness of breath, she says this started after her syncopal episode on the airplane. Has not resolved since. She does have some occasional sharp chest pain but only last a few seconds at a time.  3. Paroxysmal atrial flutter: On eliquis and Toprol-XL 25 mg daily. - This patients CHA2DS2-VASc Score and unadjusted Ischemic Stroke Rate (% per year) is equal to 3.2 % stroke rate/year from a score of 3  Above score calculated as 1 point each if present [CHF, HTN, DM, Vascular=MI/PAD/Aortic Plaque, Age if 65-74, or Female] Above score calculated as 2 points each if present [Age > 75, or Stroke/TIA/TE] - Heart rate well-controlled, currently in sinus rhythm based on EKG. No further adjustment of medication necessary prior to surgery.  4. Thoracic aortic aneurysm: For some reason, despite radiologist to recommendation of cardiothoracic surgery  referral back in June, this has not been done. She has 4.5 cm thoracic aortic aneurysm seen on previous CT angiogram of the chest. We'll refer her to CT surgery, however this can be done after her breast surgery.  5. Hypertension: Blood pressure fairly controlled on current medication.    Medication Adjustments/Labs and Tests Ordered: Current medicines are reviewed at length with the patient today.  Concerns regarding medicines are outlined above.  Medication changes, Labs and Tests ordered today are listed in the Patient Instructions below. Patient Instructions  Medication Instructions:  Continue current medications  Labwork: None Ordered  Testing/Procedures: Your physician has requested that you have a lexiscan myoview. For further information please visit [email or web address]. Please follow instruction sheet, as given.    Follow-Up: Your physician recommends that you schedule a follow-up appointment in: 2 Months with Dr Debara Pickett.  You have been referred to Cardiothoracic Surgery    Any Other Special Instructions Will Be Listed Below (If Applicable).   If you need a refill on your cardiac medications before your next appointment, please call your pharmacy.      Hilbert Corrigan, [place]  09/21/2016 10:31 PM    [facility] [address], [place], [place]  [address] Phone: [phone]; Fax: [phone]

## 2016-09-25 ENCOUNTER — Ambulatory Visit (HOSPITAL_COMMUNITY)
Admission: RE | Admit: 2016-09-25 | Discharge: 2016-09-25 | Disposition: A | Discharge status: Home / Self Care | Payer: Medicare Other | Source: Ambulatory Visit | Attending: Cardiovascular Disease | Admitting: Cardiovascular Disease

## 2016-09-25 DIAGNOSIS — Z01818 Encounter for other preprocedural examination: Secondary | ICD-10-CM | POA: Diagnosis present

## 2016-09-25 DIAGNOSIS — I2510 Atherosclerotic heart disease of native coronary artery without angina pectoris: Secondary | ICD-10-CM

## 2016-09-25 LAB — MYOCARDIAL PERFUSION IMAGING
CHL CUP RESTING HR STRESS: 59 {beats}/min
LV dias vol: 77 mL (ref 46–106)
LV sys vol: 30 mL
Peak HR: 92 {beats}/min
SDS: 4
SRS: 0
SSS: 4
TID: 1

## 2016-09-25 MED ORDER — TECHNETIUM TC 99M TETROFOSMIN IV KIT
32.0000 | PACK | Freq: Once | INTRAVENOUS | Status: AC | PRN
  Administered 2016-09-25: 32 via INTRAVENOUS
  Filled 2016-09-25: qty 32

## 2016-09-25 MED ORDER — AMINOPHYLLINE 25 MG/ML IV SOLN
75.0000 mg | Freq: Once | INTRAVENOUS | Status: AC
Start: 2016-09-25 — End: 2016-09-25
  Administered 2016-09-25: 75 mg via INTRAVENOUS

## 2016-09-25 MED ORDER — TECHNETIUM TC 99M TETROFOSMIN IV KIT
9.9000 | PACK | Freq: Once | INTRAVENOUS | Status: AC | PRN
  Administered 2016-09-25: 9.9 via INTRAVENOUS
  Filled 2016-09-25: qty 10

## 2016-09-25 MED ORDER — REGADENOSON 0.4 MG/5ML IV SOLN
0.4000 mg | Freq: Once | INTRAVENOUS | Status: AC
  Administered 2016-09-25: 0.4 mg via INTRAVENOUS

## 2016-09-26 ENCOUNTER — Telehealth: Payer: Self-pay | Admitting: Physician Assistant

## 2016-09-26 ENCOUNTER — Encounter: Payer: Self-pay | Admitting: Physician Assistant

## 2016-09-26 NOTE — Telephone Encounter (Signed)
it is up to the surgeon, we usually tell the surgeon to restart as soon as they deem the bleeding risk is tolerable.

## 2016-09-26 NOTE — Telephone Encounter (Signed)
Results given to pt. Instructed to hold eliquis 2 days prior to surgery. She asked if she should restart it after surgery. I told her she should start it back regularly after surgery. Is that ok?

## 2016-09-26 NOTE — Telephone Encounter (Signed)
-----   Message from [place], [place] sent at 09/26/2016  5:54 AM EDT ----- Overall low risk study, she is cleared from cardiac perspective for surgery. Please hold eliquis for 2 days prior to surgery.

## 2016-09-28 ENCOUNTER — Telehealth: Payer: Self-pay | Admitting: Physician Assistant

## 2016-09-28 NOTE — Telephone Encounter (Signed)
Contacted pt to let her know it is up to the surgeon when to start her eliquis after surgery depending on the tolerance of bleeding risk. Pt verbalized understanding and asked when they would be calling to schedule her surgery. Contacted Dr. Vonna Kotyk office, they received the letter and will be calling pt. Informed pt of this.

## 2016-09-28 NOTE — Telephone Encounter (Signed)
-----   Message from [place], [place] sent at 09/26/2016  5:54 AM EDT ----- Overall low risk study, she is cleared from cardiac perspective for surgery. Please hold eliquis for 2 days prior to surgery.

## 2016-10-01 ENCOUNTER — Encounter: Payer: Self-pay | Admitting: Hematology

## 2016-10-01 ENCOUNTER — Other Ambulatory Visit: Payer: Self-pay | Admitting: Surgery

## 2016-10-01 ENCOUNTER — Telehealth: Payer: Self-pay | Admitting: Hematology

## 2016-10-01 DIAGNOSIS — D0512 Intraductal carcinoma in situ of left breast: Secondary | ICD-10-CM

## 2016-10-01 NOTE — Telephone Encounter (Signed)
Appt scheduled w/Feng on 11/17@11am . Pt aware to arrive 15-30 minutes early. Demographics verified. Letter mailed to the pt.

## 2016-10-02 ENCOUNTER — Encounter: Payer: Self-pay | Admitting: Radiation Oncology

## 2016-10-02 ENCOUNTER — Encounter (HOSPITAL_BASED_OUTPATIENT_CLINIC_OR_DEPARTMENT_OTHER): Payer: Self-pay | Admitting: *Deleted

## 2016-10-02 ENCOUNTER — Encounter (HOSPITAL_BASED_OUTPATIENT_CLINIC_OR_DEPARTMENT_OTHER)
Admission: RE | Admit: 2016-10-02 | Discharge: 2016-10-02 | Disposition: A | Discharge status: Home / Self Care | Payer: Medicare Other | Source: Ambulatory Visit | Attending: Surgery | Admitting: Surgery

## 2016-10-02 DIAGNOSIS — Z01812 Encounter for preprocedural laboratory examination: Secondary | ICD-10-CM | POA: Diagnosis present

## 2016-10-02 LAB — BASIC METABOLIC PANEL
ANION GAP: 9 (ref 5–15)
BUN: 28 mg/dL — ABNORMAL HIGH (ref 6–20)
CALCIUM: 10.4 mg/dL — AB (ref 8.9–10.3)
CHLORIDE: 104 mmol/L (ref 101–111)
CO2: 27 mmol/L (ref 22–32)
Creatinine, Ser: 0.84 mg/dL (ref 0.44–1.00)
GFR calc Af Amer: >60 mL/min (ref >60)
GFR calc non Af Amer: >60 mL/min (ref >60)
Glucose, Bld: 83 mg/dL (ref 65–99)
Potassium: 4 mmol/L (ref 3.5–5.1)
Sodium: 140 mmol/L (ref 135–145)

## 2016-10-02 NOTE — Progress Notes (Signed)
Location of Breast Cancer: Left breast Upper Outer Quadrant  Histology per Pathology Report: Diagnosis 09/05/2016: Breast, left, needle core biopsy, UOQ - DUCTAL CARCINOMA IN SITU WITH CALCIFICATIONS  Receptor Status: ER(100%+), PR (95%+), Her2-neu ( ), Ki-()  Did patient present with symptoms (if so, please note symptoms) or was this found on screening mammography?: Routine screening  Past/Anticipated interventions by surgeon, if any:Lumpectomy scheduled 10/09/2016 Dr. Tsuei,M.MD  Past/Anticipated interventions by medical oncology, if any: Chemotherapy : Dr. Feng 10/19/16  Lymphedema issues, if any:  NO Pain issues, if any:, left arm from fall 2 days ago at home  SAFETY ISSUES: no  Prior radiation?  NO  Pacemaker/ICD? NO  Possible current pregnancy? NO  Is the patient on methotrexate? NO  Current Complaints / other details:  Menarche age 11,G6 P3,   1st pregnancy age 18, Anxiety,Hysterectomy  With /b/l oophorectomy not due to cancer,A-fib,Htn, thoracic aortic aneurysm Gastric by-pass,2009    Allergies:Dilaudid=shortness breasth/propoxyphene-apap McElroy, Janice Bruner, RN 10/02/2016,10:17 AM BP (!) 155/98 (BP Location: Right Arm, Patient Position: Sitting, Cuff Size: Normal)   Pulse 68   Temp 98 F (36.7 C) (Oral)   Resp 16   Ht 5' 5" (1.651 m)   Wt 193 lb 3.2 oz (87.6 kg)   BMI 32.15 kg/m   Wt Readings from Last 3 Encounters:  10/08/16 193 lb 3.2 oz (87.6 kg)  09/25/16 192 lb (87.1 kg)  09/21/16 192 lb 6.4 oz (87.3 kg)    

## 2016-10-02 NOTE — Progress Notes (Signed)
Boost drink given with instructions to complete by 0900am day of surgery. Pt verbalized understanding.

## 2016-10-03 ENCOUNTER — Encounter: Payer: Medicare Other | Admitting: Surgery

## 2016-10-03 HISTORY — PX: BREAST LUMPECTOMY: SHX2

## 2016-10-05 ENCOUNTER — Ambulatory Visit
Admission: RE | Admit: 2016-10-05 | Discharge: 2016-10-05 | Disposition: A | Discharge status: Home / Self Care | Payer: Medicare Other | Source: Ambulatory Visit | Attending: Surgery | Admitting: Surgery

## 2016-10-05 DIAGNOSIS — D0512 Intraductal carcinoma in situ of left breast: Secondary | ICD-10-CM

## 2016-10-08 ENCOUNTER — Ambulatory Visit
Admission: RE | Admit: 2016-10-08 | Discharge: 2016-10-08 | Disposition: A | Discharge status: Home / Self Care | Payer: Medicare Other | Source: Ambulatory Visit | Attending: Radiation Oncology | Admitting: Radiation Oncology

## 2016-10-08 ENCOUNTER — Encounter: Payer: Self-pay | Admitting: Radiation Oncology

## 2016-10-08 ENCOUNTER — Telehealth: Payer: Self-pay | Admitting: *Deleted

## 2016-10-08 VITALS — BP 155/98 | HR 68 | Temp 98.0°F | Resp 16 | Ht 65.0 in | Wt 193.2 lb

## 2016-10-08 DIAGNOSIS — Z79899 Other long term (current) drug therapy: Secondary | ICD-10-CM | POA: Diagnosis not present

## 2016-10-08 DIAGNOSIS — C50512 Malignant neoplasm of lower-outer quadrant of left female breast: Secondary | ICD-10-CM

## 2016-10-08 DIAGNOSIS — Z170 Estrogen receptor positive status [ER+]: Secondary | ICD-10-CM | POA: Insufficient documentation

## 2016-10-08 DIAGNOSIS — Z9889 Other specified postprocedural states: Secondary | ICD-10-CM | POA: Insufficient documentation

## 2016-10-08 DIAGNOSIS — Z510 Encounter for antineoplastic radiation therapy: Secondary | ICD-10-CM | POA: Diagnosis not present

## 2016-10-08 DIAGNOSIS — C50412 Malignant neoplasm of upper-outer quadrant of left female breast: Secondary | ICD-10-CM | POA: Insufficient documentation

## 2016-10-08 DIAGNOSIS — D0512 Intraductal carcinoma in situ of left breast: Secondary | ICD-10-CM

## 2016-10-08 HISTORY — DX: Anxiety disorder, unspecified: F41.9

## 2016-10-08 HISTORY — DX: Malignant neoplasm of unspecified site of unspecified female breast: C50.919

## 2016-10-08 NOTE — Progress Notes (Signed)
Radiation Oncology         [phone]) [phone] ________________________________  Name: Christina Hardin MRN: [id number]  Date: 10/08/2016  DOB: [birth year]  HK:GOVPCH,EKBTCYE Arnell Sieving, MD  Donnie Mesa, MD     REFERRING PHYSICIAN: Donnie Mesa, MD   DIAGNOSIS: The primary encounter diagnosis was Ductal carcinoma in situ (DCIS) of left breast. A diagnosis of Malignant neoplasm of lower-outer quadrant of left breast of female, estrogen receptor positive ([place]) was also pertinent to this visit.  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 female seen at the request of Dr. Georgette Dover for a new diagnosis of left breast cancer. The patient was found to have a possible mass in the left breast on her screening mammogram on 08/27/16. She underwent diagnostic imaging of the left breast on 09/04/16. This revealed a vague density in the UOQ of the left breast measuring 2.0 x 1.4 cm with associated microcalcifications. On physical exam, no abnormalities were palpated in the UOQ of the left breast. Ultrasound showed normal appearing fibroglandular tissue throughout the lateral aspect of the left breast and the left axilla was negative for adenopathy.  Biopsy of the UOQ of the left breast on 09/05/16 revealed intermediate grade DCIS with calcifications (ER 100% +, PR 95% +). On 09/17/16, the patient had an MRI of the bilateral breasts revealing a 2.6 x 4.2 x 1.4 cm area of enhancement with washout kinetics at the left breast 2:00 posterior depth with associated clip artifact consistent with recently biopsied cancer. There was enhancement along the biopsy tract likely related to post biopsy enhancement.  She is scheduled to undergo left lumpectomy on 10/09/16 by Dr. Georgette Dover and comes to discuss the role of radiotherapy with Dr. Lisbeth Renshaw with her son. She is also scheduled to see Dr. Burr Medico on 10/19/16.  PREVIOUS RADIATION THERAPY: No   PAST  MEDICAL HISTORY:  Past Medical History:  Diagnosis Date  . A-fib ([place])   . Anemia   . Anxiety   . Breast cancer ([place]) 09/05/2016   left breast dcis  . Chronic kidney disease    hx kidney stone  . Ductal carcinoma in situ (DCIS) of left breast   . Gastric bypass status for obesity   . GERD (gastroesophageal reflux disease)   . Hypertension   . Rheumatoid arthritis ([place])   . Thoracic aortic aneurysm ([place])   . Thoracic aortic aneurysm ([place])        PAST SURGICAL HISTORY: Past Surgical History:  Procedure Laterality Date  . ABDOMINAL HYSTERECTOMY    . BREAST BIOPSY Left   . COLONOSCOPY W/ POLYPECTOMY    . GASTRIC BYPASS    . HYSTERECTOMY ABDOMINAL WITH SALPINGECTOMY       FAMILY HISTORY:  Family History  Problem Relation Age of Onset  . Atrial fibrillation Mother   . Other Mother     spinal stenosis  . Dementia Mother   . Hypertension Mother   . Gout Mother   . Arthritis Mother   . Stroke Mother   . COPD Father   . Emphysema Father   . Lung cancer Father   . Melanoma Father   . Hypertension Sister   . Sleep apnea Sister   . Healthy Brother   . Healthy Brother   . Heart attack Maternal Aunt   . Heart attack Maternal Uncle   . Heart attack Paternal Uncle   . Heart attack Maternal Grandfather  SOCIAL HISTORY:  reports that she has never smoked. She has never used smokeless tobacco. She reports that she does not drink alcohol or use drugs. The patient is widowed and resides in [place]. She is accompanied by her son.   ALLERGIES: Dilaudid [hydromorphone hcl] and Propoxyphene hcl   MEDICATIONS:  Current Outpatient Prescriptions  Medication Sig Dispense Refill  . Acetaminophen (TYLENOL ARTHRITIS PAIN PO) Take by mouth.    . Calcium-Magnesium-Vitamin D (CALCIUM 500 PO) Take by mouth daily.    . Cyanocobalamin (VITAMIN B-12) 2500 MCG SUBL Take 1 tablet by mouth daily. Reported on 06/08/2016    . famotidine (PEPCID) 40 MG tablet Take 1 tablet (40 mg total)  by mouth daily. 90 tablet 3  . hydrochlorothiazide (HYDRODIURIL) 25 MG tablet Take 25 mg by mouth daily.    . metoprolol succinate (TOPROL-XL) 25 MG 24 hr tablet Take 1 tablet (25 mg total) by mouth daily. 90 tablet 3  . apixaban (ELIQUIS) 5 MG TABS tablet Take 1 tablet (5 mg total) by mouth 2 (two) times daily. (Patient not taking: Reported on 10/08/2016) 60 tablet 5  . iron polysaccharides (NIFEREX) 150 MG capsule Take 1 capsule (150 mg total) by mouth daily. (Patient not taking: Reported on 10/08/2016) 90 capsule 1  . metroNIDAZOLE (METROCREAM) 0.75 % cream Apply 1 application topically 2 (two) times daily as needed (rosacea).    . Multiple Vitamin (MULTIVITAMIN) capsule Take 1 capsule by mouth daily. Reported on 06/08/2016    . Potassium 99 MG TABS Take 99 mg by mouth daily.     No current facility-administered medications for this encounter.      REVIEW OF SYSTEMS: On review of systems, the patient reports that she is doing well overall. She denies any chest pain, shortness of breath, cough, fevers, chills, night sweats, unintended weight changes. She denies any bowel or bladder disturbances, and denies abdominal pain, nausea or vomiting. He only complaint is a pain of her left arm from a fall 2 days ago at home. A complete review of systems is obtained and is otherwise negative.   PHYSICAL EXAM:  Wt Readings from Last 3 Encounters:  10/08/16 193 lb 3.2 oz (87.6 kg)  09/25/16 192 lb (87.1 kg)  09/21/16 192 lb 6.4 oz (87.3 kg)   Temp Readings from Last 3 Encounters:  10/08/16 98 F (36.7 C) (Oral)  08/09/16 98 F (36.7 C) (Oral)  06/21/16 97.8 F (36.6 C) (Oral)   BP Readings from Last 3 Encounters:  10/08/16 (!) 155/98  09/21/16 135/90  08/09/16 (!) 142/94   Pulse Readings from Last 3 Encounters:  10/08/16 68  09/21/16 67  08/09/16 (!) 58     Pain scale 0/10 In general this is a well appearing Caucasian female in no acute distress. She is alert and oriented x4 and  appropriate throughout the examination. HEENT reveals that the patient is normocephalic, atraumatic. EOMs are intact. PERRLA. Skin is intact without any evidence of gross lesions. Cardiovascular exam reveals a regular rate and rhythm, no clicks rubs or murmurs are auscultated. Chest is clear to auscultation bilaterally. Lymphatic assessment is performed and does not reveal any adenopathy in the cervical, supraclavicular, axillary, or inguinal chains. Bilateral breasts were examined and the right breast does not have any palpable abnormalities. The left breast has post biopsy change, and localization has been mapped out and has been marked with a surgical pen and covered with Tegaderm. No nipple bleeding or discharge noted with either breast. Abdomen has active bowel  sounds in all quadrants and is intact. The abdomen is soft, non tender, non distended. Lower extremities are negative for pretibial pitting edema, deep calf tenderness, cyanosis or clubbing.  ECOG = 0  0 - Asymptomatic (Fully active, able to carry on all predisease activities without restriction)  1 - Symptomatic but completely ambulatory (Restricted in physically strenuous activity but ambulatory and able to carry out work of a light or sedentary nature. For example, light housework, office work)  2 - Symptomatic, <50% in bed during the day (Ambulatory and capable of all self care but unable to carry out any work activities. Up and about more than 50% of waking hours)  3 - Symptomatic, >50% in bed, but not bedbound (Capable of only limited self-care, confined to bed or chair 50% or more of waking hours)  4 - Bedbound (Completely disabled. Cannot carry on any self-care. Totally confined to bed or chair)  5 - Death   Eustace Pen MM, Creech RH, Tormey DC, et al. [phone]). "Toxicity and response criteria of the [facility]". [place] Oncol. 5 (6): 649-55    LABORATORY DATA:  Lab Results  Component Value Date   WBC  8.8 08/09/2016   HGB 11.5 (L) 08/09/2016   HCT 36.6 08/09/2016   MCV 81.0 08/09/2016   PLT 249 08/09/2016   Lab Results  Component Value Date   NA 140 10/02/2016   K 4.0 10/02/2016   CL 104 10/02/2016   CO2 27 10/02/2016   Lab Results  Component Value Date   ALT 7 05/24/2016   AST 15 05/24/2016   ALKPHOS 72 05/24/2016   BILITOT 0.4 05/24/2016      RADIOGRAPHY: Mr Breast Bilateral W Wo Contrast  Result Date: 09/17/2016 CLINICAL DATA:  Recent diagnosis left breast cancer. LABS:  Creatinine was obtained on site at [facility] at [address]Results: Creatinine 1 mg/dL, GFR 56. EXAM: BILATERAL BREAST MRI WITH AND WITHOUT CONTRAST TECHNIQUE: Multiplanar, multisequence MR images of both breasts were obtained prior to and following the intravenous administration of 18 ml of MultiHance. THREE-DIMENSIONAL MR IMAGE RENDERING ON INDEPENDENT WORKSTATION: Three-dimensional MR images were rendered by post-processing of the original MR data on an independent workstation. The three-dimensional MR images were interpreted, and findings are reported in the following complete MRI report for this study. Three dimensional images were evaluated at the independent DynaCad workstation COMPARISON:  Previous exam(s). FINDINGS: Breast composition: b. Scattered fibroglandular tissue. Background parenchymal enhancement: Minimal Right breast: No mass or abnormal enhancement. Left breast: There is 2.6 x 4.2 x 1.4 cm area of enhancement with washout kinetics at the left breast 2 o'clock posterior depth with associated clip artifact consistent with recently biopsy cancer. There is enhancement along the biopsy tract likely related to post biopsy enhancement. Lymph nodes: No abnormal appearing lymph nodes. Ancillary findings:  None. IMPRESSION: Known cancer. RECOMMENDATION: Treatment plan. BI-RADS CATEGORY  6: Known biopsy-proven malignancy. Electronically Signed   By: Abelardo Diesel M.D.   On: 09/17/2016 11:10     Mm Lt Radioactive Seed Loc Mammo Guide  Result Date: 10/05/2016 CLINICAL DATA:  Preoperative needle localization, prior to left breast lumpectomy for DCIS. EXAM: MAMMOGRAPHIC GUIDED RADIOACTIVE SEED LOCALIZATION OF THE LEFT BREAST COMPARISON:  Previous exam(s). FINDINGS: Patient presents for radioactive seed localization prior to left breast lumpectomy. I met with the patient and we discussed the procedure of seed localization including benefits and alternatives. We discussed the high likelihood of a successful procedure. We discussed the risks  of the procedure including infection, bleeding, tissue injury and further surgery. We discussed the low dose of radioactivity involved in the procedure. Informed, written consent was given. The usual time-out protocol was performed immediately prior to the procedure. Using mammographic guidance, sterile technique, 1% lidocaine and an I-125 radioactive seed, left breast upper outer quadrant coil shaped marker was localized using a lateral approach. The follow-up mammogram images confirm the seed in the expected location and were marked for Dr. Georgette Dover. Follow-up survey of the patient confirms presence of the radioactive seed. Order number of I-125 seed:  [id number]. Total activity:  8.756 millicurie  Reference Date: September 14, 2016 The patient tolerated the procedure well and was released from the [facility]. She was given instructions regarding seed removal. IMPRESSION: Radioactive seed localization left breast. No apparent complications. Electronically Signed   By: Fidela Salisbury M.D.   On: 10/05/2016 14:26    IMPRESSION/PLAN: 1. ER/PR positive, DCIS of the left breast. She is scheduled to undergo left lumpectomy on 10/09/16 by Dr. Georgette Dover and she is also scheduled to see Dr. Burr Medico on 10/19/16. Dr. Lisbeth Renshaw discussed the pathology findings and reviewed the nature of in situ breast disease. Recommendations are for her to undergo lumpectomy. Surgery would be followed  by external radiotherapy to the breast followed by antiestrogen therapy. We discussed the risks, benefits, short, and long term effects of radiotherapy, and the patient is interested in proceeding. Dr. Lisbeth Renshaw discusses the delivery and logistics of radiotherapy. We will see her back about 2 weeks after surgery to move forward with the simulation and planning process and anticipate starting radiotherapy about 4 weeks after surgery.  The above documentation reflects my direct findings during this shared patient visit. Please see the separate note by Dr. Lisbeth Renshaw on this date for the remainder of the patient's plan of care.    Carola Rhine, PAC  This document serves as a record of services personally performed by Shona Simpson, PA-C and Kyung Rudd, MD. It was created on their behalf by Darcus Austin, a trained medical scribe. The creation of this record is based on the scribe's personal observations and the providers' statements to them. This document has been checked and approved by the attending provider.

## 2016-10-08 NOTE — Progress Notes (Signed)
Please see the Nurse Progress Note in the MD Initial Consult Encounter for this patient. 

## 2016-10-08 NOTE — Telephone Encounter (Signed)
Received appt date/time from Andrea.  Mailed new pt packet to pt.  

## 2016-10-09 ENCOUNTER — Ambulatory Visit (HOSPITAL_BASED_OUTPATIENT_CLINIC_OR_DEPARTMENT_OTHER): Payer: Medicare Other | Admitting: Certified Registered"

## 2016-10-09 ENCOUNTER — Ambulatory Visit
Admission: RE | Admit: 2016-10-09 | Discharge: 2016-10-09 | Disposition: A | Discharge status: Home / Self Care | Payer: Medicare Other | Source: Ambulatory Visit | Attending: Surgery | Admitting: Surgery

## 2016-10-09 ENCOUNTER — Encounter (HOSPITAL_BASED_OUTPATIENT_CLINIC_OR_DEPARTMENT_OTHER): Payer: Self-pay | Admitting: *Deleted

## 2016-10-09 ENCOUNTER — Encounter (HOSPITAL_BASED_OUTPATIENT_CLINIC_OR_DEPARTMENT_OTHER)
Admission: RE | Disposition: A | Discharge status: Home / Self Care | Payer: Self-pay | Source: Ambulatory Visit | Attending: Surgery

## 2016-10-09 ENCOUNTER — Ambulatory Visit (HOSPITAL_COMMUNITY)
Admission: RE | Admit: 2016-10-09 | Discharge: 2016-10-09 | Disposition: A | Discharge status: Home / Self Care | Payer: Medicare Other | Source: Ambulatory Visit | Attending: Surgery | Admitting: Surgery

## 2016-10-09 ENCOUNTER — Ambulatory Visit (HOSPITAL_BASED_OUTPATIENT_CLINIC_OR_DEPARTMENT_OTHER)
Admission: RE | Admit: 2016-10-09 | Discharge: 2016-10-09 | Disposition: A | Discharge status: Home / Self Care | Payer: Medicare Other | Source: Ambulatory Visit | Attending: Surgery | Admitting: Surgery

## 2016-10-09 DIAGNOSIS — D0512 Intraductal carcinoma in situ of left breast: Secondary | ICD-10-CM | POA: Insufficient documentation

## 2016-10-09 DIAGNOSIS — K219 Gastro-esophageal reflux disease without esophagitis: Secondary | ICD-10-CM | POA: Diagnosis not present

## 2016-10-09 DIAGNOSIS — Z87442 Personal history of urinary calculi: Secondary | ICD-10-CM | POA: Diagnosis not present

## 2016-10-09 DIAGNOSIS — Z8719 Personal history of other diseases of the digestive system: Secondary | ICD-10-CM | POA: Diagnosis not present

## 2016-10-09 DIAGNOSIS — G4730 Sleep apnea, unspecified: Secondary | ICD-10-CM | POA: Insufficient documentation

## 2016-10-09 DIAGNOSIS — M069 Rheumatoid arthritis, unspecified: Secondary | ICD-10-CM | POA: Diagnosis not present

## 2016-10-09 DIAGNOSIS — Z885 Allergy status to narcotic agent status: Secondary | ICD-10-CM | POA: Insufficient documentation

## 2016-10-09 DIAGNOSIS — Z6832 Body mass index (BMI) 32.0-32.9, adult: Secondary | ICD-10-CM | POA: Diagnosis not present

## 2016-10-09 DIAGNOSIS — N641 Fat necrosis of breast: Secondary | ICD-10-CM | POA: Insufficient documentation

## 2016-10-09 DIAGNOSIS — Z86010 Personal history of colonic polyps: Secondary | ICD-10-CM | POA: Diagnosis not present

## 2016-10-09 DIAGNOSIS — I4891 Unspecified atrial fibrillation: Secondary | ICD-10-CM | POA: Insufficient documentation

## 2016-10-09 DIAGNOSIS — Z8261 Family history of arthritis: Secondary | ICD-10-CM | POA: Diagnosis not present

## 2016-10-09 DIAGNOSIS — N6489 Other specified disorders of breast: Secondary | ICD-10-CM | POA: Insufficient documentation

## 2016-10-09 DIAGNOSIS — Z7901 Long term (current) use of anticoagulants: Secondary | ICD-10-CM | POA: Diagnosis not present

## 2016-10-09 DIAGNOSIS — Z803 Family history of malignant neoplasm of breast: Secondary | ICD-10-CM | POA: Diagnosis not present

## 2016-10-09 DIAGNOSIS — Z888 Allergy status to other drugs, medicaments and biological substances status: Secondary | ICD-10-CM | POA: Diagnosis not present

## 2016-10-09 DIAGNOSIS — I739 Peripheral vascular disease, unspecified: Secondary | ICD-10-CM | POA: Insufficient documentation

## 2016-10-09 DIAGNOSIS — M1990 Unspecified osteoarthritis, unspecified site: Secondary | ICD-10-CM | POA: Diagnosis not present

## 2016-10-09 DIAGNOSIS — Z87891 Personal history of nicotine dependence: Secondary | ICD-10-CM | POA: Insufficient documentation

## 2016-10-09 DIAGNOSIS — I10 Essential (primary) hypertension: Secondary | ICD-10-CM | POA: Insufficient documentation

## 2016-10-09 DIAGNOSIS — E669 Obesity, unspecified: Secondary | ICD-10-CM | POA: Diagnosis not present

## 2016-10-09 DIAGNOSIS — F419 Anxiety disorder, unspecified: Secondary | ICD-10-CM | POA: Insufficient documentation

## 2016-10-09 DIAGNOSIS — Z9884 Bariatric surgery status: Secondary | ICD-10-CM | POA: Diagnosis not present

## 2016-10-09 DIAGNOSIS — I712 Thoracic aortic aneurysm, without rupture: Secondary | ICD-10-CM | POA: Diagnosis not present

## 2016-10-09 HISTORY — PX: BREAST LUMPECTOMY WITH RADIOACTIVE SEED AND SENTINEL LYMPH NODE BIOPSY: SHX6550

## 2016-10-09 HISTORY — DX: Gastro-esophageal reflux disease without esophagitis: K21.9

## 2016-10-09 LAB — POCT HEMOGLOBIN-HEMACUE: HEMOGLOBIN: 11.1 g/dL — AB (ref 12.0–15.0)

## 2016-10-09 SURGERY — BREAST LUMPECTOMY WITH RADIOACTIVE SEED AND SENTINEL LYMPH NODE BIOPSY
Anesthesia: Regional | Site: Breast | Laterality: Left

## 2016-10-09 MED ORDER — MORPHINE SULFATE (PF) 4 MG/ML IV SOLN
4.0000 mg | Freq: Once | INTRAVENOUS | Status: AC
  Administered 2016-10-09: 1 mg via INTRAVENOUS

## 2016-10-09 MED ORDER — CEFAZOLIN SODIUM-DEXTROSE 2-4 GM/100ML-% IV SOLN
INTRAVENOUS | Status: AC
  Filled 2016-10-09: qty 100

## 2016-10-09 MED ORDER — DEXAMETHASONE SODIUM PHOSPHATE 10 MG/ML IJ SOLN
INTRAMUSCULAR | Status: AC
  Filled 2016-10-09: qty 1

## 2016-10-09 MED ORDER — BUPIVACAINE HCL (PF) 0.25 % IJ SOLN
INTRAMUSCULAR | Status: AC
  Filled 2016-10-09: qty 30

## 2016-10-09 MED ORDER — SCOPOLAMINE 1 MG/3DAYS TD PT72: 1.0000 | MEDICATED_PATCH | Freq: Once | TRANSDERMAL | Status: DC | PRN

## 2016-10-09 MED ORDER — CHLORHEXIDINE GLUCONATE CLOTH 2 % EX PADS: 6.0000 | MEDICATED_PAD | Freq: Once | CUTANEOUS | Status: DC

## 2016-10-09 MED ORDER — PROPOFOL 10 MG/ML IV BOLUS
INTRAVENOUS | Status: DC | PRN
  Administered 2016-10-09: 160 mg via INTRAVENOUS

## 2016-10-09 MED ORDER — KETOROLAC TROMETHAMINE 30 MG/ML IJ SOLN
30.0000 mg | Freq: Once | INTRAMUSCULAR | Status: AC
  Administered 2016-10-09: 30 mg via INTRAVENOUS

## 2016-10-09 MED ORDER — MEPERIDINE HCL 25 MG/ML IJ SOLN: 6.2500 mg | INTRAMUSCULAR | Status: DC | PRN

## 2016-10-09 MED ORDER — PROPOFOL 500 MG/50ML IV EMUL
INTRAVENOUS | Status: AC
  Filled 2016-10-09: qty 50

## 2016-10-09 MED ORDER — BUPIVACAINE-EPINEPHRINE (PF) 0.5% -1:200000 IJ SOLN
INTRAMUSCULAR | Status: DC | PRN
  Administered 2016-10-09: 30 mL

## 2016-10-09 MED ORDER — FENTANYL CITRATE (PF) 100 MCG/2ML IJ SOLN
INTRAMUSCULAR | Status: AC
  Filled 2016-10-09: qty 2

## 2016-10-09 MED ORDER — OXYCODONE HCL 5 MG PO TABS
5.0000 mg | ORAL_TABLET | Freq: Once | ORAL | Status: AC | PRN
  Administered 2016-10-09: 5 mg via ORAL

## 2016-10-09 MED ORDER — LIDOCAINE HCL (CARDIAC) 20 MG/ML IV SOLN
INTRAVENOUS | Status: DC | PRN
  Administered 2016-10-09: 60 mg via INTRAVENOUS

## 2016-10-09 MED ORDER — DEXAMETHASONE SODIUM PHOSPHATE 4 MG/ML IJ SOLN
INTRAMUSCULAR | Status: DC | PRN
  Administered 2016-10-09: 10 mg via INTRAVENOUS

## 2016-10-09 MED ORDER — EPINEPHRINE 30 MG/30ML IJ SOLN
INTRAMUSCULAR | Status: AC
  Filled 2016-10-09: qty 1

## 2016-10-09 MED ORDER — HYDROMORPHONE HCL 1 MG/ML IJ SOLN
0.2500 mg | INTRAMUSCULAR | Status: DC | PRN
  Administered 2016-10-09: 0.5 mg via INTRAVENOUS

## 2016-10-09 MED ORDER — PROMETHAZINE HCL 25 MG/ML IJ SOLN: 6.2500 mg | INTRAMUSCULAR | Status: DC | PRN

## 2016-10-09 MED ORDER — METHYLENE BLUE 0.5 % INJ SOLN
INTRAVENOUS | Status: DC | PRN
  Administered 2016-10-09: 5 mL

## 2016-10-09 MED ORDER — OXYCODONE HCL 5 MG PO TABS
ORAL_TABLET | ORAL | Status: AC
  Filled 2016-10-09: qty 1

## 2016-10-09 MED ORDER — TECHNETIUM TC 99M SULFUR COLLOID FILTERED
1.0000 | Freq: Once | INTRAVENOUS | Status: AC | PRN
  Administered 2016-10-09: 1 via INTRADERMAL

## 2016-10-09 MED ORDER — KETOROLAC TROMETHAMINE 30 MG/ML IJ SOLN
INTRAMUSCULAR | Status: AC
  Filled 2016-10-09: qty 1

## 2016-10-09 MED ORDER — OXYCODONE HCL 5 MG/5ML PO SOLN: 5.0000 mg | Freq: Once | ORAL | Status: AC | PRN

## 2016-10-09 MED ORDER — LACTATED RINGERS IV SOLN
INTRAVENOUS | Status: DC
  Administered 2016-10-09 (×2): via INTRAVENOUS

## 2016-10-09 MED ORDER — HYDROMORPHONE HCL 1 MG/ML IJ SOLN
INTRAMUSCULAR | Status: AC
  Filled 2016-10-09: qty 1

## 2016-10-09 MED ORDER — TRAMADOL HCL 50 MG PO TABS: 50.0000 mg | ORAL_TABLET | Freq: Four times a day (QID) | ORAL | 0 refills | Status: DC | PRN

## 2016-10-09 MED ORDER — LIDOCAINE 2% (20 MG/ML) 5 ML SYRINGE
INTRAMUSCULAR | Status: AC
  Filled 2016-10-09: qty 5

## 2016-10-09 MED ORDER — MIDAZOLAM HCL 2 MG/2ML IJ SOLN
INTRAMUSCULAR | Status: AC
  Filled 2016-10-09: qty 2

## 2016-10-09 MED ORDER — SODIUM CHLORIDE 0.9 % IJ SOLN
INTRAMUSCULAR | Status: AC
  Filled 2016-10-09: qty 10

## 2016-10-09 MED ORDER — EPHEDRINE 5 MG/ML INJ
INTRAVENOUS | Status: AC
  Filled 2016-10-09: qty 10

## 2016-10-09 MED ORDER — ONDANSETRON HCL 4 MG/2ML IJ SOLN
INTRAMUSCULAR | Status: DC | PRN
Start: 2016-10-09 — End: 2016-10-09
  Administered 2016-10-09: 4 mg via INTRAVENOUS

## 2016-10-09 MED ORDER — CEFAZOLIN SODIUM-DEXTROSE 2-4 GM/100ML-% IV SOLN
2.0000 g | INTRAVENOUS | Status: AC
  Administered 2016-10-09: 2 g via INTRAVENOUS

## 2016-10-09 MED ORDER — MORPHINE SULFATE (PF) 2 MG/ML IV SOLN
1.0000 mg | INTRAVENOUS | Status: DC | PRN
  Administered 2016-10-09: 2 mg via INTRAVENOUS

## 2016-10-09 MED ORDER — MIDAZOLAM HCL 2 MG/2ML IJ SOLN
1.0000 mg | INTRAMUSCULAR | Status: DC | PRN
  Administered 2016-10-09: 2 mg via INTRAVENOUS

## 2016-10-09 MED ORDER — MORPHINE SULFATE (PF) 4 MG/ML IV SOLN
INTRAVENOUS | Status: AC
  Filled 2016-10-09: qty 1

## 2016-10-09 MED ORDER — FENTANYL CITRATE (PF) 100 MCG/2ML IJ SOLN
50.0000 ug | INTRAMUSCULAR | Status: AC | PRN
  Administered 2016-10-09 (×3): 25 ug via INTRAVENOUS
  Administered 2016-10-09: 100 ug via INTRAVENOUS
  Administered 2016-10-09: 25 ug via INTRAVENOUS

## 2016-10-09 MED ORDER — ONDANSETRON HCL 4 MG/2ML IJ SOLN
INTRAMUSCULAR | Status: AC
  Filled 2016-10-09: qty 2

## 2016-10-09 MED ORDER — BUPIVACAINE-EPINEPHRINE 0.25% -1:200000 IJ SOLN
INTRAMUSCULAR | Status: DC | PRN
  Administered 2016-10-09: 10 mL

## 2016-10-09 SURGICAL SUPPLY — 57 items
APPLIER CLIP 9.375 MED OPEN (MISCELLANEOUS)
BENZOIN TINCTURE PRP APPL 2/3 (GAUZE/BANDAGES/DRESSINGS) ×2 IMPLANT
BINDER BREAST 3XL (BIND) IMPLANT
BINDER BREAST LRG (GAUZE/BANDAGES/DRESSINGS) IMPLANT
BINDER BREAST MEDIUM (GAUZE/BANDAGES/DRESSINGS) IMPLANT
BINDER BREAST XLRG (GAUZE/BANDAGES/DRESSINGS) IMPLANT
BINDER BREAST XXLRG (GAUZE/BANDAGES/DRESSINGS) IMPLANT
BLADE CLIPPER SURG (BLADE) IMPLANT
BLADE HEX COATED 2.75 (ELECTRODE) ×2 IMPLANT
BLADE SURG 10 STRL SS (BLADE) IMPLANT
BLADE SURG 15 STRL LF DISP TIS (BLADE) ×1 IMPLANT
BLADE SURG 15 STRL SS (BLADE) ×2
CANISTER SUCT 1200ML W/VALVE (MISCELLANEOUS) IMPLANT
CHLORAPREP W/TINT 26ML (MISCELLANEOUS) ×2 IMPLANT
CLIP APPLIE 9.375 MED OPEN (MISCELLANEOUS) IMPLANT
COVER BACK TABLE 60X90IN (DRAPES) ×2 IMPLANT
COVER MAYO STAND STRL (DRAPES) ×2 IMPLANT
COVER PROBE W GEL 5X96 (DRAPES) ×2 IMPLANT
DECANTER SPIKE VIAL GLASS SM (MISCELLANEOUS) IMPLANT
DEVICE DUBIN W/COMP PLATE 8390 (MISCELLANEOUS) ×2 IMPLANT
DRAPE LAPAROSCOPIC ABDOMINAL (DRAPES) ×2 IMPLANT
DRAPE UTILITY XL STRL (DRAPES) ×2 IMPLANT
DRSG TEGADERM 4X4.75 (GAUZE/BANDAGES/DRESSINGS) ×4 IMPLANT
ELECT BLADE 6.5 .24CM SHAFT (ELECTRODE) ×2 IMPLANT
ELECT REM PT RETURN 9FT ADLT (ELECTROSURGICAL) ×2
ELECTRODE REM PT RTRN 9FT ADLT (ELECTROSURGICAL) ×1 IMPLANT
GLOVE BIO SURGEON STRL SZ7 (GLOVE) ×2 IMPLANT
GLOVE BIOGEL PI IND STRL 7.0 (GLOVE) ×2 IMPLANT
GLOVE BIOGEL PI IND STRL 7.5 (GLOVE) ×1 IMPLANT
GLOVE BIOGEL PI INDICATOR 7.0 (GLOVE) ×2
GLOVE BIOGEL PI INDICATOR 7.5 (GLOVE) ×1
GLOVE ECLIPSE 6.5 STRL STRAW (GLOVE) ×2 IMPLANT
GOWN STRL REUS W/ TWL LRG LVL3 (GOWN DISPOSABLE) ×2 IMPLANT
GOWN STRL REUS W/TWL LRG LVL3 (GOWN DISPOSABLE) ×4
ILLUMINATOR WAVEGUIDE N/F (MISCELLANEOUS) ×2 IMPLANT
KIT MARKER MARGIN INK (KITS) ×2 IMPLANT
LIGHT WAVEGUIDE WIDE FLAT (MISCELLANEOUS) IMPLANT
NDL SAFETY ECLIPSE 18X1.5 (NEEDLE) ×1 IMPLANT
NEEDLE HYPO 18GX1.5 SHARP (NEEDLE) ×1
NEEDLE HYPO 25X1 1.5 SAFETY (NEEDLE) ×4 IMPLANT
NS IRRIG 1000ML POUR BTL (IV SOLUTION) IMPLANT
PACK BASIN DAY SURGERY FS (CUSTOM PROCEDURE TRAY) ×2 IMPLANT
PENCIL BUTTON HOLSTER BLD 10FT (ELECTRODE) ×2 IMPLANT
SLEEVE SCD COMPRESS KNEE MED (MISCELLANEOUS) ×2 IMPLANT
SPONGE GAUZE 4X4 12PLY STER LF (GAUZE/BANDAGES/DRESSINGS) IMPLANT
SPONGE LAP 18X18 X RAY DECT (DISPOSABLE) IMPLANT
SPONGE LAP 4X18 X RAY DECT (DISPOSABLE) ×2 IMPLANT
STRIP CLOSURE SKIN 1/2X4 (GAUZE/BANDAGES/DRESSINGS) ×2 IMPLANT
SUT MON AB 4-0 PC3 18 (SUTURE) ×2 IMPLANT
SUT SILK 2 0 SH (SUTURE) IMPLANT
SUT VIC AB 3-0 SH 27 (SUTURE) ×1
SUT VIC AB 3-0 SH 27X BRD (SUTURE) ×1 IMPLANT
SYR CONTROL 10ML LL (SYRINGE) ×4 IMPLANT
TOWEL OR 17X24 6PK STRL BLUE (TOWEL DISPOSABLE) ×2 IMPLANT
TOWEL OR NON WOVEN STRL DISP B (DISPOSABLE) IMPLANT
TUBE CONNECTING 20X1/4 (TUBING) IMPLANT
YANKAUER SUCT BULB TIP NO VENT (SUCTIONS) IMPLANT

## 2016-10-09 NOTE — Anesthesia Postprocedure Evaluation (Signed)
Anesthesia Post Note  Patient: Christina Hardin  Procedure(s) Performed: Procedure(s) (LRB): BREAST LUMPECTOMY WITH RADIOACTIVE SEED AND SENTINEL LYMPH NODE BIOPSY (Left)  Patient location during evaluation: PACU Anesthesia Type: General Level of consciousness: awake and alert Pain management: pain level controlled Vital Signs Assessment: post-procedure vital signs reviewed and stable Respiratory status: spontaneous breathing, nonlabored ventilation, respiratory function stable and patient connected to nasal cannula oxygen Cardiovascular status: blood pressure returned to baseline and stable Postop Assessment: no signs of nausea or vomiting Anesthetic complications: no    Last Vitals:  Vitals:   10/09/16 1545 10/09/16 1600  BP: (!) 131/94   Pulse: 68 65  Resp: 20 12  Temp:      Last Pain:  Vitals:   10/09/16 1600  TempSrc:   PainSc: 6                  Mihir Flanigan,JAMES TERRILL

## 2016-10-09 NOTE — Progress Notes (Signed)
Assisted nuc med tech # [id number] with nuc med inj. Side rails up, monitors on throughout procedure. See vital signs in flow sheet. Tolerated Procedure well. 

## 2016-10-09 NOTE — Interval H&P Note (Signed)
History and Physical Interval Note:  10/09/2016 2:47 PM  Christina Hardin  has presented today for surgery, with the diagnosis of LEFT BREAST DUCTAL CARCINOMA IN SITU  The various methods of treatment have been discussed with the patient and family. After consideration of risks, benefits and other options for treatment, the patient has consented to  Procedure(s): BREAST LUMPECTOMY WITH RADIOACTIVE SEED AND SENTINEL LYMPH NODE BIOPSY (Left) as a surgical intervention .  The patient's history has been reviewed, patient examined, no change in status, stable for surgery.  I have reviewed the patient's chart and labs.  Questions were answered to the patient's satisfaction.     Precilla Purnell K.

## 2016-10-09 NOTE — Anesthesia Procedure Notes (Signed)
Procedure Name: LMA Insertion Date/Time: 10/09/2016 1:25 PM Performed by: Kalenna Millett D Pre-anesthesia Checklist: Patient identified, Emergency Drugs available, Suction available and Patient being monitored Patient Re-evaluated:Patient Re-evaluated prior to inductionOxygen Delivery Method: Circle system utilized Preoxygenation: Pre-oxygenation with 100% oxygen Intubation Type: IV induction Ventilation: Mask ventilation without difficulty LMA: LMA inserted LMA Size: 4.0 Number of attempts: 1 Airway Equipment and Method: Bite block Placement Confirmation: positive ETCO2 Tube secured with: Tape Dental Injury: Teeth and Oropharynx as per pre-operative assessment

## 2016-10-09 NOTE — Progress Notes (Signed)
Assisted Dr. Germeroth with left, ultrasound guided, pectoralis block. Side rails up, monitors on throughout procedure. See vital signs in flow sheet. Tolerated Procedure well. 

## 2016-10-09 NOTE — Anesthesia Procedure Notes (Signed)
Anesthesia Regional Block:  Pectoralis block  Pre-Anesthetic Checklist: ,, timeout performed, Correct Patient, Correct Site, Correct Laterality, Correct Procedure, Correct Position, site marked, Risks and benefits discussed,  Surgical consent,  Pre-op evaluation,  At surgeon's request and post-op pain management  Laterality: Left  Prep: chloraprep       Needles:   Needle Type: Stimiplex     Needle Length: 9cm 9 cm     Additional Needles:  Procedures: ultrasound guided (picture in chart) Pectoralis block Narrative:  Injection made incrementally with aspirations every 5 mL.  Performed by: Personally  Anesthesiologist: Nolon Nations  Additional Notes: Patient tolerated well. Good fascial spread noted.

## 2016-10-09 NOTE — Anesthesia Preprocedure Evaluation (Signed)
Anesthesia Evaluation  Patient identified by MRN, date of birth, ID band Patient awake    Reviewed: Allergy & Precautions, NPO status , Patient's Chart, lab work & pertinent test results  Airway Mallampati: II  TM Distance: >3 FB Neck ROM: Full    Dental no notable dental hx.    Pulmonary sleep apnea ,    Pulmonary exam normal breath sounds clear to auscultation       Cardiovascular hypertension, + Peripheral Vascular Disease  Normal cardiovascular exam Rhythm:Regular Rate:Normal  Echo 06/2016 - Left ventricle: The cavity size was normal. Wall thickness was normal. Systolic function was normal. The estimated ejection fraction was in the range of 55% to 60%. Wall motion was normal; there were no regional wall motion abnormalities. Features are consistent with a pseudonormal left ventricular filling pattern, with concomitant abnormal relaxation and increased filling pressure (grade 2 diastolic dysfunction).   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    Neuro/Psych  Headaches, negative psych ROS   GI/Hepatic negative GI ROS, Neg liver ROS, GERD  ,  Endo/Other  negative endocrine ROS  Renal/GU Renal diseasenegative Renal ROS     Musculoskeletal  (+) Arthritis , Rheumatoid disorders,    Abdominal (+) + obese,   Peds  Hematology  (+) anemia ,   Anesthesia Other Findings   Reproductive/Obstetrics negative OB ROS                            Anesthesia Physical Anesthesia Plan  ASA: III  Anesthesia Plan: General and Regional   Post-op Pain Management: GA combined w/ Regional for post-op pain   Induction: Intravenous  Airway Management Planned: LMA  Additional Equipment:   Intra-op Plan:   Post-operative Plan: Extubation in OR  Informed Consent: I have reviewed the patients History and Physical, chart, labs and discussed the procedure including the risks, benefits and alternatives for the  proposed anesthesia with the patient or authorized representative who has indicated his/her understanding and acceptance.   Dental advisory given  Plan Discussed with: CRNA  Anesthesia Plan Comments:       Anesthesia Quick Evaluation

## 2016-10-09 NOTE — Transfer of Care (Signed)
Immediate Anesthesia Transfer of Care Note  Patient: Christina Hardin  Procedure(s) Performed: Procedure(s): BREAST LUMPECTOMY WITH RADIOACTIVE SEED AND SENTINEL LYMPH NODE BIOPSY (Left)  Patient Location: PACU  Anesthesia Type:GA combined with regional for post-op pain  Level of Consciousness: awake, alert , oriented and patient cooperative  Airway & Oxygen Therapy: Patient Spontanous Breathing and Patient connected to face mask oxygen  Post-op Assessment: Report given to RN and Post -op Vital signs reviewed and stable  Post vital signs: Reviewed and stable  Last Vitals:  Vitals:   10/09/16 1449 10/09/16 1450  BP:    Pulse: 89 82  Resp: (P) 20 19  Temp: (P) 37.1 C     Last Pain:  Vitals:   10/09/16 1123  TempSrc: Oral  PainSc: 0-No pain      Patients Stated Pain Goal: 0 ([id number])  Complications: No apparent anesthesia complications

## 2016-10-09 NOTE — H&P (View-Only) (Signed)
History of Present Illness Christina Hardin. Christina Crittendon MD; 09/10/2016 12:49 PM) Patient words: breast cancer.  The patient is a 65 year old female who presents with breast cancer. 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  This is a 65 year old female with no significant past history of breast problems who presents after recent routine mammogram. She has some suspicious findings in the upper outer quadrant of the left breast. On 09/04/16 she underwent tomo diagnostic mammogram on the left as well as ultrasound. Ultrasound was unable to identify this area. The axilla was negative for adenopathy. Tomo synthesis showed a 2.0 x 1.4 cm area of microcalcifications with some mild distortion. She underwent stereotactic biopsy on 10/4. This showed DCIS. Prognostic panel is pending. She is now referred for surgical evaluation. The patient denies any recent breast symptoms. She has had no breast pain or nipple drainage.  Menarche age 47 First pregnancy age 9 Breast-feeding yes Hormones less than 6 months after her hysterectomy The patient had a hysterectomy with bilateral salpingo-oophorectomy at age 68 Family history breast cancer in a maternal aunt  The patient was recently diagnosed with a thoracic aortic aneurysm that is being followed by Dr. Debara Pickett. She is anticoagulated for atrial fibrillation.   CLINICAL DATA: Screening.  EXAM: DIGITAL SCREENING BILATERAL MAMMOGRAM WITH CAD  COMPARISON: Previous exam(s).  ACR Breast Density Category b: There are scattered areas of fibroglandular density.  FINDINGS: In the left breast, a possible mass warrants further evaluation. In the right breast, no findings suspicious for malignancy.  Images were processed with CAD.  IMPRESSION: Further evaluation is suggested for possible mass in the left breast.  RECOMMENDATION: Diagnostic mammogram and possibly ultrasound of the left breast. (Code:[id number])  The patient will be contacted regarding the  findings, and additional imaging will be scheduled.  BI-RADS CATEGORY 0: Incomplete. Need additional imaging evaluation and/or prior mammograms for comparison.   Electronically Signed By: Marin Olp M.D. On: 08/28/2016 13:47  CLINICAL DATA: Patient returns after screening study for evaluation of possible left breast mass.  EXAM: 2D DIGITAL DIAGNOSTIC LEFT MAMMOGRAM WITH CAD AND ADJUNCT TOMO  ULTRASOUND LEFT BREAST  COMPARISON: Prior studies including most recent 08/27/2016  ACR Breast Density Category b: There are scattered areas of fibroglandular density.  FINDINGS: Additional tomosynthesis images are performed which demonstrate persistent vague density in the upper-outer quadrant of the left breast measuring 2.0 x 1.4 cm, associated with microcalcifications. Density is associated with possible mild distortion.  Mammographic images were processed with CAD.  On physical exam, I palpate no abnormality in the upper-outer quadrant of the left breast.  Targeted ultrasound is performed, showing normal appearing fibroglandular tissue throughout the lateral aspect of the left breast. Evaluation of the axilla is negative for adenopathy.  IMPRESSION: Persistent mammographic density without associated ultrasound correlate. Image guided core biopsy is recommended.  No adenopathy identified by ultrasound.  RECOMMENDATION: Stereotactic guided core biopsy is recommended and scheduled for the patient on 09/05/2016.  I have discussed the findings and recommendations with the patient. Results were also provided in writing at the conclusion of the visit. If applicable, a reminder letter will be sent to the patient regarding the next appointment.  BI-RADS CATEGORY 4: Suspicious.   Electronically Signed By: Nolon Nations M.D. On: 09/04/2016 14:49  CLINICAL DATA: Asymmetry with calcifications within the left breast.  EXAM: LEFT BREAST STEREOTACTIC CORE  NEEDLE BIOPSY  COMPARISON: Previous exams.  FINDINGS: The patient and I discussed the procedure of stereotactic-guided biopsy including benefits and alternatives. We discussed  the high likelihood of a successful procedure. We discussed the risks of the procedure including infection, bleeding, tissue injury, clip migration, and inadequate sampling. Informed written consent was given. The usual time out protocol was performed immediately prior to the procedure.  Using sterile technique and 1% Lidocaine as local anesthetic, under stereotactic guidance, a 9 gauge vacuum assisted device was used to perform core needle biopsy of calcifications with asymmetry located within the left upper outer quadrant using a lateral approach. Specimen radiograph was performed showing representative calcifications within the specimen. Specimens with calcifications are identified for pathology.  At the conclusion of the procedure, a coil shaped tissue marker clip was deployed into the biopsy cavity. Follow-up 2-view mammogram was performed and dictated separately.  IMPRESSION: Stereotactic-guided biopsy of left breast calcifications with asymmetry as discussed above. No apparent complications.   Electronically Signed By: Altamese Cabal M.D. On: 09/05/2016 16:37  Diagnosis Breast, left, needle core biopsy, UOQ - DUCTAL CARCINOMA IN SITU WITH CALCIFICATIONS. - SEE COMMENT. Microscopic Comment The carcinoma appears intermediate grade. Estrogen receptor and progesterone receptor studies will be performed and the results reported separately. The results were called to the [facility] on 09/06/2016. (JBM:kh 09/06/16) Christina Cutter MD Pathologist, Electronic Signature (Case signed 09/06/2016)   Other Problems ([name], LPN; 624THL QA348G AM) Anxiety Disorder Arthritis Atrial Fibrillation Back Pain Diverticulosis Gastroesophageal Reflux Disease High blood  pressure Kidney Stone Oophorectomy Bilateral. Other disease, cancer, significant illness  Past Surgical History ([name], LPN; 624THL QA348G AM) Appendectomy Breast Biopsy Left. Colon Polyp Removal - Colonoscopy Gastric Bypass Hysterectomy (not due to cancer) - Complete Oral Surgery  Diagnostic Studies History ([name], LPN; 624THL QA348G AM) Colonoscopy 1-5 years ago Mammogram within last year Pap Smear >5 years ago  Allergies ([name], LPN; 624THL 579FGE AM) Dilaudid *ANALGESICS - OPIOID* Propoxyphene-APAP *ANALGESICS - OPIOID*  Medication History ([name], LPN; 624THL [id number] AM) Eliquis (5MG  Tablet, Oral) Active. Famotidine (40MG  Tablet, Oral) Active. Ferrex 150 (150MG  Capsule, Oral) Active. Metoprolol Succinate ER (25MG  Tablet ER 24HR, Oral) Active. HydroCHLOROthiazide (25MG  Tablet, Oral) Active. Vitamin B12 (100MCG Tablet, Oral) Active. Potassium (99MG  Tablet, Oral) Active. Multiple Vitamin (Oral) Active. Calcium (500MG  Tablet, Oral) Active. Tylenol (500MG  Capsule, Oral) Active. Medications Reconciled  Social History ([name], LPN; 624THL QA348G AM) Caffeine use Carbonated beverages. No alcohol use No drug use Tobacco use Former smoker.  Family History Aleatha Borer, LPN; 624THL QA348G AM) Arthritis Mother, Sister. Cancer Father. Colon Polyps Mother. Heart Disease Mother. Heart disease in female family member before age 40 Hypertension Brother, Mother, Sister, Son. Melanoma Father. Migraine Headache Father. Respiratory Condition Father.  Pregnancy / Birth History Aleatha Borer, LPN; 624THL QA348G AM) Age at menarche 77 years. Gravida 6 Length (months) of breastfeeding 3-6 Maternal age 62-20 Para 3     Review of Systems ([name] LPN; 624THL QA348G AM) General Present- Fatigue. Not Present- Appetite Loss, Chills, Fever, Night Sweats, Weight Gain and  Weight Loss. Cardiovascular Present- Palpitations and Shortness of Breath. Not Present- Chest Pain, Difficulty Breathing Lying Down, Leg Cramps, Rapid Heart Rate and Swelling of Extremities. Musculoskeletal Present- Back Pain and Joint Pain. Not Present- Joint Stiffness, Muscle Pain, Muscle Weakness and Swelling of Extremities. Hematology Present- Blood Thinners. Not Present- Easy Bruising, Excessive bleeding, Gland problems, HIV and Persistent Infections.  Vitals ([name] LPN; 624THL 579FGE AM) 09/10/2016 11:10 AM Weight: 191.4 lb Height: 64.5in Body Surface Area: 1.93 m Body Mass Index: 32.35 kg/m  Temp.: 98.61F(Oral)  Pulse: 74 (Regular)  BP: 148/82 (Sitting, Left Arm, Standard)      Physical Exam Rodman Key K. Witney Huie MD; 09/10/2016 12:49 PM)  The physical exam findings are as follows: Note:WDWN in NAD Eyes: Pupils equal, round; sclera anicteric HENT: Oral mucosa moist; good dentition Neck: No masses palpated, no thyromegaly Lungs: CTA bilaterally; normal respiratory effort Breasts: symmetric; no nipple retraction or discharge NO axillary lymphadenopathy No palpable masses in either breast CV: Regular rate and rhythm; no murmurs; extremities well-perfused with no edema Abd: +bowel sounds, soft, non-tender, no palpable organomegaly; no palpable hernias Skin: Warm, dry; no sign of jaundice Psychiatric - alert and oriented x 4; calm mood and affect    Assessment & Plan Rodman Key K. Meleni Delahunt MD; 09/10/2016 12:51 PM)  DUCTAL CARCINOMA IN SITU (DCIS) OF LEFT BREAST (D05.12)  Current Plans MRI, BOTH BREASTS [id number]) Instructed to make follow-up appointment for office visit following completion of diagnostic tests Note:I spent over 30 minutes of the 40 minute visit in discussion with the patient and her sister, explaining her disease, the options for surgical treatment, and the recommendation for MRI prior to scheduling surgery.  We will obtain cardiac clearance  from Dr. Debara Pickett as she will need to hold her anticoagulation.  If the MRI confirms a single site of unilateral disease, we will proceed with left radioactive seed localized lumpectomy with left axillary sentinel lymph node biopsy. Since the lump is in the upper outer quadrant, I'm concerned that we would be unable to perform a sentinel lymph node biopsy if indicated if the final pathology returns with invasive disease. I explained this to the patient and she understands the recommendation for sentinel lymph node biopsy at the time of the initial surgery. The surgical procedure has been discussed with the patient. Potential risks, benefits, alternative treatments, and expected outcomes have been explained. All of the patient's questions at this time have been answered. The likelihood of reaching the patient's treatment goal is good. The patient understand the proposed surgical procedure and wishes to proceed.  Christina Hardin. Georgette Dover, MD, [facility]  General/ Trauma Surgery  09/10/2016 12:52 PM

## 2016-10-09 NOTE — Discharge Instructions (Signed)
[facility] °Office Phone Number [phone] ° °BREAST BIOPSY/ PARTIAL MASTECTOMY: POST OP INSTRUCTIONS ° °Always review your discharge instruction sheet given to you by the facility where your surgery was performed. ° °IF YOU HAVE DISABILITY OR FAMILY LEAVE FORMS, YOU MUST BRING THEM TO THE OFFICE FOR PROCESSING.  DO NOT GIVE THEM TO YOUR DOCTOR. ° °1. A prescription for pain medication may be given to you upon discharge.  Take your pain medication as prescribed, if needed.  If narcotic pain medicine is not needed, then you may take acetaminophen (Tylenol) or ibuprofen (Advil) as needed. °2. Take your usually prescribed medications unless otherwise directed °3. If you need a refill on your pain medication, please contact your pharmacy.  They will contact our office to request authorization.  Prescriptions will not be filled after 5pm or on week-ends. °4. You should eat very light the first 24 hours after surgery, such as soup, crackers, pudding, etc.  Resume your normal diet the day after surgery. °5. Most patients will experience some swelling and bruising in the breast.  Ice packs and a good support bra will help.  Swelling and bruising can take several days to resolve.  °6. It is common to experience some constipation if taking pain medication after surgery.  Increasing fluid intake and taking a stool softener will usually help or prevent this problem from occurring.  A mild laxative (Milk of Magnesia or Miralax) should be taken according to package directions if there are no bowel movements after 48 hours. °7. Unless discharge instructions indicate otherwise, you may remove your bandages 24-48 hours after surgery, and you may shower at that time.  You may have steri-strips (small skin tapes) in place directly over the incision.  These strips should be left on the skin for 7-10 days.  If your surgeon used skin glue on the incision, you may shower in 24 hours.  The glue will flake off over the  next 2-3 weeks.  Any sutures or staples will be removed at the office during your follow-up visit. °8. ACTIVITIES:  You may resume regular daily activities (gradually increasing) beginning the next day.  Wearing a good support bra or sports bra minimizes pain and swelling.  You may have sexual intercourse when it is comfortable. °a. You may drive when you no longer are taking prescription pain medication, you can comfortably wear a seatbelt, and you can safely maneuver your car and apply brakes. °b. RETURN TO WORK:  ______________________________________________________________________________________ °9. You should see your doctor in the office for a follow-up appointment approximately two weeks after your surgery.  Your doctor’s nurse will typically make your follow-up appointment when she calls you with your pathology report.  Expect your pathology report 2-3 business days after your surgery.  You may call to check if you do not hear from us after three days. °10. OTHER INSTRUCTIONS: _______________________________________________________________________________________________ _____________________________________________________________________________________________________________________________________ °_____________________________________________________________________________________________________________________________________ °_____________________________________________________________________________________________________________________________________ ° °WHEN TO CALL YOUR DOCTOR: °1. Fever over 101.0 °2. Nausea and/or vomiting. °3. Extreme swelling or bruising. °4. Continued bleeding from incision. °5. Increased pain, redness, or drainage from the incision. ° °The clinic staff is available to answer your questions during regular business hours.  Please don’t hesitate to call and ask to speak to one of the nurses for clinical concerns.  If you have a medical emergency, go to the nearest  emergency room or call 911.  A surgeon from [facility] is always on call at the hospital. ° °For further questions, please visit [email or web address]  ° ° °  Post Anesthesia Home Care Instructions ° °Activity: °Get plenty of rest for the remainder of the day. A responsible adult should stay with you for 24 hours following the procedure.  °For the next 24 hours, DO NOT: °-Drive a car °-Operate machinery °-Drink alcoholic beverages °-Take any medication unless instructed by your physician °-Make any legal decisions or sign important papers. ° °Meals: °Start with liquid foods such as gelatin or soup. Progress to regular foods as tolerated. Avoid greasy, spicy, heavy foods. If nausea and/or vomiting occur, drink only clear liquids until the nausea and/or vomiting subsides. Call your physician if vomiting continues. ° °Special Instructions/Symptoms: °Your throat may feel dry or sore from the anesthesia or the breathing tube placed in your throat during surgery. If this causes discomfort, gargle with warm salt water. The discomfort should disappear within 24 hours. ° °If you had a scopolamine patch placed behind your ear for the management of post- operative nausea and/or vomiting: ° °1. The medication in the patch is effective for 72 hours, after which it should be removed.  Wrap patch in a tissue and discard in the trash. Wash hands thoroughly with soap and water. °2. You may remove the patch earlier than 72 hours if you experience unpleasant side effects which may include dry mouth, dizziness or visual disturbances. °3. Avoid touching the patch. Wash your hands with soap and water after contact with the patch. °  ° °Regional Anesthesia Blocks ° °1. Numbness or the inability to move the "blocked" extremity may last from 3-48 hours after placement. The length of time depends on the medication injected and your individual response to the medication. If the numbness is not going away after 48 hours, call  your surgeon. ° °2. The extremity that is blocked will need to be protected until the numbness is gone and the  Strength has returned. Because you cannot feel it, you will need to take extra care to avoid injury. Because it may be weak, you may have difficulty moving it or using it. You may not know what position it is in without looking at it while the block is in effect. ° °3. For blocks in the legs and feet, returning to weight bearing and walking needs to be done carefully. You will need to wait until the numbness is entirely gone and the strength has returned. You should be able to move your leg and foot normally before you try and bear weight or walk. You will need someone to be with you when you first try to ensure you do not fall and possibly risk injury. ° °4. Bruising and tenderness at the needle site are common side effects and will resolve in a few days. ° °5. Persistent numbness or new problems with movement should be communicated to the surgeon or the [facility] ([phone])/ [facility] ([phone]). ° °

## 2016-10-09 NOTE — Op Note (Signed)
Preop diagnosis: Left breast DCIS Postop diagnosis: Same Procedure performed: Left radioactive seed localized lumpectomy #2 blue dye injection #3 attempted left axillary sentinel lymph node biopsy converted to limited left axillary lymph node dissection Surgeon:Lynette Noah K. Anesthesia: Gen. via LMA Indications: This is a 65 year old female who presented with suspicious findings in the posterior part of her breast in the left upper outer quadrant on recent mammogram. Biopsy showed DCIS. MRI confirm that there was no further areas of disease. Radioactive seed was placed several days ago by radiology. She presents now for lumpectomy and sentinel lymph node biopsy. We decided to perform sentinel lymph node biopsy in case there was any invasive cancer. It would be difficult to come back and do no sentinel lymph node biopsy based on the location of the primary tumor.  The presence of the seed was confirmed with neoprobe in the preoperative area. The patient's left breast was injected with technetium sulfur colloid by the radiology tech.  Description of procedure: The patient brought to the operating room and placed in a supine position on the operating room table. After an adequate level of general anesthesia was obtained, the patient's left breast and axilla were prepped with ChloraPrep and draped sterile fashion. A timeout was taken to ensure the proper patient and proper procedure. We injected methylene blue dye around the nipple under the dermis. This area was massaged for several minutes. We interrogated the breast with the neoprobe using the radioactive seed settings. From an anterior posterior axis the lesion is in the left upper outer quadrant at 2:00. However on the mammogram in the lateral view the seed and biopsy clip were almost at the posterior edge of the breast on the chest wall. Therefore we decided to approach this from the lateral approach in the axilla. I made a 4 cm incision in the  axilla. We dissected down into the breast tissue with cautery. Using the lighted retractors, we dissected medially using the neoprobe for guidance. We raised margins all the way around the radioactive seed. Our posterior margin is the surface of the pectoralis muscle. We dissected medially until until we were past the seed. We then grasped the specimen and began dissecting it off of the underlying muscle. We almost had the entire specimen removed when it appeared that the seed became dislodged. We were quickly able to locate it and sent this separately in its own specimen cup. We completed our excision of the lumpectomy specimen and oriented with a paint kit. Specimen mammogram confirmed that the biopsy clip is within the medial part of the specimen. The specimen was sent for pathologic examination. I cannot palpate any further gross disease within the biopsy cavity. The biopsy cavity was marked with 5 clips. We irrigated thoroughly and inspected for hemostasis. We then interrogated the upper axilla with the neoprobe on the sentinel lymph node settings. There is no activity noted in the axilla. There is no blue dye seen tracking up to the axilla. We dissected up into the axillary contents. I could palpate a few small lymph nodes. We excised a small portion of fatty tissue from the axilla including some palpable lymph nodes. This was sent as left axillary contents. We irrigated the wound thoroughly and inspected for hemostasis. The wound was closed with a deep layer 3-0 Vicryl and a subcuticular layer of 4-0 Monocryl. Steri-Strips and clean dressings were applied. The patient was then extubated and brought to the recovery room in stable condition. All sponge, instrument, and needle counts are  correct.  Christina Hardin. Georgette Dover, MD, [facility]  General/ Trauma Surgery  10/09/2016 2:56 PM

## 2016-10-10 ENCOUNTER — Encounter: Payer: Medicare Other | Admitting: Surgery

## 2016-10-10 ENCOUNTER — Telehealth: Payer: Self-pay | Admitting: *Deleted

## 2016-10-10 ENCOUNTER — Encounter (HOSPITAL_BASED_OUTPATIENT_CLINIC_OR_DEPARTMENT_OTHER): Payer: Self-pay | Admitting: Surgery

## 2016-10-10 NOTE — Telephone Encounter (Signed)
CALLED PATIENT TO INFORM OF [facility]  APPT. FOR 10-29-16- 7:30 AM - NURSE AND [facility] APPT. 8 AM WITH DR. MOODY, PT. IS GOOD WITH APPT. TIME AND DATE

## 2016-10-15 ENCOUNTER — Telehealth: Payer: Self-pay | Admitting: Hematology and Oncology

## 2016-10-15 NOTE — Telephone Encounter (Signed)
Appt rescheduled to a later time w/Gudena. MD she was originally scheduled with has [facility] and needed to be moved per Manuela Schwartz. Pt agreed to the change of the appt time and MD.

## 2016-10-17 ENCOUNTER — Encounter: Payer: Self-pay | Admitting: Family Medicine

## 2016-10-17 ENCOUNTER — Ambulatory Visit (INDEPENDENT_AMBULATORY_CARE_PROVIDER_SITE_OTHER): Payer: Medicare Other | Admitting: Family Medicine

## 2016-10-17 DIAGNOSIS — I712 Thoracic aortic aneurysm, without rupture, unspecified: Secondary | ICD-10-CM

## 2016-10-17 DIAGNOSIS — I5189 Other ill-defined heart diseases: Secondary | ICD-10-CM

## 2016-10-17 DIAGNOSIS — I10 Essential (primary) hypertension: Secondary | ICD-10-CM | POA: Diagnosis not present

## 2016-10-17 DIAGNOSIS — I519 Heart disease, unspecified: Secondary | ICD-10-CM

## 2016-10-17 DIAGNOSIS — I4892 Unspecified atrial flutter: Secondary | ICD-10-CM

## 2016-10-17 DIAGNOSIS — R55 Syncope and collapse: Secondary | ICD-10-CM

## 2016-10-17 NOTE — Assessment & Plan Note (Signed)
>>  ASSESSMENT AND PLAN FOR THORACIC AORTIC ANEURYSM WITHOUT RUPTURE ([facility]) WRITTEN ON 10/17/2016  4:19 PM BY HENSEL, Santiago Bumpers, MD  Needs imaging in Dec for FU.  Defer to Dr. Laneta Simmers.

## 2016-10-17 NOTE — Progress Notes (Signed)
Subjective:     Patient ID: Christina Hardin, female   DOB: September 24, 1951, 65 y.o.   MRN: [id number]  HPI  Review of Systems     Objective:   Physical Exam     Assessment:     Began note in wrong format    Plan:     Began note in wrong format.

## 2016-10-17 NOTE — Assessment & Plan Note (Signed)
Avoid diuretics which make contribute to her near syncope.

## 2016-10-17 NOTE — Assessment & Plan Note (Signed)
Doubt that PAF is causing her presyncope.  Given a flutter, will use beta blocker as her primary antihypertensive agent.

## 2016-10-17 NOTE — Patient Instructions (Addendum)
Stop the hydrochlorothiazide and go ahead and throw them away.  I don't want you going back on them.   OK to stay off the potassium.  Remind me to check your potassium in 2-3 months. In the short run, I am more worried about low blood than high blood pressure. If you need more blood pressure medicine, I will likely go up on the dose of the metoprolol. If the lightheaded spells go away and if your home blood pressure readings remain OK, see me in three months for a potassium check.  If problems, see me sooner. Keep the appointment with Dr. Cyndia Bent.  I will let him order the follow up scan to make sure we get the right one.   Do keep an eye on your appetite.  Again, if it doesn't get back to normal, I may want to do more tests.

## 2016-10-17 NOTE — Assessment & Plan Note (Signed)
Needs imaging in Dec for FU.  Defer to Dr. Cyndia Bent.

## 2016-10-17 NOTE — Progress Notes (Signed)
   Subjective:    Patient ID: Christina Hardin, female    DOB: [birth year], 65 y.o.   MRN: [id number]  HPI   Christina Hardin is once again having the lightheaded/near syncope spells she did back in May/June of this year.  She had a thorough workup at that time and was found to have paroxysmal atrial flutter and diastolic dysfunction.  Reviewed cardiology notes which indicated I should avoid diuretics in treating her hypertension.  She had inadvertantly been restarted on her HCTZ in the July Aug timeframe due to worsening hypertension.    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  Lightheaded spells have been since that biopsy.  She does not have the sense of palpitations.  Denies CP and SOB.  Did check BP at home and hit a low of 108/54.    She also needs FU imaging of thoracic aortic aneurism  In Dec.  She has an appointment with Dr. Cyndia Bent CVTS next week.  I will let him order the imaging so that we get the correct one.    Slight decrease appetite since surg.     Review of Systems     Objective:   Physical Exam VS noted  3 LB weight loss noted. Pulse by me is 70 and regular Lungs clear Cardiac RRR without m or g Ext no edema.        Assessment & Plan:

## 2016-10-17 NOTE — Assessment & Plan Note (Signed)
DC HCTZ and label as intollerant so that I do not inadvertantly restart again.  I am more concerned about low rather than high blood pressure in the short run.  Increase metoprolol as necessary for BP RX.

## 2016-10-17 NOTE — Assessment & Plan Note (Signed)
>>  ASSESSMENT AND PLAN FOR PAROXYSMAL ATRIAL FLUTTER ([facility]) WRITTEN ON 10/17/2016  4:20 PM BY HENSEL, Santiago Bumpers, MD  Doubt that PAF is causing her presyncope.  Given a flutter, will use beta blocker as her primary antihypertensive agent.

## 2016-10-17 NOTE — Assessment & Plan Note (Signed)
Over treatment of hypertension and/or poor diastolic filling with diuretic.  Stop diuretic and see if symptoms resolve.  Monitor home BP

## 2016-10-19 ENCOUNTER — Encounter: Payer: Self-pay | Admitting: *Deleted

## 2016-10-19 ENCOUNTER — Encounter: Payer: Self-pay | Admitting: Hematology and Oncology

## 2016-10-19 ENCOUNTER — Ambulatory Visit (HOSPITAL_BASED_OUTPATIENT_CLINIC_OR_DEPARTMENT_OTHER): Payer: Medicare Other | Admitting: Hematology and Oncology

## 2016-10-19 ENCOUNTER — Ambulatory Visit: Payer: Medicare Other | Admitting: Hematology

## 2016-10-19 DIAGNOSIS — D0512 Intraductal carcinoma in situ of left breast: Secondary | ICD-10-CM | POA: Diagnosis not present

## 2016-10-19 DIAGNOSIS — Z803 Family history of malignant neoplasm of breast: Secondary | ICD-10-CM | POA: Diagnosis not present

## 2016-10-19 NOTE — Progress Notes (Signed)
[place] Psychosocial Distress Screening Clinical Social Work  Clinical Social Work was referred by distress screening protocol.  The patient scored a 6 on the Psychosocial Distress Thermometer which indicates moderate distress. Clinical Social Worker phoned pt to assess for distress and other psychosocial needs. Pt reports to have cardiac concerns and 65 yo granddaughter that lives with her. Both of these issues increase her anxiety. CSW educated pt on common anxiety/emotions in response to cancer Dx and treatment. Pt reports she has developed good coping techniques and feels "OK" currently. CSW reviewed Support Groups/Programs to assist to decrease anxiety. Pt open to consider and will reach out as needed.   ONCBCN DISTRESS SCREENING 10/08/2016  Screening Type Initial Screening  Distress experienced in past week (1-10) 6  Family Problem type Children;Other (comment)  Physician notified of physical symptoms Yes  Referral to clinical social work Yes    Clinical Social Worker follow up needed: No.  If yes, follow up plan:  Loren Racer, [facility] Phone: [phone] Fax: [phone]

## 2016-10-19 NOTE — Progress Notes (Signed)
[facility] CONSULT NOTE  Patient Care Team: Zenia Resides, MD as PCP - General  CHIEF COMPLAINTS/PURPOSE OF CONSULTATION:  Newly diagnosed breast cancer  HISTORY OF PRESENTING ILLNESS:  Christina Hardin 65 y.o. female is here because of recent diagnosis of left breast DCIS. Patient had a routine screening mammogram that revealed an abnormality in the left breast this led to initial evaluations with ultrasound and a biopsy. She underwent lumpectomy with Dr. Marlou Starks on 10/09/2016. Pathology revealed DCIS grade 2 with necrosis and calcifications that was ER/PR positive. She is here today to discuss adjuvant treatment options. She is recovered very well from the surgery she does require occasional pain medication.   I reviewed her records extensively and collaborated the history with the patient.  SUMMARY OF ONCOLOGIC HISTORY:   DCIS (ductal carcinoma in situ) of breast   10/09/2016 Surgery    Left lumpectomy: DCIS with calcifications and focal necrosis, grade 2, margins negative, 0/1 lymph node negative, ER 100%, PR 95%, Tis N0 stage 0       In terms of breast cancer risk profile:  She menarched at early age of 89 and had hysterectomy She had 3 pregnancy, her first child was born at age 47  She never received birth control pills  She was exposed to hormone replacement therapy for 6 months.  She has significant family history of Breast cancer  MEDICAL HISTORY:  Past Medical History:  Diagnosis Date  . A-fib ([place])   . Anemia   . Anxiety   . Breast cancer ([place]) 09/05/2016   left breast dcis  . Chronic kidney disease    hx kidney stone  . Ductal carcinoma in situ (DCIS) of left breast   . Gastric bypass status for obesity   . GERD (gastroesophageal reflux disease)   . Hypertension   . Rheumatoid arthritis ([place])   . Thoracic aortic aneurysm ([place])   . Thoracic aortic aneurysm [facility])     SURGICAL HISTORY: Past Surgical History:  Procedure Laterality Date  .  ABDOMINAL HYSTERECTOMY    . BREAST BIOPSY Left   . BREAST LUMPECTOMY WITH RADIOACTIVE SEED AND SENTINEL LYMPH NODE BIOPSY Left 10/09/2016   Procedure: BREAST LUMPECTOMY WITH RADIOACTIVE SEED AND SENTINEL LYMPH NODE BIOPSY;  Surgeon: Donnie Mesa, MD;  Location: [facility];  Service: General;  Laterality: Left;  . COLONOSCOPY W/ POLYPECTOMY    . GASTRIC BYPASS    . HYSTERECTOMY ABDOMINAL WITH SALPINGECTOMY      SOCIAL HISTORY: Social History   Social History  . Marital status: Married    Spouse name: N/A  . Number of children: N/A  . Years of education: N/A   Occupational History  . Not on file.   Social History Main Topics  . Smoking status: Never Smoker  . Smokeless tobacco: Never Used  . Alcohol use No  . Drug use: No  . Sexual activity: Not on file   Other Topics Concern  . Not on file   Social History Narrative   Epworth Sleepiness Scale Score: 10     FAMILY HISTORY: Family History  Problem Relation Age of Onset  . Atrial fibrillation Mother   . Dementia Mother   . Hypertension Mother   . Gout Mother   . Arthritis Mother   . Stroke Mother   . COPD Father   . Emphysema Father   . Lung cancer Father   . Melanoma Father   . Hypertension Sister   . Sleep apnea  Sister   . Healthy Brother   . Healthy Brother   . Heart attack Maternal Aunt   . Cancer Maternal Aunt     Breast Cancer  . Heart attack Maternal Uncle   . Heart attack Paternal Uncle   . Heart attack Maternal Grandfather   . Cancer Cousin     Breast Cancer  . Cancer Cousin     Breast Cancer  . Cancer Cousin     Breast, Ovarian, Uterine Cancer    ALLERGIES:  is allergic to dilaudid [hydromorphone hcl]; hydrochlorothiazide; and propoxyphene hcl.  MEDICATIONS:  Current Outpatient Prescriptions  Medication Sig Dispense Refill  . Acetaminophen (TYLENOL ARTHRITIS PAIN PO) Take by mouth.    Marland Kitchen apixaban (ELIQUIS) 5 MG TABS tablet Take 1 tablet (5 mg total) by mouth 2 (two) times  daily. 60 tablet 5  . Calcium-Magnesium-Vitamin D (CALCIUM 500 PO) Take by mouth daily.    . Cyanocobalamin (VITAMIN B-12) 2500 MCG SUBL Take 1 tablet by mouth daily. Reported on 06/08/2016    . famotidine (PEPCID) 40 MG tablet Take 1 tablet (40 mg total) by mouth daily. 90 tablet 3  . iron polysaccharides (NIFEREX) 150 MG capsule Take 1 capsule (150 mg total) by mouth daily. 90 capsule 1  . metoprolol succinate (TOPROL-XL) 25 MG 24 hr tablet Take 1 tablet (25 mg total) by mouth daily. 90 tablet 3  . metroNIDAZOLE (METROCREAM) 0.75 % cream Apply 1 application topically 2 (two) times daily as needed (rosacea).    . Multiple Vitamin (MULTIVITAMIN) capsule Take 1 capsule by mouth daily. Reported on 06/08/2016    . traMADol (ULTRAM) 50 MG tablet Take 1-2 tablets (50-100 mg total) by mouth every 6 (six) hours as needed for moderate pain or severe pain. 30 tablet 0  . diphenhydramine-acetaminophen (TYLENOL PM) 25-500 MG TABS tablet Take 1 tablet by mouth.     No current facility-administered medications for this visit.     REVIEW OF SYSTEMS:   Constitutional: Denies fevers, chills or abnormal night sweats Eyes: Denies blurriness of vision, double vision or watery eyes Ears, nose, mouth, throat, and face: Denies mucositis or sore throat Respiratory: Denies cough, dyspnea or wheezes Cardiovascular: Denies palpitation, chest discomfort or lower extremity swelling Gastrointestinal:  Denies nausea, heartburn or change in bowel habits Skin: Denies abnormal skin rashes Lymphatics: Denies new lymphadenopathy or easy bruising Neurological:Denies numbness, tingling or new weaknesses Behavioral/Psych: Mood is stable, no new changes  Breast:  Denies any palpable lumps or discharge All other systems were reviewed with the patient and are negative.  PHYSICAL EXAMINATION: ECOG PERFORMANCE STATUS: 1 - Symptomatic but completely ambulatory  Vitals:   10/19/16 1531  BP: 125/75  Pulse: 64  Resp: 18  Temp:  97.6 F (36.4 C)   Filed Weights   10/19/16 1531  Weight: 189 lb 8 oz (86 kg)    GENERAL:alert, no distress and comfortable SKIN: skin color, texture, turgor are normal, no rashes or significant lesions EYES: normal, conjunctiva are pink and non-injected, sclera clear OROPHARYNX:no exudate, no erythema and lips, buccal mucosa, and tongue normal  NECK: supple, thyroid normal size, non-tender, without nodularity LYMPH:  no palpable lymphadenopathy in the cervical, axillary or inguinal LUNGS: clear to auscultation and percussion with normal breathing effort HEART: regular rate & rhythm and no murmurs and no lower extremity edema ABDOMEN:abdomen soft, non-tender and normal bowel sounds Musculoskeletal:no cyanosis of digits and no clubbing  PSYCH: alert & oriented x 3 with fluent speech NEURO: no focal motor/sensory deficits  LABORATORY DATA:  I have reviewed the data as listed Lab Results  Component Value Date   WBC 8.8 08/09/2016   HGB 11.1 (L) 10/09/2016   HCT 36.6 08/09/2016   MCV 81.0 08/09/2016   PLT 249 08/09/2016   Lab Results  Component Value Date   NA 140 10/02/2016   K 4.0 10/02/2016   CL 104 10/02/2016   CO2 27 10/02/2016    RADIOGRAPHIC STUDIES: I have personally reviewed the radiological reports and agreed with the findings in the report.  ASSESSMENT AND PLAN:  DCIS (ductal carcinoma in situ) of breast 10/09/2016: Left lumpectomy: DCIS with calcifications and focal necrosis, grade 2, margins negative, 0/1 lymph node negative, ER 100%, PR 95%, Tis N0 stage 0 Left breast MRI 09/17/2016: 2.6 x 4.2 x 1.4 cm area of enhancement at left breast 2:00 position  Pathology review: I discussed with the patient the difference between DCIS and invasive breast cancer. It is considered a precancerous lesion. DCIS is classified as a 0. It is generally detected through mammograms as calcifications. We discussed the significance of grades and its impact on prognosis. We also  discussed the importance of ER and PR receptors and their implications to adjuvant treatment options. Prognosis of DCIS dependence on grade, comedo necrosis. It is anticipated that if not treated, 20-30% of DCIS can develop into invasive breast cancer.  Recommendation: 1. Adjuvant radiation therapy 3. Followed by antiestrogen therapy with tamoxifen 5 years  Tamoxifen counseling: We discussed the risks and benefits of tamoxifen. These include but not limited to insomnia, hot flashes, mood changes, vaginal dryness. Although rare, serious side effects including risk of blood clots were also discussed. We strongly believe that the benefits far outweigh the risks. Patient understands these risks and consented to starting treatment. Planned treatment duration is 5 years.  I reviewed the [facility] nomogram for DCIS. Agents risk of recurrence after radiation and hormonal therapy is to percent or 5 years and 3% over 10 years After radiation therapy, without hormone therapy risk of recurrence 4% at 5 years and 7% over 10 years No further therapy the risk of recurrence is 12% at 5 years 18% over 10 years  Return to clinic after radiation to discuss antiestrogen treatment plan.  All questions were answered. The patient knows to call the clinic with any problems, questions or concerns.    Rulon Eisenmenger, MD 10/19/16

## 2016-10-19 NOTE — Assessment & Plan Note (Signed)
10/09/2016: Left lumpectomy: DCIS with calcifications and focal necrosis, grade 2, margins negative, 0/1 lymph node negative, ER 100%, PR 95%, Tis N0 stage 0 Left breast MRI 09/17/2016: 2.6 x 4.2 x 1.4 cm area of enhancement at left breast 2:00 position

## 2016-10-21 NOTE — Progress Notes (Signed)
Follow Up New Consult  Left Breast Cancer  \Diagnosis 10/09/2016: Dr. Rodman Key Tsuei,MD 1. Breast, lumpectomy, Left DUCTAL CARCINOMA IN SITU WITH CALCIFICATION AND FOCAL NECROSIS, GRADE 2 ALL MARGINS OF RESECTION ARE NEGATIVE FOR CARCINOMA 2. Lymph node, biopsy, Left Axillary Contents ONE BENIGN LYMPH NODE (0/1)  10/19/16: Dr. Nicholas Lose note Recommendation: 1. Adjuvant radiation therapy 3. Followed by antiestrogen therapy with tamoxifen 5 years  Return  Clinic after radiation to discuss antiestrogen treatment plan

## 2016-10-22 ENCOUNTER — Ambulatory Visit (HOSPITAL_BASED_OUTPATIENT_CLINIC_OR_DEPARTMENT_OTHER): Payer: Medicare Other | Admitting: Genetic Counselor

## 2016-10-22 ENCOUNTER — Encounter: Payer: Self-pay | Admitting: Genetic Counselor

## 2016-10-22 ENCOUNTER — Other Ambulatory Visit: Payer: Medicare Other

## 2016-10-22 DIAGNOSIS — Z8041 Family history of malignant neoplasm of ovary: Secondary | ICD-10-CM

## 2016-10-22 DIAGNOSIS — Z315 Encounter for genetic counseling: Secondary | ICD-10-CM

## 2016-10-22 DIAGNOSIS — D0512 Intraductal carcinoma in situ of left breast: Secondary | ICD-10-CM | POA: Diagnosis not present

## 2016-10-22 DIAGNOSIS — Z808 Family history of malignant neoplasm of other organs or systems: Secondary | ICD-10-CM

## 2016-10-22 DIAGNOSIS — Z806 Family history of leukemia: Secondary | ICD-10-CM

## 2016-10-22 DIAGNOSIS — Z803 Family history of malignant neoplasm of breast: Secondary | ICD-10-CM | POA: Insufficient documentation

## 2016-10-22 NOTE — Progress Notes (Signed)
REFERRING PROVIDER: Zenia Resides, MD [place], [place] [address]   Nicholas Lose, MD  PRIMARY PROVIDER:  Zigmund Gottron, MD  PRIMARY REASON FOR VISIT:  1. Ductal carcinoma in situ (DCIS) of left breast   2. Family history of breast cancer      HISTORY OF PRESENT ILLNESS:   Christina Hardin, a 65 y.o. female, was seen for a [facility] cancer genetics consultation at the request of Dr. Lindi Adie due to a personal and family history of cancer.  Christina Hardin presents to clinic today to discuss the possibility of a hereditary predisposition to cancer, genetic testing, and to further clarify her future cancer risks, as well as potential cancer risks for family members.   In 2017, at the age of 14, Christina Hardin was diagnosed with DCIS of the left breast. This will be treated with lumpectomy and radiation.     CANCER HISTORY:    DCIS (ductal carcinoma in situ) of breast   10/09/2016 Surgery    Left lumpectomy: DCIS with calcifications and focal necrosis, grade 2, margins negative, 0/1 lymph node negative, ER 100%, PR 95%, Tis N0 stage 0        HORMONAL RISK FACTORS:  Menarche was at age 15.  First live birth at age 79.  OCP use for approximately 0 years.  Ovaries intact: no.  Hysterectomy: yes.  Menopausal status: postmenopausal.  HRT use: 0 years. Colonoscopy: yes; normal. Mammogram within the last year: yes. Number of breast biopsies: 1. Up to date with pelvic exams:  yes. Any excessive radiation exposure in the past:  no  Past Medical History:  Diagnosis Date  . A-fib ([place])   . Anemia   . Anxiety   . Breast cancer ([place]) 09/05/2016   left breast dcis  . Chronic kidney disease    hx kidney stone  . Ductal carcinoma in situ (DCIS) of left breast   . Family history of breast cancer   . Gastric bypass status for obesity   . GERD (gastroesophageal reflux disease)   . Hypertension   . Rheumatoid arthritis ([place])   . Thoracic aortic aneurysm ([place])   .  Thoracic aortic aneurysm [facility])     Past Surgical History:  Procedure Laterality Date  . ABDOMINAL HYSTERECTOMY    . BREAST BIOPSY Left   . BREAST LUMPECTOMY WITH RADIOACTIVE SEED AND SENTINEL LYMPH NODE BIOPSY Left 10/09/2016   Procedure: BREAST LUMPECTOMY WITH RADIOACTIVE SEED AND SENTINEL LYMPH NODE BIOPSY;  Surgeon: Donnie Mesa, MD;  Location: [facility];  Service: General;  Laterality: Left;  . COLONOSCOPY W/ POLYPECTOMY    . GASTRIC BYPASS    . HYSTERECTOMY ABDOMINAL WITH SALPINGECTOMY      Social History   Social History  . Marital status: Married    Spouse name: N/A  . Number of children: 3  . Years of education: N/A   Social History Main Topics  . Smoking status: Never Smoker  . Smokeless tobacco: Never Used  . Alcohol use No  . Drug use: No  . Sexual activity: Not Asked   Other Topics Concern  . None   Social History Narrative   Epworth Sleepiness Scale Score: 10      FAMILY HISTORY:  We obtained a detailed, 4-generation family history.  Significant diagnoses are listed below: Family History  Problem Relation Age of Onset  . Atrial fibrillation Mother   . Dementia Mother   . Hypertension Mother   . Gout Mother   .  Arthritis Mother   . Stroke Mother   . COPD Father   . Emphysema Father   . Lung cancer Father   . Melanoma Father   . Hypertension Sister   . Sleep apnea Sister   . Healthy Brother   . Healthy Brother   . Heart attack Maternal Aunt   . Breast cancer Maternal Aunt     dx over 20  . Heart attack Maternal Uncle   . Prostate cancer Maternal Uncle     unsure if this was truly prostate cancer  . Heart attack Paternal Uncle   . Heart attack Maternal Grandfather   . Breast cancer Cousin     mat first cousin  . Breast cancer Cousin     mat first cousin  . Cancer Cousin     father's maternal first cousin with Breast, Ovarian, Uterine Cancer  . Leukemia Other     father's maternal uncle  . Leukemia Other     father's  maternal uncle  . Leukemia Cousin     3 of father's maternal first cousin    The patient has three boys who are healthy and cancer free.  She hs one sister and two brothers who are cancer free.  The patient's mother is alive at 22 and her father died of lung cancer at 77.  Her mother had seven sisters and two brothers.  One sister had breast cancer and a brother may have had prostate cancer.  The patient has two maternal cousins with breast cancer.    The patient's father had five brothers and a sister.  One brother had lung cancer.  His parents died of non cancer related issues.  His mother had two brothers with leukemia and a sister with lung cancer.  The sister had three children with leukemia as adults and a daughter who had breast and ovarian or uterine cancer.  Patient's maternal ancestors are of [place] and [place] descent, and paternal ancestors are of [place] descent. There is no reported Ashkenazi Jewish ancestry. There is no known consanguinity.  GENETIC COUNSELING ASSESSMENT: YENTL VERGE is a 65 y.o. female with a personal and family history of breast cancer which is somewhat suggestive of a hereditary cancer syndrome and predisposition to cancer. We, therefore, discussed and recommended the following at today's visit.   DISCUSSION: We discussed that 5-10% of breast cancer is due to hereditary causes, most commonly due to BRCA mutations.  Other genes can also be associated with hereditary cancer syndromes, including ATM, CHEK2 and PALB2.  We discussed that, while she meets criteria for genetic testing, all of her family members, as well as herself, who have been diagnosed with breast cancer have all been over 31.  Additionally, her mother had six sisters who never had breast cancer and she has multiple female cousins who never had breast cancer.  Therefore, her cancer more likely falls into a familial category rather than a hereditary category and the chances of testing positive would be  lower.  We reviewed the characteristics, features and inheritance patterns of hereditary cancer syndromes. We also discussed genetic testing, including the appropriate family members to test, the process of testing, insurance coverage and turn-around-time for results. We discussed the implications of a negative, positive and/or variant of uncertain significant result. We recommended Ms. Staley pursue genetic testing for the Breast/Ovarian cancer gene panel. The Breast/Ovarian gene panel offered by [facility] includes sequencing and rearrangement analysis for the following 20 genes:  ATM, BARD1, BRCA1, BRCA2, BRIP1,  CDH1, CHEK2, EPCAM, FANCC, MLH1, MSH2, MSH6, NBN, PALB2, PMS2, PTEN, RAD51C, RAD51D, TP53, and XRCC2.    Based on Ms. Staley's personal and family history of cancer, she meets medical criteria for genetic testing. Despite that she meets criteria, she may still have an out of pocket cost. We discussed that if her out of pocket cost for testing is over $100, the laboratory will call and confirm whether she wants to proceed with testing.  If the out of pocket cost of testing is less than $100 she will be billed by the genetic testing laboratory.   PLAN: Despite our recommendation, Christina Hardin did not wish to pursue genetic testing at today's visit. We understand this decision, and remain available to coordinate genetic testing at any time in the future. We; therefore, recommend Ms. Staley continue to follow the cancer screening guidelines given by her primary healthcare provider.  Lastly, we encouraged Ms. Staley to remain in contact with cancer genetics annually so that we can continuously update the family history and inform her of any changes in cancer genetics and testing that may be of benefit for this family.   Ms.  Lincoln Maxin questions were answered to her satisfaction today. Our contact information was provided should additional questions or concerns arise. Thank you for the referral and allowing [place]  to share in the care of your patient.   Karen P. Florene Glen, [place], [facility] Certified Genetic Counselor [email or web address] phone: [phone]  The patient was seen for a total of 45 minutes in face-to-face genetic counseling.  This patient was discussed with Drs. Magrinat, Lindi Adie and/or Burr Medico who agrees with the above.    _______________________________________________________________________ For Office Staff:  Number of people involved in session: 1 Was an Intern/ student involved with case: no

## 2016-10-24 ENCOUNTER — Institutional Professional Consult (permissible substitution) (INDEPENDENT_AMBULATORY_CARE_PROVIDER_SITE_OTHER): Payer: Medicare Other | Admitting: Surgery

## 2016-10-24 ENCOUNTER — Encounter: Payer: Self-pay | Admitting: Surgery

## 2016-10-24 VITALS — BP 140/90 | HR 60 | Resp 20 | Ht 65.0 in | Wt 189.0 lb

## 2016-10-24 DIAGNOSIS — I712 Thoracic aortic aneurysm, without rupture: Secondary | ICD-10-CM

## 2016-10-24 DIAGNOSIS — I7121 Aneurysm of the ascending aorta, without rupture: Secondary | ICD-10-CM

## 2016-10-24 NOTE — Progress Notes (Signed)
Cardiothoracic Surgery Consultation  PCP is Zigmund Gottron, MD Referring Provider is Almyra Deforest, [place]  Chief Complaint  Patient presents with  . Thoracic Aortic Aneurysm    Surgical eval, Chest CT 05/24/16, ECHO 06/22/16    HPI:  The patient is a 65 year old woman with hypertension who was flying to [place] last June and developed some tachycardia, shortness of breath and dizziness and felt like she might pass out. They laid her down and a physician on the flight treated her with oxygen and elevating her feet with improvement. Since that time had a couple of other episodes of similar symptoms. When she returned she had CTA performed to rule out PE since she was on an airplane and this was negative but did show a 4.5 cm fusiform ascending aortic aneurysm. She was subsequently diagnosed with atrial flutter and she was started on Eliquis and Toprol XL. She had an echo on 7/21 showing normal LV systolic function with grade 2 diastolic dysfunction and normal valvular function. A nuclear stress test was low risk. She also was diagnosed with breast DCIS and underwent a left breast lumpectomy on 10/09/2016. She is going to have XRT and then Tamoxifen. There is no family history of aneurysm or aortic dissection but a lot of heart disease in family.  Past Medical History:  Diagnosis Date  . A-fib ([place])   . Anemia   . Anxiety   . Breast cancer ([place]) 09/05/2016   left breast dcis  . Chronic kidney disease    hx kidney stone  . Ductal carcinoma in situ (DCIS) of left breast   . Family history of breast cancer   . Gastric bypass status for obesity   . GERD (gastroesophageal reflux disease)   . Hypertension   . Rheumatoid arthritis ([place])   . Thoracic aortic aneurysm ([place])   . Thoracic aortic aneurysm [facility])     Past Surgical History:  Procedure Laterality Date  . ABDOMINAL HYSTERECTOMY    . BREAST BIOPSY Left   . BREAST LUMPECTOMY WITH RADIOACTIVE SEED AND SENTINEL LYMPH NODE BIOPSY Left  10/09/2016   Procedure: BREAST LUMPECTOMY WITH RADIOACTIVE SEED AND SENTINEL LYMPH NODE BIOPSY;  Surgeon: Donnie Mesa, MD;  Location: [facility];  Service: General;  Laterality: Left;  . COLONOSCOPY W/ POLYPECTOMY    . GASTRIC BYPASS    . HYSTERECTOMY ABDOMINAL WITH SALPINGECTOMY      Family History  Problem Relation Age of Onset  . Atrial fibrillation Mother   . Dementia Mother   . Hypertension Mother   . Gout Mother   . Arthritis Mother   . Stroke Mother   . COPD Father   . Emphysema Father   . Lung cancer Father   . Melanoma Father   . Hypertension Sister   . Sleep apnea Sister   . Healthy Brother   . Healthy Brother   . Heart attack Maternal Aunt   . Breast cancer Maternal Aunt     dx over 49  . Heart attack Maternal Uncle   . Prostate cancer Maternal Uncle     unsure if this was truly prostate cancer  . Heart attack Paternal Uncle   . Heart attack Maternal Grandfather   . Breast cancer Cousin     mat first cousin  . Breast cancer Cousin     mat first cousin  . Cancer Cousin     father's maternal first cousin with Breast, Ovarian, Uterine Cancer  . Leukemia Other  father's maternal uncle  . Leukemia Other     father's maternal uncle  . Leukemia Cousin     3 of father's maternal first cousin    Social History Social History  Substance Use Topics  . Smoking status: Never Smoker  . Smokeless tobacco: Never Used  . Alcohol use No    Current Outpatient Prescriptions  Medication Sig Dispense Refill  . Acetaminophen (TYLENOL ARTHRITIS PAIN PO) Take by mouth.    Marland Kitchen apixaban (ELIQUIS) 5 MG TABS tablet Take 1 tablet (5 mg total) by mouth 2 (two) times daily. 60 tablet 5  . Calcium-Magnesium-Vitamin D (CALCIUM 500 PO) Take by mouth daily.    . Cyanocobalamin (VITAMIN B-12) 2500 MCG SUBL Take 1 tablet by mouth daily. Reported on 06/08/2016    . diphenhydramine-acetaminophen (TYLENOL PM) 25-500 MG TABS tablet Take 1 tablet by mouth.    . famotidine  (PEPCID) 40 MG tablet Take 1 tablet (40 mg total) by mouth daily. 90 tablet 3  . iron polysaccharides (NIFEREX) 150 MG capsule Take 1 capsule (150 mg total) by mouth daily. 90 capsule 1  . metoprolol succinate (TOPROL-XL) 25 MG 24 hr tablet Take 1 tablet (25 mg total) by mouth daily. 90 tablet 3  . metroNIDAZOLE (METROCREAM) 0.75 % cream Apply 1 application topically 2 (two) times daily as needed (rosacea).    . Multiple Vitamin (MULTIVITAMIN) capsule Take 1 capsule by mouth daily. Reported on 06/08/2016    . traMADol (ULTRAM) 50 MG tablet Take 1-2 tablets (50-100 mg total) by mouth every 6 (six) hours as needed for moderate pain or severe pain. 30 tablet 0   No current facility-administered medications for this visit.     Allergies  Allergen Reactions  . Dilaudid [Hydromorphone Hcl] Shortness Of Breath  . Hydrochlorothiazide     May make cardiac output less with atrial flutter.  . Propoxyphene Hcl     REACTION: unspecified    Review of Systems  Constitutional: Negative.   HENT: Negative.   Eyes: Negative.   Respiratory: Negative.   Cardiovascular: Positive for palpitations. Negative for chest pain and leg swelling.       Shortness of breath with exertion  Gastrointestinal: Negative.   Endocrine: Negative.   Genitourinary: Negative.   Musculoskeletal: Positive for arthralgias.  Allergic/Immunologic: Negative.   Neurological: Positive for dizziness.  Hematological: Negative.   Psychiatric/Behavioral: Negative.     BP 140/90   Pulse 60   Resp 20   Ht 5\' 5"  (1.651 m)   Wt 189 lb (85.7 kg)   SpO2 97% Comment: RA  BMI 31.45 kg/m  Physical Exam  Constitutional: She is oriented to person, place, and time. She appears well-developed and well-nourished. No distress.  HENT:  Head: Normocephalic and atraumatic.  Mouth/Throat: Oropharynx is clear and moist.  Eyes: EOM are normal. Pupils are equal, round, and reactive to light.  Neck: Normal range of motion. Neck supple. No JVD  present. No thyromegaly present.  Cardiovascular: Normal rate, regular rhythm and normal heart sounds.   No murmur heard. Pulmonary/Chest: Effort normal and breath sounds normal. No respiratory distress.  Abdominal: Soft. Bowel sounds are normal. There is no tenderness.  Musculoskeletal: Normal range of motion. She exhibits no edema.  Lymphadenopathy:    She has no cervical adenopathy.  Neurological: She is alert and oriented to person, place, and time. She has normal strength. No sensory deficit.  Skin: Skin is warm and dry.  Psychiatric: She has a normal mood and affect.  Diagnostic Tests:  CLINICAL DATA:  Multiple episodes of near syncope. Sweating with mild shortness of breath. Elevated D-dimer.  EXAM: CT ANGIOGRAPHY CHEST WITH CONTRAST  TECHNIQUE: Multidetector CT imaging of the chest was performed using the standard protocol during bolus administration of intravenous contrast. Multiplanar CT image reconstructions and MIPs were obtained to evaluate the vascular anatomy.  CONTRAST:  100 mL Isovue 370  COMPARISON:  None.  FINDINGS: Cardiovascular: Negative for pulmonary embolism. The mid ascending thoracic aorta is enlarged measuring up to 4.5 cm. The aortic root at the sinuses measures roughly 3.6 cm. No gross aortic dissection but the aorta is not well opacified on this study. Proximal descending thoracic aorta measures 3.2 cm. Heart size is normal. No significant pericardial fluid.  Mediastinum: No mediastinal, hilar or axillary lymphadenopathy. Limited evaluation of the neck.  Lungs/Pleura: The trachea and mainstem bronchi are patent. 1 or 2 adjacent punctate nodules along the pleural surface in right middle lobe measures 3 mm on sequence 7, image 82. Lungs are clear without consolidation or airspace disease.  Upper Abdomen: Proximal abdominal aorta is patent. The celiac trunk and main branch vessels are patent. There is a small hiatal hernia and  evidence of prior gastric surgery. This appears to represent a gastric bypass procedure. No acute abnormality in the upper abdomen.  Musculoskeletal: No acute abnormality.  Review of the MIP images confirms the above findings.  IMPRESSION: Negative for pulmonary embolism.  No acute abnormality in the chest.  Aneurysm of the ascending thoracic aorta measuring up to 4.5 cm. Recommend semi-annual imaging followup by CTA or MRA and referral to cardiothoracic surgery if not already obtained. This recommendation follows 2010 ACCF/AHA/AATS/ACR/ASA/SCA/SCAI/SIR/STS/SVM Guidelines for the Diagnosis and Management of Patients With Thoracic Aortic Disease. Circulation. 2010; 121: e266-e369  3 mm nodule in the right middle lobe. No follow-up needed if patient is low-risk (and has no known or suspected primary neoplasm). Non-contrast chest CT can be considered in 12 months if patient is high-risk. This recommendation follows the consensus statement: Guidelines for Management of Incidental Pulmonary Nodules Detected on CT Images:From the Fleischner Society 2017; published online before print (10.1148/radiol.SG:5268862).   Electronically Signed   By: Markus Daft M.D.   On: 05/24/2016 17:20  Impression:  She has an incidental 4.5 cm fusiform ascending aortic aneurysm found on CTA in June. This does not require any treatment at this time but should be followed with CT. Ascending aneurysms of this size can be followed on a yearly basis with CT scan. The threshold for recommending surgical replacement of the aorta is at 5.5 cm. I reviewed the CT images with her and answered her questions. I stressed the importance of good blood pressure control. She is on a beta blocker which is ideal. There was also a 3 mm subpleural nodule in the RML which can be followed up on the next scan.  Plan:  I will see her back in June 2018 with a CTA of the chest.  I spent 30 minutes performing this  consultation and > 50% of this time was spent face to face counseling and coordinating the care of this patient's ascending aortic aneurysm.   Gaye Pollack, MD [facility] [phone]

## 2016-10-29 ENCOUNTER — Inpatient Hospital Stay: Admission: RE | Admit: 2016-10-29 | Payer: Self-pay | Source: Ambulatory Visit

## 2016-10-29 ENCOUNTER — Inpatient Hospital Stay
Admission: RE | Admit: 2016-10-29 | Discharge: 2016-10-29 | Disposition: A | Discharge status: Home / Self Care | Payer: Self-pay | Source: Ambulatory Visit | Attending: Radiation Oncology | Admitting: Radiation Oncology

## 2016-10-29 ENCOUNTER — Telehealth: Payer: Self-pay | Admitting: *Deleted

## 2016-10-29 NOTE — Telephone Encounter (Signed)
called patient at home, she stated she has been throwing up and having diarrhea, she cancelled this am with operator, she will call top reschedule ,  8:10 AM

## 2016-11-08 ENCOUNTER — Ambulatory Visit
Admission: RE | Admit: 2016-11-08 | Discharge: 2016-11-08 | Disposition: A | Discharge status: Home / Self Care | Payer: Medicare Other | Source: Ambulatory Visit | Attending: Radiation Oncology | Admitting: Radiation Oncology

## 2016-11-08 ENCOUNTER — Encounter: Payer: Self-pay | Admitting: Radiation Oncology

## 2016-11-08 VITALS — BP 170/88 | HR 53 | Temp 98.1°F | Resp 20 | Wt 190.0 lb

## 2016-11-08 DIAGNOSIS — C50412 Malignant neoplasm of upper-outer quadrant of left female breast: Secondary | ICD-10-CM

## 2016-11-08 DIAGNOSIS — Z510 Encounter for antineoplastic radiation therapy: Secondary | ICD-10-CM | POA: Diagnosis not present

## 2016-11-08 DIAGNOSIS — D0512 Intraductal carcinoma in situ of left breast: Secondary | ICD-10-CM

## 2016-11-08 DIAGNOSIS — Z170 Estrogen receptor positive status [ER+]: Principal | ICD-10-CM

## 2016-11-08 NOTE — Progress Notes (Signed)
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  The patient was originally seen on 10/08/16 and was felt to be a good candidate for breast conservation treatment.  The patient underwent a left lumpectomy and lymph node biopsy on 10/09/16. This revealed grade 2 DCIS with calcification and focal necrosis. The resection margins were negative for carcinoma. Biopsy of a left axillay non-sentinel lymph node was negative for carcinoma. (ER 100%, PR 95%)  The patient consulted with Dr. Lindi Adie on 10/19/16 who discussed adjuvant radiation therapy followed by antiestrogen therapy with Tamoxifen for 5 years.  On 11/02/16, the patient had a post-operative visit with Dr. Georgette Dover. The patient developed a seroma in the lumpectomy site and Dr. Georgette Dover attempted to aspirate some of the seroma, but he could not reach the seroma cavity. The incision did not appear to have any sign of infection, but the skin overlying the biopsy site was slightly pink. The left breast was also seemed fuller than the other side. He decided to empirically treat with Keflex 250 mg 1 capsule QID for 7 days and she would follow up with him next week. The left axillary incision was well-healed with no sign of infection or seroma.  On review of systems; the patient reports her left breast is sore and still somewhat swollen.  ALLERGIES:  is allergic to dilaudid [hydromorphone hcl]; hydrochlorothiazide; and propoxyphene hcl.  Meds: Current Outpatient Prescriptions  Medication Sig Dispense Refill  . Acetaminophen (TYLENOL ARTHRITIS PAIN PO) Take by mouth.    Marland Kitchen apixaban (ELIQUIS) 5 MG TABS tablet Take 1 tablet (5 mg total) by mouth  2 (two) times daily. 60 tablet 5  . Cyanocobalamin (VITAMIN B-12) 2500 MCG SUBL Take 1 tablet by mouth daily. Reported on 06/08/2016    . famotidine (PEPCID) 40 MG tablet Take 1 tablet (40 mg total) by mouth daily. 90 tablet 3  . iron polysaccharides (NIFEREX) 150 MG capsule Take 1 capsule (150 mg total) by mouth daily. 90 capsule 1  . metoprolol succinate (TOPROL-XL) 25 MG 24 hr tablet Take 1 tablet (25 mg total) by mouth daily. 90 tablet 3  . Multiple Vitamin (MULTIVITAMIN) capsule Take 1 capsule by mouth daily. Reported on 06/08/2016    . traMADol (ULTRAM) 50 MG tablet Take 1-2 tablets (50-100 mg total) by mouth every 6 (six) hours as needed for moderate pain or severe pain. 30 tablet 0  . Calcium-Magnesium-Vitamin D (CALCIUM 500 PO) Take by mouth daily.    . diphenhydramine-acetaminophen (TYLENOL PM) 25-500 MG TABS tablet Take 1 tablet by mouth.    . metroNIDAZOLE (METROCREAM) 0.75 % cream Apply 1 application topically 2 (two) times daily as needed (rosacea).     No current facility-administered medications for this encounter.     Physical Findings: The patient is in no acute distress. Patient is alert and oriented.  weight is 190 lb (86.2 kg). Her oral temperature is 98.1 F (36.7 C). Her blood pressure is 170/88 (abnormal) and her pulse is 53 (abnormal). Her respiration is 20. .   Well healed surgical incision in the UOQ of the left breast.  Lab Findings: Lab Results  Component Value Date   WBC 8.8 08/09/2016  HGB 11.1 (L) 10/09/2016   HCT 36.6 08/09/2016   MCV 81.0 08/09/2016   PLT 249 08/09/2016     Radiographic Findings: Mm Breast Surgical Specimen  Result Date: 10/09/2016 CLINICAL DATA:  Two specimen containers were sent to mammography. The first contains a radioactive seed. The second contains the biopsy clip and surrounding calcifications. EXAM: SPECIMEN RADIOGRAPH OF THE LEFT BREAST COMPARISON:  Previous exam(s). FINDINGS: Status post excision of the left breast. The  radioactive seed and biopsy marker clip are present, completely intact, and were marked for pathology. Two separate containers were were received, 1 with the radioactive seed and 1 with the specimen/biopsy clip. IMPRESSION: Specimen radiograph of the left breast. 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  I spoke to the patient today regarding her diagnosis and options for treatment. We discussed the equivalence in terms of survival and local failure between mastectomy and breast conservation. We discussed the role of radiation in decreasing local failures in patients who undergo lumpectomy. We discussed the process of simulation and the placement tattoos. We discussed 4 weeks of treatment as an outpatient. We discussed the possibility of asymptomatic lung damage. We discussed the low likelihood of secondary malignancies. We discussed the possible side effects including but not limited to skin redness, fatigue, permanent skin darkening, and breast swelling.  We discussed the use of cardiac sparing with deep inspiration breath hold if needed.  Plan: The patient signed a consent form and a copy was placed in her medical chart. The patient is scheduled to follow up with surgery next week in regards to her left breast seroma and skin. CT simulation will be scheduled in a week. ------------------------------------------------  Jodelle Gross, MD, PhD  This document serves as a record of services personally performed by Kyung Rudd, MD. It was created on his behalf by Darcus Austin, a trained medical scribe. The creation of this record is based on the scribe's personal observations and the provider's statements to them. This document has been checked and approved by the attending  provider.

## 2016-11-08 NOTE — Progress Notes (Signed)
Please see the Nurse Progress Note in the MD Initial Consult Encounter for this patient. 

## 2016-11-08 NOTE — Progress Notes (Signed)
Follow Up New Consult  Left Breast Cancer  \Diagnosis 10/09/2016: Dr. Rodman Key Tsuei,MD Follow up 11/16/16 1. Breast, lumpectomy, Left DUCTAL CARCINOMA IN SITU WITH CALCIFICATION AND FOCAL NECROSIS, GRADE 2 ALL MARGINS OF RESECTION ARE NEGATIVE FOR CARCINOMA 2. Lymph node, biopsy, Left Axillary Contents ONE BENIGN LYMPH NODE (0/1)  10/19/16: Dr. Nicholas Lose note Recommendation: 1. Adjuvant radiation therapy 3. Followed by antiestrogen therapy with tamoxifen 5 years  Return  Clinic after radiation to discuss antiestrogen treatment plan  left breast  Sore slight swollen still on antibiotics for this,  BP (!) 170/88 (BP Location: Right Arm, Patient Position: Sitting, Cuff Size: Large)   Pulse (!) 53   Temp 98.1 F (36.7 C) (Oral)   Resp 20   Wt 190 lb (86.2 kg)   BMI 31.62 kg/m   Wt Readings from Last 3 Encounters:  11/08/16 190 lb (86.2 kg)  10/24/16 189 lb (85.7 kg)  10/19/16 189 lb 8 oz (86 kg)

## 2016-11-09 ENCOUNTER — Ambulatory Visit: Payer: Medicare Other | Admitting: Internal Medicine

## 2016-11-15 ENCOUNTER — Ambulatory Visit
Admission: RE | Admit: 2016-11-15 | Discharge: 2016-11-15 | Disposition: A | Discharge status: Home / Self Care | Payer: Medicare Other | Source: Ambulatory Visit | Attending: Radiation Oncology | Admitting: Radiation Oncology

## 2016-11-15 DIAGNOSIS — C50412 Malignant neoplasm of upper-outer quadrant of left female breast: Secondary | ICD-10-CM

## 2016-11-15 DIAGNOSIS — Z170 Estrogen receptor positive status [ER+]: Principal | ICD-10-CM

## 2016-11-15 DIAGNOSIS — Z510 Encounter for antineoplastic radiation therapy: Secondary | ICD-10-CM | POA: Diagnosis not present

## 2016-11-16 DIAGNOSIS — Z853 Personal history of malignant neoplasm of breast: Secondary | ICD-10-CM

## 2016-11-16 DIAGNOSIS — Z510 Encounter for antineoplastic radiation therapy: Secondary | ICD-10-CM | POA: Diagnosis not present

## 2016-11-16 DIAGNOSIS — C50419 Malignant neoplasm of upper-outer quadrant of unspecified female breast: Secondary | ICD-10-CM | POA: Insufficient documentation

## 2016-11-16 HISTORY — DX: Personal history of malignant neoplasm of breast: Z85.3

## 2016-11-16 NOTE — Progress Notes (Addendum)
  Radiation Oncology         (336) [phone] ________________________________  Name: Christina Hardin MRN: [id number]  Date: 11/15/2016  DOB: 12-15-1950   DIAGNOSIS:     ICD-9-CM ICD-10-CM   1. Carcinoma of upper-outer quadrant of left breast in female, estrogen receptor positive ([place]) 174.4 C50.412    V86.0 Z17.0     SIMULATION AND TREATMENT PLANNING NOTE  The patient presented for simulation prior to beginning her course of radiation treatment for her diagnosis of left-sided breast cancer. The patient was placed in a supine position on a breast board. A customized vac-lock bag was constructed and this complex treatment device will be used on a daily basis during her treatment. In this fashion, a CT scan was obtained through the chest area and an isocenter was placed near the chest wall within the breast.  The patient will be planned to receive a course of radiation initially to a dose of 42.5 Gy. This will consist of a whole breast radiotherapy technique. To accomplish this, 2 customized blocks have been designed which will correspond to medial and lateral whole breast tangent fields. This treatment will be accomplished at 2.5 Gy per fraction. A forward planning technique will also be evaluated to determine if this approach improves the plan. It is anticipated that the patient will then receive a 10 Gy boost to the seroma cavity which has been contoured. This will be accomplished at 2.5 Gy per fraction.   This initial treatment will consist of a 3-D conformal technique. The seroma has been contoured as the primary target structure. Additionally, dose volume histograms of both this target as well as the lungs and heart will also be evaluated. Such an approach is necessary to ensure that the target area is adequately covered while the nearby critical  normal structures are adequately spared.  Plan:  The final anticipated total dose therefore will correspond to 52.5 Gy.   Special treatment  procedure was performed today due to the extra time and effort required by myself to plan and prepare this patient for deep inspiration breath hold technique.  I have determined cardiac sparing to be of benefit to this patient to prevent long term cardiac damage due to radiation of the heart.  Bellows were placed on the patient's abdomen. To facilitate cardiac sparing, the patient was coached by the radiation therapists on breath hold techniques and breathing practice was performed. Practice waveforms were obtained. The patient was then scanned while maintaining breath hold in the treatment position.  This image was then transferred over to the imaging specialist. The imaging specialist then created a fusion of the free breathing and breath hold scans using the chest wall as the stable structure. I personally reviewed the fusion in axial, coronal and sagittal image planes.  Excellent cardiac sparing was obtained.  I felt the patient is an appropriate candidate for breath hold and the patient will be treated as such.  The image fusion was then reviewed with the patient to reinforce the necessity of reproducible breath hold.      _______________________________   Jodelle Gross, MD, PhD

## 2016-11-16 NOTE — Progress Notes (Signed)
  Radiation Oncology         (336) [phone] ________________________________  Name: Christina Hardin MRN: [id number]  Date: 11/15/2016  DOB: Feb 05, 1951  Optical Surface Tracking Plan:  Since intensity modulated radiotherapy (IMRT) and 3D conformal radiation treatment methods are predicated on accurate and precise positioning for treatment, intrafraction motion monitoring is medically necessary to ensure accurate and safe treatment delivery.  The ability to quantify intrafraction motion without excessive ionizing radiation dose can only be performed with optical surface tracking. Accordingly, surface imaging offers the opportunity to obtain 3D measurements of patient position throughout IMRT and 3D treatments without excessive radiation exposure.  I am ordering optical surface tracking for this patient's upcoming course of radiotherapy. ________________________________  Kyung Rudd, MD 11/16/2016 4:37 PM    Reference:   Ursula Alert, J, et al. Surface imaging-based analysis of intrafraction motion for breast radiotherapy patients.Journal of [place], n. 6, nov. 2014. ISSN [id number].   Available at: <http://www.jacmp.org/index.php/jacmp/article/view/4957>.

## 2016-11-16 NOTE — Addendum Note (Signed)
Encounter addended by: Kyung Rudd, MD on: 11/16/2016  5:03 PM<BR>    Actions taken: Sign clinical note

## 2016-11-21 ENCOUNTER — Telehealth: Payer: Self-pay | Admitting: Hematology and Oncology

## 2016-11-21 NOTE — Telephone Encounter (Signed)
sw pt to confirm 12/26/16 appt date/timeper LOS

## 2016-11-22 ENCOUNTER — Ambulatory Visit
Admission: RE | Admit: 2016-11-22 | Discharge: 2016-11-22 | Disposition: A | Discharge status: Home / Self Care | Payer: Medicare Other | Source: Ambulatory Visit | Attending: Radiation Oncology | Admitting: Radiation Oncology

## 2016-11-22 DIAGNOSIS — Z510 Encounter for antineoplastic radiation therapy: Secondary | ICD-10-CM | POA: Diagnosis not present

## 2016-11-23 ENCOUNTER — Ambulatory Visit: Payer: Medicare Other

## 2016-11-27 ENCOUNTER — Ambulatory Visit
Admission: RE | Admit: 2016-11-27 | Discharge: 2016-11-27 | Disposition: A | Discharge status: Home / Self Care | Payer: Medicare Other | Source: Ambulatory Visit | Attending: Radiation Oncology | Admitting: Radiation Oncology

## 2016-11-27 DIAGNOSIS — Z510 Encounter for antineoplastic radiation therapy: Secondary | ICD-10-CM | POA: Diagnosis not present

## 2016-11-28 ENCOUNTER — Ambulatory Visit
Admission: RE | Admit: 2016-11-28 | Discharge: 2016-11-28 | Disposition: A | Discharge status: Home / Self Care | Payer: Medicare Other | Source: Ambulatory Visit | Attending: Radiation Oncology | Admitting: Radiation Oncology

## 2016-11-28 DIAGNOSIS — Z510 Encounter for antineoplastic radiation therapy: Secondary | ICD-10-CM | POA: Diagnosis not present

## 2016-11-29 ENCOUNTER — Ambulatory Visit
Admission: RE | Admit: 2016-11-29 | Discharge: 2016-11-29 | Disposition: A | Discharge status: Home / Self Care | Payer: Medicare Other | Source: Ambulatory Visit | Attending: Radiation Oncology | Admitting: Radiation Oncology

## 2016-11-29 ENCOUNTER — Ambulatory Visit: Admission: RE | Admit: 2016-11-29 | Payer: Medicare Other | Source: Ambulatory Visit

## 2016-11-29 DIAGNOSIS — Z510 Encounter for antineoplastic radiation therapy: Secondary | ICD-10-CM | POA: Diagnosis not present

## 2016-11-30 ENCOUNTER — Ambulatory Visit
Admission: RE | Admit: 2016-11-30 | Discharge: 2016-11-30 | Disposition: A | Discharge status: Home / Self Care | Payer: Medicare Other | Source: Ambulatory Visit | Attending: Radiation Oncology | Admitting: Radiation Oncology

## 2016-11-30 ENCOUNTER — Encounter: Payer: Self-pay | Admitting: Radiation Oncology

## 2016-11-30 VITALS — BP 130/83 | HR 76 | Temp 98.3°F | Resp 20 | Wt 189.8 lb

## 2016-11-30 DIAGNOSIS — C50412 Malignant neoplasm of upper-outer quadrant of left female breast: Secondary | ICD-10-CM

## 2016-11-30 DIAGNOSIS — Z510 Encounter for antineoplastic radiation therapy: Secondary | ICD-10-CM | POA: Diagnosis not present

## 2016-11-30 DIAGNOSIS — C50411 Malignant neoplasm of upper-outer quadrant of right female breast: Secondary | ICD-10-CM

## 2016-11-30 MED ORDER — RADIAPLEXRX EX GEL: Freq: Once | CUTANEOUS | Status: DC

## 2016-11-30 MED ORDER — ALRA NON-METALLIC DEODORANT (RAD-ONC): 1.0000 "application " | Freq: Once | TOPICAL | Status: DC

## 2016-11-30 MED ORDER — RADIAPLEXRX EX GEL
Freq: Once | CUTANEOUS | Status: AC
  Administered 2016-11-30: 13:00:00 via TOPICAL

## 2016-11-30 MED ORDER — ALRA NON-METALLIC DEODORANT (RAD-ONC)
1.0000 "application " | Freq: Once | TOPICAL | Status: AC
  Administered 2016-11-30: 1 via TOPICAL

## 2016-11-30 NOTE — Progress Notes (Signed)
Weekly rad txs breas t4/21  Completed, , radiation therapy and you book, my business card, alra deodorant,radiaplex gel, discussed side effects/mgt, skin irritation, breast tenderness/swelling, sharp pains in breast occasionally, use of radiaplex on breast and axilla after rad tx and bedtome, alra after rad tx, luke warm showers/bath, use of dove soap unscented, pat dry, use of electric razor only, no underwire bra, increase protein in diet, drink plenty water,teach back given No skin changes, dryness noted on breast, no c/o pain, appetite good, drinking water 2:16 PM BP 130/83 (BP Location: Right Arm, Patient Position: Sitting, Cuff Size: Large)   Pulse 76   Temp 98.3 F (36.8 C) (Oral)   Resp 20   Wt 189 lb 12.8 oz (86.1 kg)   BMI 31.58 kg/m   Wt Readings from Last 3 Encounters:  11/30/16 189 lb 12.8 oz (86.1 kg)  11/08/16 190 lb (86.2 kg)  10/24/16 189 lb (85.7 kg)

## 2016-11-30 NOTE — Progress Notes (Signed)
Department of Radiation Oncology  Phone:  [phone] Fax:        [phone]  Weekly Treatment Note    Name: Christina Hardin Date: 11/30/2016 MRN: [id number] DOB: Oct 06, 1951   Diagnosis:     ICD-9-CM ICD-10-CM   1. 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 Acetaminophen (TYLENOL ARTHRITIS PAIN PO) Take by mouth.    Marland Kitchen apixaban (ELIQUIS) 5 MG TABS tablet Take 1 tablet (5 mg total) by mouth 2 (two) times daily. 60 tablet 5  . Calcium-Magnesium-Vitamin D (CALCIUM 500 PO) Take by mouth daily.    . Cyanocobalamin (VITAMIN B-12) 2500 MCG SUBL Take 1 tablet by mouth daily. Reported on 06/08/2016    . diphenhydramine-acetaminophen (TYLENOL PM) 25-500 MG TABS tablet Take 1 tablet by mouth.    . famotidine (PEPCID) 40 MG tablet Take 1 tablet (40 mg total) by mouth daily. 90 tablet 3  . hyaluronate sodium (RADIAPLEXRX) GEL Apply 1 application topically 2 (two) times daily.    . iron polysaccharides (NIFEREX) 150 MG capsule Take 1 capsule (150 mg total) by mouth daily. 90 capsule 1  . metoprolol succinate (TOPROL-XL) 25 MG 24 hr tablet Take 1 tablet (25 mg total) by mouth daily. 90 tablet 3  . metroNIDAZOLE (METROCREAM) 0.75 % cream Apply 1 application topically 2 (two) times daily as needed (rosacea).    . Multiple Vitamin (MULTIVITAMIN) capsule Take 1 capsule by mouth daily. Reported on 0000000    . non-metallic deodorant Jethro Poling) MISC Apply 1 application topically daily as needed.    . gabapentin (NEURONTIN) 100 MG capsule Take 100 mg by mouth 3 (three) times daily.   3  . traMADol (ULTRAM) 50 MG tablet Take 1-2 tablets (50-100 mg total) by mouth every 6 (six) hours as needed for moderate  pain or severe pain. (Patient not taking: Reported on 11/30/2016) 30 tablet 0   Current Facility-Administered Medications  Medication Dose Route Frequency Provider Last Rate Last Dose  . hyaluronate sodium (RADIAPLEXRX) gel   Topical Once Hayden Pedro, PA-C      . non-metallic deodorant Jethro Poling) 1 application  1 application Topical Once Hayden Pedro, PA-C         ALLERGIES: Dilaudid [hydromorphone hcl]; Hydrochlorothiazide; and Propoxyphene hcl   LABORATORY DATA:  Lab Results  Component Value Date   WBC 8.8 08/09/2016   HGB 11.1 (L) 10/09/2016   HCT 36.6 08/09/2016   MCV 81.0 08/09/2016   PLT 249 08/09/2016   Lab Results  Component Value Date   NA 140 10/02/2016   K 4.0 10/02/2016   CL 104 10/02/2016   CO2 27 10/02/2016   Lab Results  Component Value Date   ALT 7 05/24/2016   AST 15 05/24/2016   ALKPHOS 72 05/24/2016   BILITOT 0.4 05/24/2016     NARRATIVE: Christina Hardin was seen today for weekly treatment management. The chart was checked and the patient's films were reviewed.  The patient has completed 4/21 fractions to her breast. She reports no pain. She has a good appetite. The patient is without complaint at this time.  PHYSICAL EXAMINATION: weight is 189 lb 12.8 oz (86.1 kg). Her oral temperature is 98.3 F (36.8 C). Her blood pressure is  130/83 and her pulse is 76. Her respiration is 20.   The patient is well appearing. Her affect is normal.  ASSESSMENT: The patient is doing satisfactorily with treatment.  PLAN: We will continue with the patient's radiation treatment as planned.     ------------------------------------------------  Jodelle Gross, MD, PhD  This document serves as a record of services personally performed by Kyung Rudd, MD. It was created on his behalf by Maryla Morrow, a trained medical scribe. The creation of this record is based on the scribe's personal observations and the provider's statements to them. This document  has been checked and approved by the attending provider.

## 2016-12-04 ENCOUNTER — Encounter: Payer: Self-pay | Admitting: *Deleted

## 2016-12-04 ENCOUNTER — Ambulatory Visit
Admission: RE | Admit: 2016-12-04 | Discharge: 2016-12-04 | Disposition: A | Discharge status: Home / Self Care | Payer: Medicare Other | Source: Ambulatory Visit | Attending: Radiation Oncology | Admitting: Radiation Oncology

## 2016-12-04 DIAGNOSIS — Z510 Encounter for antineoplastic radiation therapy: Secondary | ICD-10-CM | POA: Diagnosis not present

## 2016-12-05 ENCOUNTER — Ambulatory Visit
Admission: RE | Admit: 2016-12-05 | Discharge: 2016-12-05 | Disposition: A | Discharge status: Home / Self Care | Payer: Medicare Other | Source: Ambulatory Visit | Attending: Radiation Oncology | Admitting: Radiation Oncology

## 2016-12-05 DIAGNOSIS — Z510 Encounter for antineoplastic radiation therapy: Secondary | ICD-10-CM | POA: Diagnosis not present

## 2016-12-06 ENCOUNTER — Ambulatory Visit
Admission: RE | Admit: 2016-12-06 | Discharge: 2016-12-06 | Disposition: A | Discharge status: Home / Self Care | Payer: Medicare Other | Source: Ambulatory Visit | Attending: Radiation Oncology | Admitting: Radiation Oncology

## 2016-12-06 DIAGNOSIS — Z510 Encounter for antineoplastic radiation therapy: Secondary | ICD-10-CM | POA: Diagnosis not present

## 2016-12-07 ENCOUNTER — Encounter: Payer: Self-pay | Admitting: Radiation Oncology

## 2016-12-07 ENCOUNTER — Ambulatory Visit
Admission: RE | Admit: 2016-12-07 | Discharge: 2016-12-07 | Disposition: A | Discharge status: Home / Self Care | Payer: Medicare Other | Source: Ambulatory Visit | Attending: Radiation Oncology | Admitting: Radiation Oncology

## 2016-12-07 VITALS — BP 153/88 | HR 68 | Temp 98.3°F | Resp 18 | Wt 190.0 lb

## 2016-12-07 DIAGNOSIS — C50411 Malignant neoplasm of upper-outer quadrant of right female breast: Secondary | ICD-10-CM

## 2016-12-07 DIAGNOSIS — Z510 Encounter for antineoplastic radiation therapy: Secondary | ICD-10-CM | POA: Diagnosis not present

## 2016-12-07 NOTE — Progress Notes (Signed)
Weekly rad txs left breast 8/21 completed, mild  Pink color, skin intact, using radiaplex 2-3x day, appetite good,  No pain . BP (!) 153/88 (BP Location: Right Arm, Patient Position: Sitting, Cuff Size: Large)   Pulse 68   Temp 98.3 F (36.8 C) (Oral)   Resp 18   Wt 190 lb (86.2 kg)   BMI 31.62 kg/m   Wt Readings from Last 3 Encounters:  12/07/16 190 lb (86.2 kg)  11/30/16 189 lb 12.8 oz (86.1 kg)  11/08/16 190 lb (86.2 kg)

## 2016-12-07 NOTE — Progress Notes (Signed)
Department of Radiation Oncology  Phone:  [phone] Fax:        [phone]  Weekly Treatment Note    Name: Christina Hardin Date: 12/07/2016 MRN: [id number] DOB: 20-Jan-1951   Diagnosis:     ICD-9-CM ICD-10-CM   1. Carcinoma of upper-outer quadrant of right female breast, unspecified estrogen receptor status ([place]) 174.4 C50.411      Current dose: 20 Gy  Current fraction: 8   MEDICATIONS: Current Outpatient Prescriptions  Medication Sig Dispense Refill  . Acetaminophen (TYLENOL ARTHRITIS PAIN PO) Take by mouth.    Marland Kitchen apixaban (ELIQUIS) 5 MG TABS tablet Take 1 tablet (5 mg total) by mouth 2 (two) times daily. 60 tablet 5  . Calcium-Magnesium-Vitamin D (CALCIUM 500 PO) Take by mouth daily.    . Cyanocobalamin (VITAMIN B-12) 2500 MCG SUBL Take 1 tablet by mouth daily. Reported on 06/08/2016    . diphenhydramine-acetaminophen (TYLENOL PM) 25-500 MG TABS tablet Take 1 tablet by mouth.    . famotidine (PEPCID) 40 MG tablet Take 1 tablet (40 mg total) by mouth daily. 90 tablet 3  . gabapentin (NEURONTIN) 100 MG capsule Take 100 mg by mouth 3 (three) times daily.   3  . hyaluronate sodium (RADIAPLEXRX) GEL Apply 1 application topically 2 (two) times daily.    . iron polysaccharides (NIFEREX) 150 MG capsule Take 1 capsule (150 mg total) by mouth daily. 90 capsule 1  . metoprolol succinate (TOPROL-XL) 25 MG 24 hr tablet Take 1 tablet (25 mg total) by mouth daily. 90 tablet 3  . metroNIDAZOLE (METROCREAM) 0.75 % cream Apply 1 application topically 2 (two) times daily as needed (rosacea).    . Multiple Vitamin (MULTIVITAMIN) capsule Take 1 capsule by mouth daily. Reported on 0000000    . non-metallic deodorant Jethro Poling) MISC Apply 1 application topically daily as needed.     No current facility-administered medications for this encounter.      ALLERGIES: Dilaudid [hydromorphone hcl]; Hydrochlorothiazide; and Propoxyphene hcl   LABORATORY DATA:  Lab Results  Component Value Date   WBC 8.8 08/09/2016   HGB 11.1 (L) 10/09/2016   HCT 36.6 08/09/2016   MCV 81.0 08/09/2016   PLT 249 08/09/2016   Lab Results  Component Value Date   NA 140 10/02/2016   K 4.0 10/02/2016   CL 104 10/02/2016   CO2 27 10/02/2016   Lab Results  Component Value Date   ALT 7 05/24/2016   AST 15 05/24/2016   ALKPHOS 72 05/24/2016   BILITOT 0.4 05/24/2016     NARRATIVE: Christina Hardin was seen today for weekly treatment management. The chart was checked and the patient's films were reviewed.  Weekly rad txs left breast 8/21 completed, mild  Pink color, skin intact, using radiaplex 2-3x day, appetite good,  No pain . BP (!) 153/88 (BP Location: Right Arm, Patient Position: Sitting, Cuff Size: Large)   Pulse 68   Temp 98.3 F (36.8 C) (Oral)   Resp 18   Wt 190 lb (86.2 kg)   BMI 31.62 kg/m   Wt Readings from Last 3 Encounters:  12/07/16 190 lb (86.2 kg)  11/30/16 189 lb 12.8 oz (86.1 kg)  11/08/16 190 lb (86.2 kg)    PHYSICAL EXAMINATION: weight is 190 lb (86.2 kg). Her oral temperature is 98.3 F (36.8 C). Her blood pressure is 153/88 (abnormal) and her pulse is 68. Her respiration is 18.      Mild erythema in the treatment area  ASSESSMENT: The patient is  doing satisfactorily with treatment.  PLAN: We will continue with the patient's radiation treatment as planned.

## 2016-12-10 ENCOUNTER — Ambulatory Visit
Admission: RE | Admit: 2016-12-10 | Discharge: 2016-12-10 | Disposition: A | Discharge status: Home / Self Care | Payer: Medicare Other | Source: Ambulatory Visit | Attending: Radiation Oncology | Admitting: Radiation Oncology

## 2016-12-10 ENCOUNTER — Ambulatory Visit: Payer: Medicare Other | Admitting: Internal Medicine

## 2016-12-10 DIAGNOSIS — Z510 Encounter for antineoplastic radiation therapy: Secondary | ICD-10-CM | POA: Diagnosis not present

## 2016-12-11 ENCOUNTER — Ambulatory Visit
Admission: RE | Admit: 2016-12-11 | Discharge: 2016-12-11 | Disposition: A | Discharge status: Home / Self Care | Payer: Medicare Other | Source: Ambulatory Visit | Attending: Radiation Oncology | Admitting: Radiation Oncology

## 2016-12-11 DIAGNOSIS — Z510 Encounter for antineoplastic radiation therapy: Secondary | ICD-10-CM | POA: Diagnosis not present

## 2016-12-12 ENCOUNTER — Ambulatory Visit
Admission: RE | Admit: 2016-12-12 | Discharge: 2016-12-12 | Disposition: A | Discharge status: Home / Self Care | Payer: Medicare Other | Source: Ambulatory Visit | Attending: Radiation Oncology | Admitting: Radiation Oncology

## 2016-12-12 DIAGNOSIS — Z510 Encounter for antineoplastic radiation therapy: Secondary | ICD-10-CM | POA: Diagnosis not present

## 2016-12-13 ENCOUNTER — Ambulatory Visit
Admission: RE | Admit: 2016-12-13 | Discharge: 2016-12-13 | Disposition: A | Discharge status: Home / Self Care | Payer: Medicare Other | Source: Ambulatory Visit | Attending: Radiation Oncology | Admitting: Radiation Oncology

## 2016-12-13 DIAGNOSIS — Z510 Encounter for antineoplastic radiation therapy: Secondary | ICD-10-CM | POA: Diagnosis not present

## 2016-12-14 ENCOUNTER — Encounter: Payer: Self-pay | Admitting: Radiation Oncology

## 2016-12-14 ENCOUNTER — Ambulatory Visit
Admission: RE | Admit: 2016-12-14 | Discharge: 2016-12-14 | Disposition: A | Discharge status: Home / Self Care | Payer: Medicare Other | Source: Ambulatory Visit | Attending: Radiation Oncology | Admitting: Radiation Oncology

## 2016-12-14 VITALS — BP 118/74 | HR 100 | Temp 98.3°F | Resp 20 | Wt 191.0 lb

## 2016-12-14 DIAGNOSIS — C50411 Malignant neoplasm of upper-outer quadrant of right female breast: Secondary | ICD-10-CM

## 2016-12-14 DIAGNOSIS — Z510 Encounter for antineoplastic radiation therapy: Secondary | ICD-10-CM | POA: Diagnosis not present

## 2016-12-14 NOTE — Progress Notes (Signed)
Weekly  Rad txs left breast  13/21  Completed,  No skin changes skin intact, using radiaplex  Bid, has been fatiued, and sleepy, no bowel movement in 1 week, stomach crampy, has glycerin suppositories at home appetite good, 2:38 PM BP 118/74 (BP Location: Right Arm, Patient Position: Sitting, Cuff Size: Large)   Pulse 100   Temp 98.3 F (36.8 C) (Oral)   Resp 20   Wt 191 lb (86.6 kg)   BMI 31.78 kg/m   Wt Readings from Last 3 Encounters:  12/14/16 191 lb (86.6 kg)  12/07/16 190 lb (86.2 kg)  11/30/16 189 lb 12.8 oz (86.1 kg)

## 2016-12-14 NOTE — Progress Notes (Signed)
Department of Radiation Oncology  Phone:  [phone] Fax:        [phone]  Weekly Treatment Note    Name: Christina Hardin Date: 12/14/2016 MRN: [id number] DOB: 1951-07-24   Diagnosis:     ICD-9-CM ICD-10-CM   1. Carcinoma of upper-outer quadrant of right female breast, unspecified estrogen receptor status ([place]) 174.4 C50.411      Current dose: 32.5 Gy  Current fraction: 13   MEDICATIONS: Current Outpatient Prescriptions  Medication Sig Dispense Refill  . Acetaminophen (TYLENOL ARTHRITIS PAIN PO) Take by mouth.    Marland Kitchen apixaban (ELIQUIS) 5 MG TABS tablet Take 1 tablet (5 mg total) by mouth 2 (two) times daily. 60 tablet 5  . Calcium-Magnesium-Vitamin D (CALCIUM 500 PO) Take by mouth daily.    . Cyanocobalamin (VITAMIN B-12) 2500 MCG SUBL Take 1 tablet by mouth daily. Reported on 06/08/2016    . diphenhydramine-acetaminophen (TYLENOL PM) 25-500 MG TABS tablet Take 1 tablet by mouth.    . famotidine (PEPCID) 40 MG tablet Take 1 tablet (40 mg total) by mouth daily. 90 tablet 3  . gabapentin (NEURONTIN) 100 MG capsule Take 100 mg by mouth 3 (three) times daily.   3  . hyaluronate sodium (RADIAPLEXRX) GEL Apply 1 application topically 2 (two) times daily.    . iron polysaccharides (NIFEREX) 150 MG capsule Take 1 capsule (150 mg total) by mouth daily. 90 capsule 1  . metoprolol succinate (TOPROL-XL) 25 MG 24 hr tablet Take 1 tablet (25 mg total) by mouth daily. 90 tablet 3  . metroNIDAZOLE (METROCREAM) 0.75 % cream Apply 1 application topically 2 (two) times daily as needed (rosacea).    . Multiple Vitamin (MULTIVITAMIN) capsule Take 1 capsule by mouth daily. Reported on 0000000    . non-metallic deodorant Jethro Poling) MISC Apply 1 application topically daily as needed.     No current facility-administered medications for this encounter.      ALLERGIES: Dilaudid [hydromorphone hcl]; Hydrochlorothiazide; and Propoxyphene hcl   LABORATORY DATA:  Lab Results  Component Value  Date   WBC 8.8 08/09/2016   HGB 11.1 (L) 10/09/2016   HCT 36.6 08/09/2016   MCV 81.0 08/09/2016   PLT 249 08/09/2016   Lab Results  Component Value Date   NA 140 10/02/2016   K 4.0 10/02/2016   CL 104 10/02/2016   CO2 27 10/02/2016   Lab Results  Component Value Date   ALT 7 05/24/2016   AST 15 05/24/2016   ALKPHOS 72 05/24/2016   BILITOT 0.4 05/24/2016     NARRATIVE: Christina Hardin was seen today for weekly treatment management. The chart was checked and the patient's films were reviewed.  Weekly  Rad txs left breast  13/21  Completed,  No skin changes skin intact, using radiaplex  Bid, has been fatiued, and sleepy, no bowel movement in 1 week, stomach crampy, has glycerin suppositories at home appetite good, 6:28 PM BP 118/74 (BP Location: Right Arm, Patient Position: Sitting, Cuff Size: Large)   Pulse 100   Temp 98.3 F (36.8 C) (Oral)   Resp 20   Wt 191 lb (86.6 kg)   BMI 31.78 kg/m   Wt Readings from Last 3 Encounters:  12/14/16 191 lb (86.6 kg)  12/07/16 190 lb (86.2 kg)  11/30/16 189 lb 12.8 oz (86.1 kg)    PHYSICAL EXAMINATION: weight is 191 lb (86.6 kg). Her oral temperature is 98.3 F (36.8 C). Her blood pressure is 118/74 and her pulse is 100. Her respiration  is 20.      The patient's skin shows some mild erythema in the treatment area  ASSESSMENT: The patient is doing satisfactorily with treatment.  PLAN: We will continue with the patient's radiation treatment as planned.

## 2016-12-17 ENCOUNTER — Other Ambulatory Visit: Payer: Self-pay | Admitting: Internal Medicine

## 2016-12-17 ENCOUNTER — Ambulatory Visit: Payer: Medicare Other | Admitting: Radiation Oncology

## 2016-12-17 ENCOUNTER — Ambulatory Visit
Admission: RE | Admit: 2016-12-17 | Discharge: 2016-12-17 | Disposition: A | Discharge status: Home / Self Care | Payer: Medicare Other | Source: Ambulatory Visit | Attending: Radiation Oncology | Admitting: Radiation Oncology

## 2016-12-17 DIAGNOSIS — Z510 Encounter for antineoplastic radiation therapy: Secondary | ICD-10-CM | POA: Diagnosis not present

## 2016-12-18 ENCOUNTER — Ambulatory Visit
Admission: RE | Admit: 2016-12-18 | Discharge: 2016-12-18 | Disposition: A | Discharge status: Home / Self Care | Payer: Medicare Other | Source: Ambulatory Visit | Attending: Radiation Oncology | Admitting: Radiation Oncology

## 2016-12-18 ENCOUNTER — Ambulatory Visit: Payer: Medicare Other | Admitting: Radiation Oncology

## 2016-12-18 DIAGNOSIS — Z510 Encounter for antineoplastic radiation therapy: Secondary | ICD-10-CM | POA: Diagnosis not present

## 2016-12-19 ENCOUNTER — Ambulatory Visit: Payer: Medicare Other

## 2016-12-19 ENCOUNTER — Ambulatory Visit: Payer: Medicare Other | Admitting: Radiation Oncology

## 2016-12-20 ENCOUNTER — Ambulatory Visit
Admission: RE | Admit: 2016-12-20 | Discharge: 2016-12-20 | Disposition: A | Discharge status: Home / Self Care | Payer: Medicare Other | Source: Ambulatory Visit | Attending: Radiation Oncology | Admitting: Radiation Oncology

## 2016-12-20 DIAGNOSIS — Z510 Encounter for antineoplastic radiation therapy: Secondary | ICD-10-CM | POA: Diagnosis not present

## 2016-12-21 ENCOUNTER — Ambulatory Visit
Admission: RE | Admit: 2016-12-21 | Discharge: 2016-12-21 | Disposition: A | Discharge status: Home / Self Care | Payer: Medicare Other | Source: Ambulatory Visit | Attending: Radiation Oncology | Admitting: Radiation Oncology

## 2016-12-21 ENCOUNTER — Encounter: Payer: Self-pay | Admitting: Radiation Oncology

## 2016-12-21 ENCOUNTER — Ambulatory Visit: Payer: Medicare Other

## 2016-12-21 VITALS — BP 113/81 | HR 87 | Temp 99.1°F | Resp 16 | Wt 187.8 lb

## 2016-12-21 DIAGNOSIS — C50411 Malignant neoplasm of upper-outer quadrant of right female breast: Secondary | ICD-10-CM

## 2016-12-21 DIAGNOSIS — Z510 Encounter for antineoplastic radiation therapy: Secondary | ICD-10-CM | POA: Diagnosis not present

## 2016-12-21 NOTE — Progress Notes (Addendum)
Weekly rad txs  Left  Breast,  Mild erythema, skin intact,  Uses radiaplex bid, felt dizzy and slept  Most of the day yesterday, low tempo 99/1 today,encouraged to drink more water,rest and take temp  Later if felling bad again, feels better today nerve pain around nipple and goes around up side to under her arm,gabapentin tid helps a lot 2:16 PM Wt Readings from Last 3 Encounters:  12/14/16 191 lb (86.6 kg)  12/07/16 190 lb (86.2 kg)  11/30/16 189 lb 12.8 oz (86.1 kg)   BP 113/81 (BP Location: Right Arm, Patient Position: Sitting, Cuff Size: Large)   Pulse 87   Temp 99.1 F (37.3 C) (Oral)   Resp 16   Wt 187 lb 12.8 oz (85.2 kg)   BMI 31.25 kg/m

## 2016-12-21 NOTE — Progress Notes (Signed)
   Department of Radiation Oncology  Phone:  [phone] Fax:        [phone]  Weekly Treatment Note    Name: Christina Hardin Date: 12/21/2016 MRN: [id number] DOB: [birth year]   Diagnosis:     ICD-9-CM ICD-10-CM   1. Carcinoma of upper-outer quadrant of right female breast, unspecified estrogen receptor status ([place]) 174.4 C50.411      Current dose: 42.5 Gy  Current fraction: 17   MEDICATIONS: Current Outpatient Prescriptions  Medication Sig Dispense Refill  . Acetaminophen (TYLENOL ARTHRITIS PAIN PO) Take by mouth.    . Calcium-Magnesium-Vitamin D (CALCIUM 500 PO) Take by mouth daily.    . Cyanocobalamin (VITAMIN B-12) 2500 MCG SUBL Take 1 tablet by mouth daily. Reported on 06/08/2016    . diphenhydramine-acetaminophen (TYLENOL PM) 25-500 MG TABS tablet Take 1 tablet by mouth.    [name] 5 MG TABS tablet TAKE 1 TABLET BY MOUTH 2 TIMES DAILY. 60 tablet 3  . famotidine (PEPCID) 40 MG tablet Take 1 tablet (40 mg total) by mouth daily. 90 tablet 3  . gabapentin (NEURONTIN) 100 MG capsule Take 100 mg by mouth 3 (three) times daily.   3  . hyaluronate sodium (RADIAPLEXRX) GEL Apply 1 application topically 2 (two) times daily.    . iron polysaccharides (NIFEREX) 150 MG capsule Take 1 capsule (150 mg total) by mouth daily. 90 capsule 1  . metoprolol succinate (TOPROL-XL) 25 MG 24 hr tablet Take 1 tablet (25 mg total) by mouth daily. 90 tablet 3  . metroNIDAZOLE (METROCREAM) 0.75 % cream Apply 1 application topically 2 (two) times daily as needed (rosacea).    . Multiple Vitamin (MULTIVITAMIN) capsule Take 1 capsule by mouth daily. Reported on 0000000    . non-metallic deodorant Jethro Poling) MISC Apply 1 application topically daily as needed.     No current facility-administered medications for this encounter.      ALLERGIES: Dilaudid [hydromorphone hcl]; Hydrochlorothiazide; and Propoxyphene hcl   LABORATORY DATA:  Lab Results  Component Value Date   WBC 8.8 08/09/2016   HGB  11.1 (L) 10/09/2016   HCT 36.6 08/09/2016   MCV 81.0 08/09/2016   PLT 249 08/09/2016   Lab Results  Component Value Date   NA 140 10/02/2016   K 4.0 10/02/2016   CL 104 10/02/2016   CO2 27 10/02/2016   Lab Results  Component Value Date   ALT 7 05/24/2016   AST 15 05/24/2016   ALKPHOS 72 05/24/2016   BILITOT 0.4 05/24/2016     NARRATIVE: Christina Hardin was seen today for weekly treatment management. The chart was checked and the patient's films were reviewed.  The patient is doing well as she nears completion of her treatment. Some irritative symptoms in the treatment area but this has been very moderate.  PHYSICAL EXAMINATION: weight is 187 lb 12.8 oz (85.2 kg). Her oral temperature is 99.1 F (37.3 C). Her blood pressure is 113/81 and her pulse is 87. Her respiration is 16.      Mild to moderate erythema in the treatment area. No significant desquamation.  ASSESSMENT: The patient is doing satisfactorily with treatment.  PLAN: We will continue with the patient's radiation treatment as planned.

## 2016-12-21 NOTE — Progress Notes (Signed)
Complex simulation note  Diagnosis: left-sided breast cancer  Narrative The patient has initially been planned to receive a course of whole breast radiation to a dose of 42.5 Gy in 17 fractions. The patient will now receive an additional boost to the seroma cavity which has been contoured. This will correspond to a boost of 10 Gy at 2.5 Gy per fraction. To accomplish this, an additional 3 customized blocks have been designed for this purpose. A complex isodose plan is requested to ensure that the target area is adequately covered with radiation dose and that the nearby normal structures such as the lung are adequately spared. The patient's final total dose will be 52.5 Gy.  ------------------------------------------------  Christina Gross, MD, PhD

## 2016-12-24 ENCOUNTER — Ambulatory Visit: Payer: Medicare Other

## 2016-12-24 ENCOUNTER — Ambulatory Visit
Admission: RE | Admit: 2016-12-24 | Discharge: 2016-12-24 | Disposition: A | Discharge status: Home / Self Care | Payer: Medicare Other | Source: Ambulatory Visit | Attending: Radiation Oncology | Admitting: Radiation Oncology

## 2016-12-24 DIAGNOSIS — Z510 Encounter for antineoplastic radiation therapy: Secondary | ICD-10-CM | POA: Diagnosis not present

## 2016-12-25 ENCOUNTER — Ambulatory Visit
Admission: RE | Admit: 2016-12-25 | Discharge: 2016-12-25 | Disposition: A | Discharge status: Home / Self Care | Payer: Medicare Other | Source: Ambulatory Visit | Attending: Radiation Oncology | Admitting: Radiation Oncology

## 2016-12-25 DIAGNOSIS — Z510 Encounter for antineoplastic radiation therapy: Secondary | ICD-10-CM | POA: Diagnosis not present

## 2016-12-25 NOTE — Assessment & Plan Note (Signed)
10/09/2016: Left lumpectomy: DCIS with calcifications and focal necrosis, grade 2, margins negative, 0/1 lymph node negative, ER 100%, PR 95%, Tis N0 stage 0 Left breast MRI 09/17/2016: 2.6 x 4.2 x 1.4 cm area of enhancement at left breast 2:00 position  Adj XRT: 11/27/16 to 12/25/16 Plan: Start Tamoxifen in 2 weeks  RTC in 3 months after starting tamoxifen

## 2016-12-26 ENCOUNTER — Encounter: Payer: Self-pay | Admitting: Hematology and Oncology

## 2016-12-26 ENCOUNTER — Ambulatory Visit: Payer: Medicare Other

## 2016-12-26 ENCOUNTER — Ambulatory Visit
Admission: RE | Admit: 2016-12-26 | Discharge: 2016-12-26 | Disposition: A | Discharge status: Home / Self Care | Payer: Medicare Other | Source: Ambulatory Visit | Attending: Radiation Oncology | Admitting: Radiation Oncology

## 2016-12-26 ENCOUNTER — Ambulatory Visit (HOSPITAL_BASED_OUTPATIENT_CLINIC_OR_DEPARTMENT_OTHER): Payer: Medicare Other | Admitting: Hematology and Oncology

## 2016-12-26 VITALS — BP 118/83 | HR 98 | Temp 98.1°F | Resp 18 | Wt 191.0 lb

## 2016-12-26 DIAGNOSIS — D0512 Intraductal carcinoma in situ of left breast: Secondary | ICD-10-CM | POA: Diagnosis not present

## 2016-12-26 DIAGNOSIS — Z510 Encounter for antineoplastic radiation therapy: Secondary | ICD-10-CM | POA: Diagnosis not present

## 2016-12-26 MED ORDER — TAMOXIFEN CITRATE 20 MG PO TABS: 20.0000 mg | ORAL_TABLET | Freq: Every day | ORAL | 3 refills | Status: DC

## 2016-12-26 NOTE — Progress Notes (Signed)
Patient Care Team: Zenia Resides, MD as PCP - General  DIAGNOSIS:  Encounter Diagnosis  Name Primary?  . Ductal carcinoma in situ (DCIS) of left breast Yes    SUMMARY OF ONCOLOGIC HISTORY:   DCIS (ductal carcinoma in situ) of breast   10/09/2016 Surgery    Left lumpectomy: DCIS with calcifications and focal necrosis, grade 2, margins negative, 0/1 lymph node negative, ER 100%, PR 95%, Tis N0 stage 0      11/27/2016 - 12/26/2016 Radiation Therapy    Adjuvant radiation       CHIEF COMPLIANT: Follow-up on adjuvant radiation  INTERVAL HISTORY: Christina Hardin is a 66 year old with above-mentioned history of left breast DCIS treated with lumpectomy and will complete adjuvant radiation therapy and 12/27/2016. She has tolerated radiation fairly well. She does have some discomfort in the breast for which she was started on gabapentin. She is here today to discuss adjuvant treatment options.  REVIEW OF SYSTEMS:   Constitutional: Denies fevers, chills or abnormal weight loss Eyes: Denies blurriness of vision Ears, nose, mouth, throat, and face: Denies mucositis or sore throat Respiratory: Denies cough, dyspnea or wheezes Cardiovascular: Denies palpitation, chest discomfort Gastrointestinal:  Denies nausea, heartburn or change in bowel habits Skin: Denies abnormal skin rashes Lymphatics: Denies new lymphadenopathy or easy bruising Neurological:Denies numbness, tingling or new weaknesses Behavioral/Psych: Mood is stable, no new changes  Extremities: No lower extremity edema Breast: Radiation dermatitis All other systems were reviewed with the patient and are negative.  I have reviewed the past medical history, past surgical history, social history and family history with the patient and they are unchanged from previous note.  ALLERGIES:  is allergic to dilaudid [hydromorphone hcl]; hydrochlorothiazide; and propoxyphene hcl.  MEDICATIONS:  Current Outpatient Prescriptions    Medication Sig Dispense Refill  . Acetaminophen (TYLENOL ARTHRITIS PAIN PO) Take by mouth.    . Cyanocobalamin (VITAMIN B-12) 2500 MCG SUBL Take 1 tablet by mouth daily. Reported on 06/08/2016    . diphenhydramine-acetaminophen (TYLENOL PM) 25-500 MG TABS tablet Take 1 tablet by mouth.    [name] 5 MG TABS tablet TAKE 1 TABLET BY MOUTH 2 TIMES DAILY. 60 tablet 3  . famotidine (PEPCID) 40 MG tablet Take 1 tablet (40 mg total) by mouth daily. 90 tablet 3  . gabapentin (NEURONTIN) 100 MG capsule Take 100 mg by mouth 3 (three) times daily.   3  . hyaluronate sodium (RADIAPLEXRX) GEL Apply 1 application topically 2 (two) times daily.    . metoprolol succinate (TOPROL-XL) 25 MG 24 hr tablet Take 1 tablet (25 mg total) by mouth daily. 90 tablet 3  . Multiple Vitamin (MULTIVITAMIN) capsule Take 1 capsule by mouth daily. Reported on 06/08/2016    . tamoxifen (NOLVADEX) 20 MG tablet Take 1 tablet (20 mg total) by mouth daily. 90 tablet 3   No current facility-administered medications for this visit.     PHYSICAL EXAMINATION: ECOG PERFORMANCE STATUS: 1 - Symptomatic but completely ambulatory  Vitals:   12/26/16 1414  BP: 118/83  Pulse: 98  Resp: 18  Temp: 98.1 F (36.7 C)   Filed Weights   12/26/16 1414  Weight: 191 lb (86.6 kg)    GENERAL:alert, no distress and comfortable SKIN: skin color, texture, turgor are normal, no rashes or significant lesions EYES: normal, Conjunctiva are pink and non-injected, sclera clear OROPHARYNX:no exudate, no erythema and lips, buccal mucosa, and tongue normal  NECK: supple, thyroid normal size, non-tender, without nodularity LYMPH:  no palpable lymphadenopathy  in the cervical, axillary or inguinal LUNGS: clear to auscultation and percussion with normal breathing effort HEART: regular rate & rhythm and no murmurs and no lower extremity edema ABDOMEN:abdomen soft, non-tender and normal bowel sounds MUSCULOSKELETAL:no cyanosis of digits and no clubbing   NEURO: alert & oriented x 3 with fluent speech, no focal motor/sensory deficits EXTREMITIES: No lower extremity edema  LABORATORY DATA:  I have reviewed the data as listed   Chemistry      Component Value Date/Time   NA 140 10/02/2016 1501   K 4.0 10/02/2016 1501   CL 104 10/02/2016 1501   CO2 27 10/02/2016 1501   BUN 28 (H) 10/02/2016 1501   CREATININE 0.84 10/02/2016 1501   CREATININE 0.98 05/24/2016 1105      Component Value Date/Time   CALCIUM 10.4 (H) 10/02/2016 1501   ALKPHOS 72 05/24/2016 1105   AST 15 05/24/2016 1105   ALT 7 05/24/2016 1105   BILITOT 0.4 05/24/2016 1105       Lab Results  Component Value Date   WBC 8.8 08/09/2016   HGB 11.1 (L) 10/09/2016   HCT 36.6 08/09/2016   MCV 81.0 08/09/2016   PLT 249 08/09/2016   NEUTROABS 2.4 08/15/2008    ASSESSMENT & PLAN:  DCIS (ductal carcinoma in situ) of breast 10/09/2016: Left lumpectomy: DCIS with calcifications and focal necrosis, grade 2, margins negative, 0/1 lymph node negative, ER 100%, PR 95%, Tis N0 stage 0 Left breast MRI 09/17/2016: 2.6 x 4.2 x 1.4 cm area of enhancement at left breast 2:00 position  Adj XRT: 11/27/16 to 12/25/16 Plan: Start Tamoxifen in [facility] February 2018 Tamoxifen counseling:We discussed the risks and benefits of tamoxifen. These include but not limited to insomnia, hot flashes, mood changes, vaginal dryness, and weight gain. Although rare, serious side effects including  risk of blood clots were also discussed. We strongly believe that the benefits far outweigh the risks. Patient understands these risks and consented to starting treatment. Planned treatment duration is 5 years.   RTC in 3 months after starting tamoxifen around mid May 2018  I spent 25 minutes talking to the patient of which more than half was spent in counseling and coordination of care.  No orders of the defined types were placed in this encounter.  The patient has a good understanding of the overall plan. she  agrees with it. she will call with any problems that may develop before the next visit here.   Rulon Eisenmenger, MD 12/26/16

## 2016-12-27 ENCOUNTER — Telehealth: Payer: Self-pay | Admitting: *Deleted

## 2016-12-27 ENCOUNTER — Ambulatory Visit
Admission: RE | Admit: 2016-12-27 | Discharge: 2016-12-27 | Disposition: A | Discharge status: Home / Self Care | Payer: Medicare Other | Source: Ambulatory Visit | Attending: Radiation Oncology | Admitting: Radiation Oncology

## 2016-12-27 DIAGNOSIS — Z510 Encounter for antineoplastic radiation therapy: Secondary | ICD-10-CM | POA: Diagnosis not present

## 2016-12-27 NOTE — Telephone Encounter (Signed)
Called pt to congratulate on completion of xrt. Denies questions or concerns at this time. Discussed survivorship program. Encourage pt to call with needs. Received verbal understanding.

## 2016-12-31 ENCOUNTER — Encounter: Payer: Self-pay | Admitting: Radiation Oncology

## 2016-12-31 NOTE — Progress Notes (Signed)
  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  Beams/energy:   Left Breast : 3D  //  10X        Boost : Isodose Plan  //  10X  Narrative: The patient tolerated radiation treatment relatively well. The patient reported some fatigue during treatment. She experienced some constipation, which was relieved without intervention.  Plan: The patient has completed radiation treatment. The patient will return to radiation oncology clinic for routine followup in one month. I advised them to call or return sooner if they have any questions or concerns related to their recovery or treatment.  ------------------------------------------------  Jodelle Gross, MD, PhD  This document serves as a record of services personally performed by Kyung Rudd, MD. It was created on his behalf by Maryla Morrow, a trained medical scribe. The creation of this record is based on the scribe's personal observations and the provider's statements to them. This document has been checked and approved by the attending provider.

## 2017-01-07 ENCOUNTER — Other Ambulatory Visit: Payer: Self-pay | Admitting: Family Medicine

## 2017-01-14 ENCOUNTER — Encounter (HOSPITAL_COMMUNITY): Payer: Self-pay

## 2017-01-31 ENCOUNTER — Ambulatory Visit: Payer: Medicare Other | Admitting: Internal Medicine

## 2017-02-07 ENCOUNTER — Telehealth: Payer: Self-pay

## 2017-02-07 ENCOUNTER — Ambulatory Visit: Payer: Medicare Other | Admitting: Family Medicine

## 2017-02-07 NOTE — Telephone Encounter (Addendum)
Called pt to discuss about her symptoms with tamoxifen side effects. No answer lvm with call back number  1120AM- Spoke with pt and explained to pt that she needs to be seen by her PCP right away to have someone evaluate her breakouts. Pt states that it has been breaking out for 1-2 weeks medium to large bumps. Pt states that she has breakouts on her forehead, cheeks, and chin area. Some are oozing with some pus and some are crusty. Pt denies any green/ yellow pus at this time. Pt had been using an over the counter abx cream for 1 week and feels that it hasn't worked. Told pt to stop tamoxifen for 2 weeks to see if she improves. Pt denies fever, chills, sob, cp. Advised pt to wash with gentle cleanser and use a hypoallergenic moisturizer. Pt states that she tries to be very diligent with facial hygiene. She will call her pcp today and set up an appt right away. Pt to call back to update with results. Pt verbalized understanding.

## 2017-02-07 NOTE — Telephone Encounter (Signed)
Pt called stating she had a break out on the skin of her face since she started taking tamoxifen. It is a rash and can almost look infected. Prickly feeling. Can be kind of crusty, not yellow or green.  She is asking that an rx be sent to [facility] [place] phone  [phone] [place]. She has tried antibiotic cream since 7 or 8 days ago and that is not helping.  This RN instructed to hold the tamoxifen.

## 2017-02-07 NOTE — Telephone Encounter (Signed)
Called pt back and left voicemail to discuss her symptoms with tamoxifen side effects. Discussed with Dr.Gudena and wants her to seek advice and appointment with her PCP prior to prescribing any other medications at this time. Stop tamoxifen for 2 weeks and see if her symptoms improve. If it does, pt will need to schedule f/u with Dr.Gudena to change medication.

## 2017-02-14 ENCOUNTER — Encounter: Payer: Self-pay | Admitting: Family Medicine

## 2017-02-14 ENCOUNTER — Other Ambulatory Visit: Payer: Self-pay | Admitting: Family Medicine

## 2017-02-14 ENCOUNTER — Ambulatory Visit (INDEPENDENT_AMBULATORY_CARE_PROVIDER_SITE_OTHER): Payer: Medicare Other | Admitting: Family Medicine

## 2017-02-14 DIAGNOSIS — L719 Rosacea, unspecified: Secondary | ICD-10-CM | POA: Diagnosis not present

## 2017-02-14 DIAGNOSIS — I10 Essential (primary) hypertension: Secondary | ICD-10-CM | POA: Diagnosis not present

## 2017-02-14 DIAGNOSIS — I480 Paroxysmal atrial fibrillation: Secondary | ICD-10-CM | POA: Diagnosis not present

## 2017-02-14 MED ORDER — HYDROCHLOROTHIAZIDE 12.5 MG PO TABS: 12.5000 mg | ORAL_TABLET | Freq: Every day | ORAL | 3 refills | Status: DC

## 2017-02-14 MED ORDER — POTASSIUM CHLORIDE ER 10 MEQ PO TBCR: 10.0000 meq | EXTENDED_RELEASE_TABLET | Freq: Every day | ORAL | 3 refills | Status: DC

## 2017-02-14 NOTE — Patient Instructions (Addendum)
I will send a note to Dr. Lindi Adie.   I will call with the lab results I restarted the HCTZ and potassium Let me know if any light headed spells. Yes, I think the rash was from the tamoxefen.   See me in six months if things are going well.

## 2017-02-15 ENCOUNTER — Telehealth: Payer: Self-pay | Admitting: Hematology and Oncology

## 2017-02-15 ENCOUNTER — Other Ambulatory Visit: Payer: Self-pay | Admitting: Hematology and Oncology

## 2017-02-15 DIAGNOSIS — Z780 Asymptomatic menopausal state: Secondary | ICD-10-CM

## 2017-02-15 LAB — BASIC METABOLIC PANEL
BUN / CREAT RATIO: 32 — AB (ref 12–28)
BUN: 22 mg/dL (ref 8–27)
CALCIUM: 9.8 mg/dL (ref 8.7–10.3)
CHLORIDE: 104 mmol/L (ref 96–106)
CO2: 23 mmol/L (ref 18–29)
Creatinine, Ser: 0.69 mg/dL (ref 0.57–1.00)
GFR calc non Af Amer: 92 mL/min/{1.73_m2} (ref >59)
GFR, EST AFRICAN AMERICAN: 106 mL/min/{1.73_m2} (ref >59)
GLUCOSE: 80 mg/dL (ref 65–99)
Potassium: 3.6 mmol/L (ref 3.5–5.2)
Sodium: 144 mmol/L (ref 134–144)

## 2017-02-15 NOTE — Assessment & Plan Note (Signed)
Stable on current meds 

## 2017-02-15 NOTE — Telephone Encounter (Signed)
I got the message from the patient's primary care physician that the she was having rash related to tamoxifen. I agreed that she needs to discontinue tamoxifen. I recommended that she start treatment with anastrozole 1 mg daily. After counseling her regarding the risks and benefits of anastrozole patient was concerned about the risk of osteoporosis. We decided to obtain a bone density test in the next week and then determine if she can safely take anastrozole. If her bone density shows a T score of -2 or below I think she can tolerate anastrozole. She will call me after the bone density test to discuss the results and to consider initiating therapy with anastrozole.

## 2017-02-15 NOTE — Assessment & Plan Note (Signed)
Tamoxifen triggering rosacea flair or tamoxifen rash.  Either way, tamoxifen is implicated.  No intervention for now except continue to hold tamoxifen.  She could go back on since this is not an allergic reaction.  Will send not to Onc about options.

## 2017-02-15 NOTE — Progress Notes (Signed)
   Subjective:    Patient ID: Christina Hardin, female    DOB: 03/19/1951, 66 y.o.   MRN: [id number]  HPI Main issue is facial rash. 1. Developed rash shortly after starting tamoxifen.  Hx of rosacia.  Patient only had on face and not in other sun exposed areas.  Very little sun anyway.  No fever.  No sx to suggest recent infectious illness.  Stopped tamoxifen on her own and rash is dramatically improving. 2. Some home BP readings.  She would like to go back on low dose HCTZ.  Was stopped due to concern it might contribute to some lightheadedness - especially with her atrial flutter.  No recent palpitations.   3.  A fib metoprolol for rate control and eliquis as anticoag.  Stable, no palpitations.    Review of Systems     Objective:   Physical Exam Rash is consistent with rosacea in appearance and distribution. (states rash was much worse earlier.) BP normal here Lungs clear Cardiac RRR without m or g        Assessment & Plan:

## 2017-02-15 NOTE — Assessment & Plan Note (Signed)
Restart HCTZ based on home BPs  Watch for any lightheadedness.

## 2017-02-19 ENCOUNTER — Other Ambulatory Visit: Payer: Self-pay

## 2017-02-19 ENCOUNTER — Ambulatory Visit: Payer: Medicare Other | Admitting: Internal Medicine

## 2017-02-19 ENCOUNTER — Telehealth: Payer: Self-pay | Admitting: *Deleted

## 2017-02-19 ENCOUNTER — Telehealth: Payer: Self-pay | Admitting: Hematology and Oncology

## 2017-02-19 DIAGNOSIS — M8580 Other specified disorders of bone density and structure, unspecified site: Secondary | ICD-10-CM

## 2017-02-19 NOTE — Telephone Encounter (Signed)
Changed order in epic with osteopenia as diagnosis.

## 2017-02-19 NOTE — Telephone Encounter (Signed)
Per schedule message from MD - called [facility] about Bone Density. Patient is on the work que per [employer], they will contact the patient with scheduled appt.

## 2017-02-19 NOTE — Telephone Encounter (Signed)
"  Debroah Loop with [facility] calling to let Dr. Lindi Adie know patient's insurance does not cover post menopausal diagnosis for bone density.  Diagnosis like Vertebral fractures, Estrogen deficiency, osteopenia M85.80, osteoporosis, not for screening but if already diagnosed."  Will notify provider of this request.

## 2017-04-02 ENCOUNTER — Encounter: Payer: Self-pay | Admitting: Internal Medicine

## 2017-04-02 ENCOUNTER — Ambulatory Visit (INDEPENDENT_AMBULATORY_CARE_PROVIDER_SITE_OTHER): Payer: Medicare Other | Admitting: Internal Medicine

## 2017-04-02 VITALS — BP 129/85 | HR 58 | Ht 64.5 in | Wt 183.2 lb

## 2017-04-02 DIAGNOSIS — I4892 Unspecified atrial flutter: Secondary | ICD-10-CM

## 2017-04-02 DIAGNOSIS — I10 Essential (primary) hypertension: Secondary | ICD-10-CM

## 2017-04-02 DIAGNOSIS — I712 Thoracic aortic aneurysm, without rupture, unspecified: Secondary | ICD-10-CM

## 2017-04-02 NOTE — Progress Notes (Signed)
OFFICE NOTE  Chief Complaint:  Follow-up  Primary Care Physician: Zigmund Gottron, MD  HPI:  Christina Hardin is a 66 y.o. female who is coming referred to me by Dr. Andria Frames for evaluation of presyncope, dyspnea and exertion and fatigue. Recently she was on an airline flight and had an episode where she felt hot and flushed and like she was going to pass out on the airplane. They laid her down and a physician on the flight treated her with oxygen and elevating her feet. Ultimately her symptoms improve. Since that time she's had a couple of other episodes of presyncope, and feeling that she was short of breath and fatigue. She reportedly had 2 episodes yesterday. She may report some racing of her heart during these episodes. She denies any chest pain. She does feel like sometimes she has to take a deep breath. She's also been more fatigued than usual. She says she has a history of very poor sleep and only gets 3-4 hours of sleep at night. In the past she went for sleep study but did not fall sleep long enough for a good reading. She apparently also has mild RA but is only on meloxicam. Because of her recent plane flight a CT scan was ordered to rule out pulmonary embolus. 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 Family history is not significant for coronary disease however her mother does have atrial fibrillation.  07/06/2016  [name] returns today for follow-up. We placed her on a monitor which fairly quickly demonstrated atrial flutter. This is paroxysmal and she is quite symptomatic. I think the episodes where she was presyncopal or likely related to this. Her echo actually shows normal systolic function but grade 2 diastolic dysfunction. This makes sense. Since she is dependent on atrial cake for LV filling, when she goes into atrial flutter this is likely causing a marked decrease in cardiac output which causes her to be presyncopal.  She was previously on a diuretic which probably exacerbated the problem and fortunate Dr. Andria Frames stop this medication. I have started her on metoprolol with some improvement in her symptoms. She still notes some episodes of palpitations however they're less frequent and less severe. She's had no further presyncope. Blood pressure is actually elevated today at 140/98. Dr. Andria Frames also checked blood work yesterday for anemia and it appears that she is slightly more anemic despite being started on iron. This may be an effect of adding Eliquis that has unmasked some slow GI bleeding source.  04/02/2017  [name] returns for follow-up. Unfortunately over the last year she was diagnosed with breast cancer, namely DCIS. She underwent lumpectomy and radiation treatments. She is cancer free at this time. Her hemoglobin is remained stable. She is on Eliquis. She denies any recurrent atrial flutter. She was referred to see Dr. Cyndia Bent in November for her ascending aortic aneurysm. He recommended repeat imaging this summer and she will follow-up with him. Blood pressure appears well controlled on Toprol-XL 25 mg daily.  PMHx:  Past Medical History:  Diagnosis Date  . A-fib ([place])   . Anemia   . Anxiety   . Breast cancer ([place]) 09/05/2016   left breast dcis  . Chronic kidney disease    hx kidney stone  . Ductal carcinoma in situ (DCIS) of left breast   . Family history of breast cancer   . Gastric bypass status for obesity   . GERD (gastroesophageal reflux  disease)   . Hypertension   . Rheumatoid arthritis ([place])   . Thoracic aortic aneurysm ([place])   . Thoracic aortic aneurysm [facility])     Past Surgical History:  Procedure Laterality Date  . ABDOMINAL HYSTERECTOMY    . BREAST BIOPSY Left   . BREAST LUMPECTOMY WITH RADIOACTIVE SEED AND SENTINEL LYMPH NODE BIOPSY Left 10/09/2016   Procedure: BREAST LUMPECTOMY WITH RADIOACTIVE SEED AND SENTINEL LYMPH NODE BIOPSY;  Surgeon: Donnie Mesa, MD;  Location: [facility];  Service: General;  Laterality: Left;  . COLONOSCOPY W/ POLYPECTOMY    . GASTRIC BYPASS    . HYSTERECTOMY ABDOMINAL WITH SALPINGECTOMY      FAMHx:  Family History  Problem Relation Age of Onset  . Atrial fibrillation Mother   . Dementia Mother   . Hypertension Mother   . Gout Mother   . Arthritis Mother   . Stroke Mother   . COPD Father   . Emphysema Father   . Lung cancer Father   . Melanoma Father   . Hypertension Sister   . Sleep apnea Sister   . Healthy Brother   . Healthy Brother   . Heart attack Maternal Aunt   . Breast cancer Maternal Aunt     dx over 12  . Heart attack Maternal Uncle   . Prostate cancer Maternal Uncle     unsure if this was truly prostate cancer  . Heart attack Paternal Uncle   . Heart attack Maternal Grandfather   . Breast cancer Cousin     mat first cousin  . Breast cancer Cousin     mat first cousin  . Cancer Cousin     father's maternal first cousin with Breast, Ovarian, Uterine Cancer  . Leukemia Other     father's maternal uncle  . Leukemia Other     father's maternal uncle  . Leukemia Cousin     3 of father's maternal first cousin    SOCHx:   reports that she has never smoked. She has never used smokeless tobacco. She reports that she does not drink alcohol or use drugs.  ALLERGIES:  Allergies  Allergen Reactions  . Dilaudid [Hydromorphone Hcl] Shortness Of Breath  . Propoxyphene Hcl     REACTION: unspecified    ROS: Pertinent items noted in HPI and remainder of comprehensive ROS otherwise negative.  HOME MEDS: Current Outpatient Prescriptions  Medication Sig Dispense Refill  . Acetaminophen (TYLENOL ARTHRITIS PAIN PO) Take by mouth.    . Cyanocobalamin (VITAMIN B-12) 2500 MCG SUBL Take 1 tablet by mouth daily. Reported on 06/08/2016    . diphenhydramine-acetaminophen (TYLENOL PM) 25-500 MG TABS tablet Take 1 tablet by mouth.    [name] 5 MG TABS tablet TAKE 1 TABLET BY MOUTH 2 TIMES DAILY. 60 tablet  3  . famotidine (PEPCID) 40 MG tablet Take 1 tablet (40 mg total) by mouth daily. 90 tablet 3  . gabapentin (NEURONTIN) 100 MG capsule Take 100 mg by mouth 3 (three) times daily.   3  . hyaluronate sodium (RADIAPLEXRX) GEL Apply 1 application topically 2 (two) times daily.    . hydrochlorothiazide (HYDRODIURIL) 12.5 MG tablet Take 1 tablet (12.5 mg total) by mouth daily. 90 tablet 3  . metoprolol succinate (TOPROL-XL) 25 MG 24 hr tablet Take 1 tablet (25 mg total) by mouth daily. 90 tablet 3  . Multiple Vitamin (MULTIVITAMIN) capsule Take 1 capsule by mouth daily. Reported on 06/08/2016    . potassium chloride (K-DUR) 10  MEQ tablet Take 1 tablet (10 mEq total) by mouth daily. 90 tablet 3   No current facility-administered medications for this visit.     LABS/IMAGING: No results found for this or any previous visit (from the past 48 hour(s)). No results found.  WEIGHTS: Wt Readings from Last 3 Encounters:  04/02/17 183 lb 3.2 oz (83.1 kg)  02/14/17 194 lb 3.2 oz (88.1 kg)  12/26/16 191 lb (86.6 kg)    VITALS: BP 129/85   Pulse (!) 58   Ht 5' 4.5" (1.638 m)   Wt 183 lb 3.2 oz (83.1 kg)   BMI 30.96 kg/m   EXAM: General appearance: alert and no distress Lungs: clear to auscultation bilaterally Heart: regular rate and rhythm, S1, S2 normal, no murmur, click, rub or gallop Extremities: extremities normal, atraumatic, no cyanosis or edema Neurologic: Grossly normal  EKG: Sinus bradycardia with sinus arrhythmia at 58  ASSESSMENT: 1. Presyncope - related to paroxysmal atrial flutter 2. Normal LVEF with grade 2 DD, normal LA size 3. Dyspnea on exertion - related to diastolic dysfunction 4. Hypertension 5. Thoracic aortic aneurysm (4.5 cm - 2017) 6. Paroxysmal atrial flutter - CHADSVASC score of 4 7. Recent DCIS s/p lumpectomy and radiation  PLAN: 1.   [name] returns today and seems to be doing well. Unfortunately she had recent breast cancer status post lumpectomy and  radiation therapy. She underwent GI evaluation but did not have endoscopy or colonoscopy due to her aneurysm. She did have a negative stool car. Hemoglobins been stable on Eliquis. She saw Dr. Cyndia Bent and will have follow-up this summer with a repeat CT scan to follow her ascending aortic aneurysm.   Follow-up with me annually or sooner as necessary.  Pixie Casino, MD, [facility] Attending Cardiologist [place] 04/02/2017, 10:41 AM

## 2017-04-02 NOTE — Patient Instructions (Signed)
Your physician wants you to follow-up in: ONE YEAR with Dr. Hilty. You will receive a reminder letter in the mail two months in advance. If you don't receive a letter, please call our office to schedule the follow-up appointment.  

## 2017-04-11 ENCOUNTER — Encounter: Payer: Self-pay | Admitting: Family Medicine

## 2017-04-11 ENCOUNTER — Ambulatory Visit (INDEPENDENT_AMBULATORY_CARE_PROVIDER_SITE_OTHER): Payer: Medicare Other | Admitting: Family Medicine

## 2017-04-11 VITALS — BP 132/81 | HR 57 | Temp 98.1°F | Wt 182.0 lb

## 2017-04-11 DIAGNOSIS — L719 Rosacea, unspecified: Secondary | ICD-10-CM | POA: Diagnosis not present

## 2017-04-11 DIAGNOSIS — R21 Rash and other nonspecific skin eruption: Secondary | ICD-10-CM | POA: Diagnosis not present

## 2017-04-11 MED ORDER — METRONIDAZOLE 1 % EX GEL: Freq: Every day | CUTANEOUS | 0 refills | Status: DC

## 2017-04-11 MED ORDER — DOXYCYCLINE HYCLATE 50 MG PO CAPS: 50.0000 mg | ORAL_CAPSULE | Freq: Two times a day (BID) | ORAL | 0 refills | Status: DC

## 2017-04-11 NOTE — Patient Instructions (Signed)
Tamoxifen should be out of your system by now.  I do not think that your facial rash is related to this.  This may be a flare of your rosacea.  I am prescribing you a short course of oral antibiotics and a topical antibiotic.  Please follow up with Dr Andria Frames in 2 weeks or sooner if rash become worse.  Rosacea Rosacea is a long-term (chronic) condition that affects the skin of the face, including the cheeks, nose, brow, and chin. This condition can also affect the eyes. Rosacea causes blood vessels near the surface of the skin to enlarge, which results in redness. What are the causes? The cause of this condition is not known. Certain triggers can make rosacea worse, including:  Hot baths.  Exercise.  Sunlight.  Very hot or cold temperatures.  Hot or spicy foods and drinks.  Drinking alcohol.  Stress.  Taking blood pressure medicine.  Long-term use of topical steroids on the face. What increases the risk? This condition is more likely to develop in:  People who are older than 66 years of age.  Women.  People who have light-colored skin (light complexion).  People who have a family history of rosacea. What are the signs or symptoms? Symptoms of this condition include:  Redness of the face.  Red bumps or pimples on the face.  A red, enlarged nose.  Blushing easily.  Red lines on the skin.  Irritated or burning feeling in the eyes.  Swollen eyelids. How is this diagnosed? This condition is diagnosed with a medical history and physical exam. How is this treated? There is no cure for this condition, but treatment can help to control your symptoms. Your health care provider may recommend that you see a skin specialist (dermatologist). Treatment may include:  Antibiotic medicines that are applied to the skin or taken as a pill.  Laser treatment to improve the appearance of the skin.  Surgery. This is rare. Your health care provider will also recommend the best way  to take care of your skin. Even after your skin improves, you will likely need to continue treatment to prevent your rosacea from coming back. Follow these instructions at home: Skin Care  Take care of your skin as told by your health care provider. You may be told to do these things:  Wash your skin gently two or more times each day.  Use mild soap.  Use a sunscreen or sunblock with SPF 30 or greater.  Use gentle cosmetics that are meant for sensitive skin.  Shave with an electric shaver instead of a blade. Lifestyle   Try to keep track of what foods trigger this condition. Avoid any triggers. These may include:  Spicy foods.  Seafood.  Cheese.  Hot liquids.  Nuts.  Chocolate.  Iodized salt.  Do not drink alcohol.  Avoid extremely cold or hot temperatures.  Try to reduce your stress. If you need help, talk with your health care provider.  When you exercise, do these things to stay cool:  Limit your sun exposure.  Use a fan.  Do shorter and more frequent intervals of exercise. General instructions   Keep all follow-up visits as told by your health care provider. This is important.  Take over-the-counter and prescription medicines only as told by your health care provider.  If your eyelids are affected, apply warm compresses to them. Do this as told by your health care provider.  If you were prescribed an antibiotic medicine, apply or take it  as told by your health care provider. Do not stop using the antibiotic even if your condition improves. Contact a health care provider if:  Your symptoms get worse.  Your symptoms do not improve after two months of treatment.  You have new symptoms.  You have any changes in vision or you have problems with your eyes, such as redness or itching.  You feel depressed.  You lose your appetite.  You have trouble concentrating. This information is not intended to replace advice given to you by your health care  provider. Make sure you discuss any questions you have with your health care provider. Document Released: 12/27/2004 Document Revised: 04/26/2016 Document Reviewed: 01/26/2015 Elsevier Interactive Patient Education  2017 [employer].

## 2017-04-11 NOTE — Progress Notes (Signed)
   Subjective: CC: facial rash [id number] Christina Hardin Christina Hardin is a 66 y.o. female presenting to clinic today for same day appointment. PCP: Zenia Resides, MD Concerns today include:  1. Facial rash Patient reports reddish facial rash with acne like lesions.  She reports she saw her PCP for this a couple of months ago and it was thought that her Tamoxifen may be the cause.  She notes she discontinued medication and rash resolved.  However, about 1-2 weeks ago rash reappeared.  She notes she had not resumed the medication.  She reports increased stress.  Denies new soaps, lotions, pets.  She has been using regular soap and water to wash facial, noting she has not special face regimen.  She reports itching at rash site with occasional tingling of the area even when rash is not present.  She notes rash actually appears a bit better today compared to yesterday.  Denies SOB, wheeze, nausea, vomiting, rash elsewhere on the body.  Additionally, she reports h/o rosacea that she has not needed treatment for in some time.  Allergies  Allergen Reactions  . Dilaudid [Hydromorphone Hcl] Shortness Of Breath  . Propoxyphene Hcl     REACTION: unspecified   Social Hx reviewed: non smoker. MedHx, current medications and allergies reviewed.  Please see EMR. ROS: Per HPI  Objective: Office vital signs reviewed. BP 132/81   Pulse (!) 57   Temp 98.1 F (36.7 C) (Oral)   Wt 182 lb (82.6 kg)   SpO2 97%   BMI 30.76 kg/m   Physical Examination:  General: Awake, alert, well nourished, nontoxic appearing female, No acute distress HEENT: see skin below for facial rash    Neck: No masses palpated. No lymphadenopathy    Eyes: EOMI, sclera white    Throat: moist mucus membranes Skin: dry; slight erythema of skin near the nasal bridge, several blanching, erythematous open comedones along forehead and bilateral cheeks.  No purulence, exudate or bleeding from lesions.  No skin lesions elsewhere.  Assessment/ Plan: 66  y.o. female   1. Facial rash.  She has not had tamoxifen in >1 month.  I discussed her case with pharmacy and indeed her symptoms should not be related to Tamoxifen this far out.  There is no obvious contact that she can identify.  There are no lesions elsewhere on her skin.  This may be a flare of her rosacea, albeit somewhat of an unusual presentation.  I am going to cover her with a short course of abx and metrogel.  We discussed reasons for return and reasons to seek emergent care in ED. - doxycycline (VIBRAMYCIN) 50 MG capsule; Take 1 capsule (50 mg total) by mouth 2 (two) times daily.  Dispense: 20 capsule; Refill: 0 - metroNIDAZOLE (METROGEL) 1 % gel; Apply topically daily.  Dispense: 45 g; Refill: 0  2. Rosacea - doxycycline (VIBRAMYCIN) 50 MG capsule; Take 1 capsule (50 mg total) by mouth 2 (two) times daily.  Dispense: 20 capsule; Refill: 0 - metroNIDAZOLE (METROGEL) 1 % gel; Apply topically daily.  Dispense: 45 g; Refill: 0  Follow up in 2 weeks with Dr Andria Frames for reevaluation or sooner if needed.   Christina Norlander, DO PGY-3, [facility]

## 2017-04-24 ENCOUNTER — Other Ambulatory Visit: Payer: Self-pay | Admitting: Surgery

## 2017-04-24 DIAGNOSIS — Z8679 Personal history of other diseases of the circulatory system: Secondary | ICD-10-CM

## 2017-04-24 DIAGNOSIS — Z9889 Other specified postprocedural states: Principal | ICD-10-CM

## 2017-04-24 NOTE — Assessment & Plan Note (Signed)
10/09/2016: Left lumpectomy: DCIS with calcifications and focal necrosis, grade 2, margins negative, 0/1 lymph node negative, ER 100%, PR 95%, Tis N0 stage 0 Left breast MRI 09/17/2016: 2.6 x 4.2 x 1.4 cm area of enhancement at left breast 2:00 position  Adj XRT: 11/27/16 to 12/25/16 Plan: Staredt Tamoxifen 01/09/17 stopped March 2018 for rash Started Anastrozole 1 mg daily March 2018  RTC in 3 months

## 2017-04-25 ENCOUNTER — Ambulatory Visit (HOSPITAL_BASED_OUTPATIENT_CLINIC_OR_DEPARTMENT_OTHER): Payer: Medicare Other | Admitting: Hematology and Oncology

## 2017-04-25 ENCOUNTER — Other Ambulatory Visit: Payer: Self-pay | Admitting: Surgery

## 2017-04-25 ENCOUNTER — Encounter: Payer: Self-pay | Admitting: Hematology and Oncology

## 2017-04-25 VITALS — BP 150/84 | HR 55 | Temp 98.2°F | Resp 18 | Ht 64.5 in | Wt 185.6 lb

## 2017-04-25 DIAGNOSIS — N644 Mastodynia: Secondary | ICD-10-CM

## 2017-04-25 DIAGNOSIS — Z780 Asymptomatic menopausal state: Secondary | ICD-10-CM

## 2017-04-25 DIAGNOSIS — D0512 Intraductal carcinoma in situ of left breast: Secondary | ICD-10-CM

## 2017-04-25 MED ORDER — ANASTROZOLE 1 MG PO TABS: 1.0000 mg | ORAL_TABLET | Freq: Every day | ORAL | 0 refills | Status: DC

## 2017-04-25 NOTE — Progress Notes (Signed)
Patient Care Team: Zenia Resides, MD as PCP - General  DIAGNOSIS:  Encounter Diagnosis  Name Primary?  . Ductal carcinoma in situ (DCIS) of left breast     SUMMARY OF ONCOLOGIC HISTORY:   DCIS (ductal carcinoma in situ) of breast   10/09/2016 Surgery    Left lumpectomy: DCIS with calcifications and focal necrosis, grade 2, margins negative, 0/1 lymph node negative, ER 100%, PR 95%, Tis N0 stage 0      11/27/2016 - 12/26/2016 Radiation Therapy    Adjuvant radiation       CHIEF COMPLIANT: Follow-up after radiation therapy  INTERVAL HISTORY: Christina Hardin is a 66 year old underwent left lumpectomy for DCIS and today completes her adjuvant radiation therapy. She tolerated radiation extremely well with some discomfort especially treatment-related skin changes. Denies any pain or discomfort.  REVIEW OF SYSTEMS:   Constitutional: Denies fevers, chills or abnormal weight loss Eyes: Denies blurriness of vision Ears, nose, mouth, throat, and face: Denies mucositis or sore throat Respiratory: Denies cough, dyspnea or wheezes Cardiovascular: Denies palpitation, chest discomfort Gastrointestinal:  Denies nausea, heartburn or change in bowel habits Skin: Denies abnormal skin rashes Lymphatics: Denies new lymphadenopathy or easy bruising Neurological:Denies numbness, tingling or new weaknesses Behavioral/Psych: Mood is stable, no new changes  Extremities: No lower extremity edema Breast:  Mild radiation dermatitis All other systems were reviewed with the patient and are negative.  I have reviewed the past medical history, past surgical history, social history and family history with the patient and they are unchanged from previous note.  ALLERGIES:  is allergic to dilaudid [hydromorphone hcl] and propoxyphene hcl.  MEDICATIONS:  Current Outpatient Prescriptions  Medication Sig Dispense Refill  . Acetaminophen (TYLENOL ARTHRITIS PAIN PO) Take by mouth.    . Cyanocobalamin  (VITAMIN B-12) 2500 MCG SUBL Take 1 tablet by mouth daily. Reported on 06/08/2016    . diphenhydramine-acetaminophen (TYLENOL PM) 25-500 MG TABS tablet Take 1 tablet by mouth.    . doxycycline (VIBRAMYCIN) 50 MG capsule Take 1 capsule (50 mg total) by mouth 2 (two) times daily. 20 capsule 0  . ELIQUIS 5 MG TABS tablet TAKE 1 TABLET BY MOUTH 2 TIMES DAILY. 60 tablet 3  . famotidine (PEPCID) 40 MG tablet Take 1 tablet (40 mg total) by mouth daily. 90 tablet 3  . gabapentin (NEURONTIN) 100 MG capsule Take 100 mg by mouth 3 (three) times daily.   3  . hyaluronate sodium (RADIAPLEXRX) GEL Apply 1 application topically 2 (two) times daily.    . hydrochlorothiazide (HYDRODIURIL) 12.5 MG tablet Take 1 tablet (12.5 mg total) by mouth daily. 90 tablet 3  . metoprolol succinate (TOPROL-XL) 25 MG 24 hr tablet Take 1 tablet (25 mg total) by mouth daily. 90 tablet 3  . metroNIDAZOLE (METROGEL) 1 % gel Apply topically daily. 45 g 0  . Multiple Vitamin (MULTIVITAMIN) capsule Take 1 capsule by mouth daily. Reported on 06/08/2016    . potassium chloride (K-DUR) 10 MEQ tablet Take 1 tablet (10 mEq total) by mouth daily. 90 tablet 3   No current facility-administered medications for this visit.     PHYSICAL EXAMINATION: ECOG PERFORMANCE STATUS: 1 - Symptomatic but completely ambulatory  Vitals:   04/25/17 1149  BP: (!) 150/84  Pulse: (!) 55  Resp: 18  Temp: 98.2 F (36.8 C)   Filed Weights   04/25/17 1149  Weight: 185 lb 9.6 oz (84.2 kg)    GENERAL:alert, no distress and comfortable SKIN: skin color, texture, turgor are  normal, no rashes or significant lesions EYES: normal, Conjunctiva are pink and non-injected, sclera clear OROPHARYNX:no exudate, no erythema and lips, buccal mucosa, and tongue normal  NECK: supple, thyroid normal size, non-tender, without nodularity LYMPH:  no palpable lymphadenopathy in the cervical, axillary or inguinal LUNGS: clear to auscultation and percussion with normal  breathing effort HEART: regular rate & rhythm and no murmurs and no lower extremity edema ABDOMEN:abdomen soft, non-tender and normal bowel sounds MUSCULOSKELETAL:no cyanosis of digits and no clubbing  NEURO: alert & oriented x 3 with fluent speech, no focal motor/sensory deficits EXTREMITIES: No lower extremity edema  LABORATORY DATA:  I have reviewed the data as listed   Chemistry      Component Value Date/Time   NA 144 02/14/2017 1235   K 3.6 02/14/2017 1235   CL 104 02/14/2017 1235   CO2 23 02/14/2017 1235   BUN 22 02/14/2017 1235   CREATININE 0.69 02/14/2017 1235   CREATININE 0.98 05/24/2016 1105      Component Value Date/Time   CALCIUM 9.8 02/14/2017 1235   ALKPHOS 72 05/24/2016 1105   AST 15 05/24/2016 1105   ALT 7 05/24/2016 1105   BILITOT 0.4 05/24/2016 1105       Lab Results  Component Value Date   WBC 8.8 08/09/2016   HGB 11.1 (L) 10/09/2016   HCT 36.6 08/09/2016   MCV 81.0 08/09/2016   PLT 249 08/09/2016   NEUTROABS 2.4 08/15/2008    ASSESSMENT & PLAN:  DCIS (ductal carcinoma in situ) of breast 10/09/2016: Left lumpectomy: DCIS with calcifications and focal necrosis, grade 2, margins negative, 0/1 lymph node negative, ER 100%, PR 95%, Tis N0 stage 0 Left breast MRI 09/17/2016: 2.6 x 4.2 x 1.4 cm area of enhancement at left breast 2:00 position  Adj XRT: 11/27/16 to 12/25/16 Plan: Started Tamoxifen 01/09/17 stopped March 2018 for rash Patient developed rash in spite of stopping tamoxifen. 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 She does not think the antiestrogen therapy was the cause of the rash. She actually threw the prescription bottle away. I discussed about different options and recommended anastrozole 1 mg daily. I sent a 30 day supply of this prescription. If she tolerates it well then we can send for 90 day prescription.  RTC in 3 monthsfor toxicity check and follow-up.   I spent 25 minutes talking to the patient of which  more than half was spent in counseling and coordination of care.  No orders of the defined types were placed in this encounter.  The patient has a good understanding of the overall plan. she agrees with it. she will call with any problems that may develop before the next visit here.   Rulon Eisenmenger, MD 04/25/17

## 2017-05-01 ENCOUNTER — Other Ambulatory Visit: Payer: Self-pay | Admitting: Surgery

## 2017-05-01 DIAGNOSIS — N644 Mastodynia: Secondary | ICD-10-CM

## 2017-05-07 ENCOUNTER — Ambulatory Visit
Admission: RE | Admit: 2017-05-07 | Discharge: 2017-05-07 | Disposition: A | Discharge status: Home / Self Care | Payer: Medicare Other | Source: Ambulatory Visit | Attending: Surgery | Admitting: Surgery

## 2017-05-07 DIAGNOSIS — N644 Mastodynia: Secondary | ICD-10-CM

## 2017-05-07 NOTE — Progress Notes (Signed)
Please call the patient and let them know that mammogram and ultrasound were normal in appearance.  I can see her as originally scheduled

## 2017-05-21 DIAGNOSIS — Z853 Personal history of malignant neoplasm of breast: Secondary | ICD-10-CM | POA: Insufficient documentation

## 2017-05-27 ENCOUNTER — Other Ambulatory Visit: Payer: Self-pay | Admitting: Internal Medicine

## 2017-05-29 ENCOUNTER — Encounter: Payer: Self-pay | Admitting: Surgery

## 2017-05-29 ENCOUNTER — Ambulatory Visit
Admission: RE | Admit: 2017-05-29 | Discharge: 2017-05-29 | Disposition: A | Discharge status: Home / Self Care | Payer: Medicare Other | Source: Ambulatory Visit | Attending: Surgery | Admitting: Surgery

## 2017-05-29 ENCOUNTER — Ambulatory Visit (INDEPENDENT_AMBULATORY_CARE_PROVIDER_SITE_OTHER): Payer: Medicare Other | Admitting: Surgery

## 2017-05-29 ENCOUNTER — Other Ambulatory Visit: Payer: Self-pay | Admitting: *Deleted

## 2017-05-29 VITALS — BP 132/85 | HR 62 | Resp 20 | Ht 64.5 in | Wt 185.0 lb

## 2017-05-29 DIAGNOSIS — Z9889 Other specified postprocedural states: Principal | ICD-10-CM

## 2017-05-29 DIAGNOSIS — I712 Thoracic aortic aneurysm, without rupture: Secondary | ICD-10-CM | POA: Diagnosis not present

## 2017-05-29 DIAGNOSIS — E041 Nontoxic single thyroid nodule: Secondary | ICD-10-CM

## 2017-05-29 DIAGNOSIS — Z8679 Personal history of other diseases of the circulatory system: Secondary | ICD-10-CM

## 2017-05-29 DIAGNOSIS — I7121 Aneurysm of the ascending aorta, without rupture: Secondary | ICD-10-CM

## 2017-05-29 MED ORDER — IOPAMIDOL (ISOVUE-370) INJECTION 76%
75.0000 mL | Freq: Once | INTRAVENOUS | Status: AC | PRN
  Administered 2017-05-29: 75 mL via INTRAVENOUS

## 2017-05-29 NOTE — Progress Notes (Signed)
HPI:  The patient returns today for follow up of a 4.5 cm fusiform aortic aneurysm noted incidentally on a CTA of the chest done to rule out PE. She also was diagnosed with breast DCIS and underwent a left breast lumpectomy on 10/09/2016. She went through XRT and was started on Tamoxifen and then another chemo drug but did not tolerate either. She feels well and is planning to get married soon. She denies any chest or back pain.  Current Outpatient Prescriptions  Medication Sig Dispense Refill  . Acetaminophen (TYLENOL ARTHRITIS PAIN PO) Take by mouth.    . Cyanocobalamin (VITAMIN B-12) 2500 MCG SUBL Take 1 tablet by mouth daily. Reported on 06/08/2016    . diphenhydramine-acetaminophen (TYLENOL PM) 25-500 MG TABS tablet Take 1 tablet by mouth.    [name] 5 MG TABS tablet TAKE 1 TABLET BY MOUTH 2 TIMES DAILY. 180 tablet 1  . hydrochlorothiazide (HYDRODIURIL) 12.5 MG tablet Take 1 tablet (12.5 mg total) by mouth daily. 90 tablet 3  . metoprolol succinate (TOPROL-XL) 25 MG 24 hr tablet Take 1 tablet (25 mg total) by mouth daily. 90 tablet 3  . metroNIDAZOLE (METROGEL) 1 % gel Apply topically daily. 45 g 0  . Multiple Vitamin (MULTIVITAMIN) capsule Take 1 capsule by mouth daily. Reported on 06/08/2016     No current facility-administered medications for this visit.      Physical Exam: BP 132/85   Pulse 62   Resp 20   Ht 5' 4.5" (1.638 m)   Wt 185 lb (83.9 kg)   SpO2 97% Comment: RA  BMI 31.26 kg/m  She looks well Cardiac exam shows a regular rate and rhythm with normal heart sounds Lungs are clear  Diagnostic Tests:  CLINICAL DATA:  Ascending thoracic aortic aneurysm  EXAM: CT ANGIOGRAPHY CHEST WITH CONTRAST  TECHNIQUE: Multidetector CT imaging of the chest was performed using the standard protocol during bolus administration of intravenous contrast. Multiplanar CT image reconstructions and MIPs were obtained to evaluate the vascular anatomy.  CONTRAST:  75 cc Isovue  370  Creatinine was obtained on site at [facility] at [address]  Results: Creatinine 0.9 mg/dL.  COMPARISON:  05/24/2016  FINDINGS: Cardiovascular: Fusiform atherosclerotic aneurysm of the ascending thoracic aorta, 4.3 cm in AP dimension and 4.6 cm transverse, unchanged. No associated dissection or mediastinal hemorrhage. Two vessel arch anatomy appearing patent. Limited assessment of the pulmonary arteries. No large central PE demonstrated. Normal heart size. No pericardial effusion.  Mediastinum/Nodes: No enlarged mediastinal, hilar, or axillary lymph nodes. Trachea and esophagus demonstrate no significant findings. Suspect exophytic right inferior thyroid nodule extending substernal measures 14 mm, image 10.  Lungs/Pleura: Minor scattered patchy left upper lobe subpleural ground-glass opacities, suspect post inflammatory or possibly related to left chest / breast radiation. Minor dependent bibasilar atelectasis. No focal collapse or consolidation. No pleural abnormality, effusion or pneumothorax. Trachea central airways are patent.  Upper Abdomen: Postop changes from gastric bypass. Punctate nonobstructing right intrarenal calculi. No acute upper abdominal process.  Musculoskeletal: Postop changes of the left breast with a measurable soft tissue abnormality at lumpectomy site, 3.2 x 2.0 cm. This remains indeterminate. Diffuse skin thickening of the left breast suspect from radiation. No axillary adenopathy. No acute osseous finding. Sternum intact. No thoracic compression fracture.  Review of the MIP images confirms the above findings.  IMPRESSION: Stable ascending thoracic aortic aneurysm, maximal dimension 4.6 cm.  Recommend semi-annual imaging followup by CTA or MRA and referral to  cardiothoracic surgery if not already obtained. This recommendation follows 2010 ACCF/AHA/AATS/ACR/ASA/SCA/SCAI/SIR/STS/SVM Guidelines for the Diagnosis  and Management of Patients With Thoracic Aortic Disease. Circulation. 2010; 121: e266-e36  No other acute intrathoracic process  Presumed postoperative and post treatment changes of the left breast with a residual measurable soft tissue abnormality at the lumpectomy site remaining indeterminate by CT.  No adenopathy  Remote gastric bypass  Right inferior thyroid exophytic nodule. Recommend follow-up thyroid ultrasound non emergently.   Electronically Signed   By: Jerilynn Mages.  Shick M.D.   On: 05/29/2017 13:57   Impression:  I have personally reviewed and interpreted her CTA of the chest and compared the images to her prior CT's.  She has a 4.6 cm fusiform ascending aortic aneurysm that is essentially unchanged from the CT one year ago. This is well below the threshold for surgical treatment of 5.5 cm. Her BP is adequately controlled on a beta blocker. I reviewed the CT images with her and answered her questions. There may be a nodule on the inferior right thyroid lobe but it is not clear on CT and we will order a thyroid ultrasound as recommended by radiology to evaluate this further.  Plan:  I will see her back in one year with a CTA of the chest to follow up on her aneurysm. I will order the thyroid ultrasound and follow up on that.   I spent 15 minutes performing this established patient evaluation and > 50% of this time was spent face to face counseling and coordinating the care of this patient's aortic aneurysm.   Gaye Pollack, MD [facility] [phone]

## 2017-05-30 ENCOUNTER — Other Ambulatory Visit: Payer: Self-pay

## 2017-05-30 ENCOUNTER — Telehealth: Payer: Self-pay

## 2017-05-30 ENCOUNTER — Encounter: Payer: Medicare Other | Admitting: Adult Health

## 2017-05-30 NOTE — Telephone Encounter (Signed)
Returned pt call regarding reports of severe hot flashes, bilateral leg swelling, aches/pains, insomnia and fatigue with anastrozole. Pt states that she had stopped taking the medication a week ago and refused to take it again d/t intolerance to side effects of anastrozole. Pt states that her leg swelling had resolve and her aches/pains had slightly improved, but she still c/o hot flashes and difficulty sleeping. Told pt that it may take another week before all symptoms resolve, after being off of anastrozole. Pt had cancelled appt for survivorship this morning because she was not aware she had this appt today. She would like to reschedule in august. Rescheduled pt for survivorship appt 07/03/17. Explained to pt what she can expect during this appt. Pt verbalized understanding and will address her concerns regarding antiestrogen therapy then. In the meantime, pt would like to be off of this medication until her symptoms completely resolve. Advised pt to call back if she is interested in taking another antiestrogen earlier. Pt verbalized understanding and will think about it. She was unable to tolerate tamoxifen due to a facial rash, but was not certain it was tamoxifen that had caused this. She switched to anastrozole and took for 3 weeks, but her side effects were severe and she does not like how it is significantly affecting her quality of life.Pt will call back with any other concerns or questions before her next appt with survivorship.

## 2017-06-10 ENCOUNTER — Ambulatory Visit
Admission: RE | Admit: 2017-06-10 | Discharge: 2017-06-10 | Disposition: A | Discharge status: Home / Self Care | Payer: Medicare Other | Source: Ambulatory Visit | Attending: Surgery | Admitting: Surgery

## 2017-06-10 DIAGNOSIS — E041 Nontoxic single thyroid nodule: Secondary | ICD-10-CM

## 2017-06-19 ENCOUNTER — Encounter: Payer: Self-pay | Admitting: Family Medicine

## 2017-06-19 ENCOUNTER — Ambulatory Visit (INDEPENDENT_AMBULATORY_CARE_PROVIDER_SITE_OTHER): Payer: Medicare Other | Admitting: Family Medicine

## 2017-06-19 DIAGNOSIS — M79641 Pain in right hand: Secondary | ICD-10-CM

## 2017-06-19 DIAGNOSIS — R590 Localized enlarged lymph nodes: Secondary | ICD-10-CM

## 2017-06-19 DIAGNOSIS — M79642 Pain in left hand: Secondary | ICD-10-CM | POA: Diagnosis not present

## 2017-06-19 MED ORDER — TRAMADOL HCL 50 MG PO TABS: 50.0000 mg | ORAL_TABLET | Freq: Three times a day (TID) | ORAL | 0 refills | Status: DC | PRN

## 2017-06-19 NOTE — Patient Instructions (Signed)
The thing on CT scan is probably a lymph node.  I think it is safe to follow up with another CT in 3 months.   If the sinus drainage/scratchy throat gets worse, call me and I will get you to the ENT doctor.

## 2017-06-20 DIAGNOSIS — R590 Localized enlarged lymph nodes: Secondary | ICD-10-CM | POA: Insufficient documentation

## 2017-06-20 NOTE — Progress Notes (Signed)
   Subjective:    Patient ID: Christina Hardin, female    DOB: 01-May-1951, 66 y.o.   MRN: [id number]  HPI Two major issues: 1. Bilateral hand pain Left >Rt.  Knows she has carpal tunnel and wearing cock up splints.  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  Current pain is worse in left hand - mostly around fifth digit.  Certain positions intensify.  No trauma. 2. 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  Fortunately stable.  Unfortunately identified a 14 mm likely paratracheal/para esophageal lymph node.  (per fU thyroid ultrasound, it was not part of the thyroid.) Remote history of short term smoking.  No cough, fever or night sweats.  Does have some post nasal drip and voice can get hoarse with overuse.  Great news: Getting married. Found a "good guy." They are currently deciding between [place] and [place].    Review of Systems     Objective:   Physical ExamNeck no palpable abnormality. Lungs clear Bilateral hands + carpal tunnel testing.  Left hand described pain is actually in ulnar distribution.  My examining caused discomfort.        Assessment & Plan:

## 2017-06-20 NOTE — Assessment & Plan Note (Signed)
Suspect her hand pain is multifactoreal with a componant of carpal tunnel and osteoarthritis.  I am unclear about her ulnar distribution pain.  I am unclear whether plain films, MRI or nerve conduction studies are the best next step.  As such, I will refer to hand surg.

## 2017-06-20 NOTE — Assessment & Plan Note (Signed)
CT finding is likely lymphadenopathy.  Low risk for Cancer.  Only related symptom is the sinus drainage/intermitant hoarsenss.  Discussed options.  Will reimage in three months.  ENT referral sooner if hoarseness worsens.

## 2017-06-25 ENCOUNTER — Encounter (HOSPITAL_BASED_OUTPATIENT_CLINIC_OR_DEPARTMENT_OTHER): Payer: Self-pay

## 2017-06-25 ENCOUNTER — Ambulatory Visit (HOSPITAL_BASED_OUTPATIENT_CLINIC_OR_DEPARTMENT_OTHER): Admit: 2017-06-25 | Payer: Medicare Other | Admitting: Plastic Surgery

## 2017-06-25 SURGERY — MASTOPEXY
Anesthesia: General | Laterality: Bilateral

## 2017-07-03 ENCOUNTER — Encounter: Payer: Self-pay | Admitting: Adult Health

## 2017-07-03 ENCOUNTER — Ambulatory Visit (HOSPITAL_BASED_OUTPATIENT_CLINIC_OR_DEPARTMENT_OTHER): Payer: Medicare Other | Admitting: Adult Health

## 2017-07-03 VITALS — BP 133/76 | HR 65 | Temp 98.2°F | Resp 18 | Ht 64.5 in | Wt 180.8 lb

## 2017-07-03 DIAGNOSIS — D0512 Intraductal carcinoma in situ of left breast: Secondary | ICD-10-CM | POA: Diagnosis not present

## 2017-07-03 DIAGNOSIS — G5603 Carpal tunnel syndrome, bilateral upper limbs: Secondary | ICD-10-CM | POA: Insufficient documentation

## 2017-07-03 DIAGNOSIS — N898 Other specified noninflammatory disorders of vagina: Secondary | ICD-10-CM | POA: Diagnosis not present

## 2017-07-03 MED ORDER — TAMOXIFEN CITRATE 20 MG PO TABS: 20.0000 mg | ORAL_TABLET | Freq: Every day | ORAL | 5 refills | Status: DC

## 2017-07-03 NOTE — Progress Notes (Signed)
CLINIC:  Survivorship   REASON FOR VISIT:  Routine follow-up post-treatment for a recent history of breast cancer.  BRIEF ONCOLOGIC HISTORY:    DCIS (ductal carcinoma in situ) of breast   10/09/2016 Surgery    Left lumpectomy: DCIS with calcifications and focal necrosis, grade 2, margins negative, 0/1 lymph node negative, ER 100%, PR 95%, Tis N0 stage 0      11/27/2016 - 12/27/2016 Radiation Therapy    Adjuvant radiation (moody): Left Breast was treated to 42.5 Gy in 17 fractions, and then Boosted an additional 10 Gy in 4 fractions.      04/2017 -  Anti-estrogen oral therapy    Anastrozole daily       INTERVAL HISTORY:  [name] presents to the [facility] today for our initial meeting to review her survivorship care plan detailing her treatment course for breast cancer, as well as monitoring long-term side effects of that treatment, education regarding health maintenance, screening, and overall wellness and health promotion.     Overall, Christina Hardin reports feeling quite well.  She has been on two anti estrogen treatments since she completed radiation.  First, she was taking Tamoxifen and thought it caused a rash on her face.  She stopped that and started taking Anastrozole however it has caused a difficulty with sleeping.  She has since stopped anastrozole.  She isn't taking anything right now and hasn't for a few weeks.  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    Christina Hardin is not having any current issues other than fatigue that is slowly improving, and vaginal dryness.  She has not yet had intercourse, but is engaged and plans to soon.  She says the vaginal dryness has been present since her hysterectomy 8 years ago.  She is unaware if she has had any vaginal atrophy.      REVIEW OF SYSTEMS:  Review of Systems  Constitutional: Negative for appetite change, chills, fatigue, fever and unexpected  weight change.  HENT:   Negative for hearing loss and lump/mass.   Eyes: Negative for eye problems and icterus.  Respiratory: Negative for chest tightness, cough and shortness of breath.   Cardiovascular: Negative for chest pain, leg swelling and palpitations.  Gastrointestinal: Negative for abdominal distention.  Endocrine: Negative for hot flashes.  Musculoskeletal: Negative for arthralgias.  Skin: Negative for itching and rash.  Neurological: Negative for dizziness, extremity weakness, headaches and numbness.  Hematological: Negative for adenopathy. Does not bruise/bleed easily.  Psychiatric/Behavioral: Negative for depression. The patient is not nervous/anxious.   Breast: Denies any new nodularity, masses, tenderness, nipple changes, or nipple discharge.      ONCOLOGY TREATMENT TEAM:  1. Surgeon:  Dr. Georgette Dover at [facility] 2. Medical Oncologist: Dr. Lindi Adie  3. Radiation Oncologist: Dr. Lisbeth Renshaw    PAST MEDICAL/SURGICAL HISTORY:  Past Medical History:  Diagnosis Date  . A-fib ([place])   . Anemia   . Anxiety   . Breast cancer ([place]) 09/05/2016   left breast dcis  . Chronic kidney disease    hx kidney stone  . Ductal carcinoma in situ (DCIS) of left breast   . Family history of breast cancer   . Gastric bypass status for obesity   . GERD (gastroesophageal reflux disease)   . Hypertension   . Rheumatoid arthritis ([place])   . Thoracic aortic aneurysm ([place])   . Thoracic aortic aneurysm ([place])  Past Surgical History:  Procedure Laterality Date  . ABDOMINAL HYSTERECTOMY    . BREAST BIOPSY Left   . BREAST LUMPECTOMY WITH RADIOACTIVE SEED AND SENTINEL LYMPH NODE BIOPSY Left 10/09/2016   Procedure: BREAST LUMPECTOMY WITH RADIOACTIVE SEED AND SENTINEL LYMPH NODE BIOPSY;  Surgeon: Donnie Mesa, MD;  Location: [place];  Service: General;  Laterality: Left;  . COLONOSCOPY W/ POLYPECTOMY    . GASTRIC BYPASS    . HYSTERECTOMY ABDOMINAL WITH SALPINGECTOMY         ALLERGIES:  Allergies  Allergen Reactions  . Dilaudid [Hydromorphone Hcl] Shortness Of Breath  . Propoxyphene Hcl     REACTION: unspecified     CURRENT MEDICATIONS:  Outpatient Encounter Prescriptions as of 07/03/2017  Medication Sig  . Acetaminophen (TYLENOL ARTHRITIS PAIN PO) Take by mouth.  . Cyanocobalamin (VITAMIN B-12) 2500 MCG SUBL Take 1 tablet by mouth daily. Reported on 06/08/2016  . diphenhydramine-acetaminophen (TYLENOL PM) 25-500 MG TABS tablet Take 1 tablet by mouth.  [name] 5 MG TABS tablet TAKE 1 TABLET BY MOUTH 2 TIMES DAILY.  . hydrochlorothiazide (HYDRODIURIL) 12.5 MG tablet Take 1 tablet (12.5 mg total) by mouth daily.  . metoprolol succinate (TOPROL-XL) 25 MG 24 hr tablet Take 1 tablet (25 mg total) by mouth daily.  . metroNIDAZOLE (METROGEL) 1 % gel Apply topically daily.  . Multiple Vitamin (MULTIVITAMIN) capsule Take 1 capsule by mouth daily. Reported on 06/08/2016  . traMADol (ULTRAM) 50 MG tablet Take 1 tablet (50 mg total) by mouth every 8 (eight) hours as needed.  . tamoxifen (NOLVADEX) 20 MG tablet Take 1 tablet (20 mg total) by mouth daily.   No facility-administered encounter medications on file as of 07/03/2017.      ONCOLOGIC FAMILY HISTORY:  Family History  Problem Relation Age of Onset  . Atrial fibrillation Mother   . Dementia Mother   . Hypertension Mother   . Gout Mother   . Arthritis Mother   . Stroke Mother   . COPD Father   . Emphysema Father   . Lung cancer Father   . Melanoma Father   . Hypertension Sister   . Sleep apnea Sister   . Healthy Brother   . Healthy Brother   . Heart attack Maternal Aunt   . Breast cancer Maternal Aunt        dx over 81  . Heart attack Maternal Uncle   . Prostate cancer Maternal Uncle        unsure if this was truly prostate cancer  . Heart attack Paternal Uncle   . Heart attack Maternal Grandfather   . Breast cancer Cousin        mat first cousin  . Breast cancer Cousin        mat first  cousin  . Cancer Cousin        father's maternal first cousin with Breast, Ovarian, Uterine Cancer  . Leukemia Other        father's maternal uncle  . Leukemia Other        father's maternal uncle  . Leukemia Cousin        3 of father's maternal first cousin       SOCIAL HISTORY:  TIMA CURET is engaged and is currently living alone in [place], [place].  She has 3 children and they live in [place] as well.  [name] is currently retired.  She denies any current or history of tobacco, alcohol, or illicit drug use.  PHYSICAL EXAMINATION:  Vital Signs:   Vitals:   07/03/17 1121  BP: 133/76  Pulse: 65  Resp: 18  Temp: 98.2 F (36.8 C)   Filed Weights   07/03/17 1121  Weight: 180 lb 12.8 oz (82 kg)   General: Well-nourished, well-appearing female in no acute distress.  She is unaccompanied today.   HEENT: Head is normocephalic.  Pupils equal and reactive to light. Conjunctivae clear without exudate.  Sclerae anicteric. Oral mucosa is pink, moist.  Oropharynx is pink without lesions or erythema.  Lymph: No cervical, supraclavicular, or infraclavicular lymphadenopathy noted on palpation.  Cardiovascular: Regular rate and rhythm.Marland Kitchen Respiratory: Clear to auscultation bilaterally. Chest expansion symmetric; breathing non-labored.  Breasts: left breast s/p lumpectomy, mild radiation changes noted, site is without nodularity, no nodules, masses noted, right breast without nodules masses, skin or nipple changes. GI: Abdomen soft and round; non-tender, non-distended. Bowel sounds normoactive.  GU: Deferred.  Neuro: No focal deficits. Steady gait.  Psych: Mood and affect normal and appropriate for situation.  Extremities: No edema. MSK: No focal spinal tenderness to palpation.  Full range of motion in bilateral upper extremities Skin: Warm and dry.  LABORATORY DATA:  None for this visit.  DIAGNOSTIC IMAGING:  None for this visit.      ASSESSMENT AND PLAN:    Ms.. Christina Hardin is a pleasant 66 y.o. female with Stage 0 left breast DCIS, ER+/PR+/HER2-, diagnosed in 10/2016, treated with lumpectomy, adjuvant radiation therapy, and anti-estrogen therapy with Anastrozole beginning in 04/2017.  She presents to the Survivorship Clinic for our initial meeting and routine follow-up post-completion of treatment for breast cancer.    1. Stage 0 left breast cancer:  [name] is continuing to recover from definitive treatment for breast cancer. She will follow-up with her medical oncologist, Dr. Lindi Adie in three months with history and physical exam per surveillance protocol.  Today, a comprehensive survivorship care plan and treatment summary was reviewed with the patient today detailing her breast cancer diagnosis, treatment course, potential late/long-term effects of treatment, appropriate follow-up care with recommendations for the future, and patient education resources.  A copy of this summary, along with a letter will be sent to the patient's primary care provider via mail/fax/In Basket message after today's visit.    2. Anti-estrogen therapy: After discussion, we decided to start her back on Tamoxifen daily.  I reviewed the possible side effects with her in detail.  She will give this another try and let me know if she has any issues with this.    3. Vaginal Dryness: She and I reviewed her issues with vaginal dryness and quite likely vaginal atrophy.  We reviewed using a moisturizer such as Replens every three days and coconut oil as a lubricant.  I encouraged her to use a vaginal dilator for a few weeks before becoming sexually active to prevent dyspareunia.  I gave her information about other resources available for vaginal dryness/atrophy such as [employer] Touch, and Pelvic Floor PT.    4. Bone health:  Given Christina Hardin's age/history of breast cancer she is at risk for bone demineralization.  She was counseled that the Tamoxifen will help her bone density.  She was  given education on specific activities to promote bone health.  I will defer to her PCP regarding bone density testing and management.    5. Cancer screening:  Due to Christina Hardin's history and her age, she should receive screening for skin cancers, colon cancer, and gynecologic cancers.  The information  and recommendations are listed on the patient's comprehensive care plan/treatment summary and were reviewed in detail with the patient.    6. Health maintenance and wellness promotion: [name] was encouraged to consume 5-7 servings of fruits and vegetables per day. We reviewed the "Nutrition Rainbow" handout, as well as the handout "Take Control of Your Health and Reduce Your Cancer Risk" from the [employer].  She was also encouraged to engage in moderate to vigorous exercise for 30 minutes per day most days of the week. We discussed the [employer] fitness program, which is designed for cancer survivors to help them become more physically fit after cancer treatments.  She was instructed to limit her alcohol consumption and continue to abstain from tobacco use.     7. Support services/counseling: It is not uncommon for this period of the patient's cancer care trajectory to be one of many emotions and stressors.  We discussed an opportunity for her to participate in the next session of [facility] ("Finding Your New Normal") support group series designed for patients after they have completed treatment.   [name] was encouraged to take advantage of our many other support services programs, support groups, and/or counseling in coping with her new life as a cancer survivor after completing anti-cancer treatment.  She was offered support today through active listening and expressive supportive counseling.  She was given information regarding our available services and encouraged to contact me with any questions or for help enrolling in any of our support group/programs.    Dispo:   -Return to  cancer center in three months for follow up with Dr. Lindi Adie  -Mammogram due in October, 2018 -Follow up with Dr. Georgette Dover in 6 months -She is welcome to return back to the Survivorship Clinic at any time; no additional follow-up needed at this time.  -Consider referral back to survivorship as a long-term survivor for continued surveillance  A total of (50) minutes of face-to-face time was spent with this patient with greater than 50% of that time in counseling and care-coordination.   Gardenia Phlegm, NP Survivorship Program [place] [phone]   Note: PRIMARY CARE PROVIDER Zenia Resides, [place] [phone]

## 2017-07-05 ENCOUNTER — Emergency Department (HOSPITAL_COMMUNITY): Payer: Medicare Other

## 2017-07-05 ENCOUNTER — Encounter (HOSPITAL_COMMUNITY): Payer: Self-pay | Admitting: *Deleted

## 2017-07-05 ENCOUNTER — Emergency Department (HOSPITAL_COMMUNITY)
Admission: EM | Admit: 2017-07-05 | Discharge: 2017-07-05 | Disposition: A | Discharge status: Home / Self Care | Payer: Medicare Other | Attending: Emergency Medicine | Admitting: Emergency Medicine

## 2017-07-05 DIAGNOSIS — Z79899 Other long term (current) drug therapy: Secondary | ICD-10-CM | POA: Diagnosis not present

## 2017-07-05 DIAGNOSIS — R51 Headache: Secondary | ICD-10-CM | POA: Diagnosis not present

## 2017-07-05 DIAGNOSIS — R112 Nausea with vomiting, unspecified: Secondary | ICD-10-CM | POA: Diagnosis not present

## 2017-07-05 DIAGNOSIS — D649 Anemia, unspecified: Secondary | ICD-10-CM | POA: Insufficient documentation

## 2017-07-05 DIAGNOSIS — I10 Essential (primary) hypertension: Secondary | ICD-10-CM | POA: Insufficient documentation

## 2017-07-05 DIAGNOSIS — G4489 Other headache syndrome: Secondary | ICD-10-CM

## 2017-07-05 LAB — COMPREHENSIVE METABOLIC PANEL
ALBUMIN: 3.9 g/dL (ref 3.5–5.0)
ALK PHOS: 78 U/L (ref 38–126)
ALT: 16 U/L (ref 14–54)
ANION GAP: 9 (ref 5–15)
AST: 29 U/L (ref 15–41)
BILIRUBIN TOTAL: 0.6 mg/dL (ref 0.3–1.2)
BUN: 22 mg/dL — AB (ref 6–20)
CALCIUM: 10.8 mg/dL — AB (ref 8.9–10.3)
CO2: 26 mmol/L (ref 22–32)
CREATININE: 0.79 mg/dL (ref 0.44–1.00)
Chloride: 102 mmol/L (ref 101–111)
GFR calc Af Amer: >60 mL/min (ref >60)
GFR calc non Af Amer: >60 mL/min (ref >60)
GLUCOSE: 114 mg/dL — AB (ref 65–99)
Potassium: 3.5 mmol/L (ref 3.5–5.1)
Sodium: 137 mmol/L (ref 135–145)
TOTAL PROTEIN: 7.5 g/dL (ref 6.5–8.1)

## 2017-07-05 LAB — URINALYSIS, ROUTINE W REFLEX MICROSCOPIC
BILIRUBIN URINE: NEGATIVE
Bacteria, UA: NONE SEEN
GLUCOSE, UA: NEGATIVE mg/dL
Ketones, ur: 5 mg/dL — AB
Leukocytes, UA: NEGATIVE
NITRITE: NEGATIVE
PH: 7 (ref 5.0–8.0)
Protein, ur: NEGATIVE mg/dL
SPECIFIC GRAVITY, URINE: 1.019 (ref 1.005–1.030)
Squamous Epithelial / LPF: NONE SEEN

## 2017-07-05 LAB — CBC
HCT: 35.6 % — ABNORMAL LOW (ref 36.0–46.0)
HEMOGLOBIN: 11.5 g/dL — AB (ref 12.0–15.0)
MCH: 27.2 pg (ref 26.0–34.0)
MCHC: 32.3 g/dL (ref 30.0–36.0)
MCV: 84.2 fL (ref 78.0–100.0)
PLATELETS: 237 10*3/uL (ref 150–400)
RBC: 4.23 MIL/uL (ref 3.87–5.11)
RDW: 14.7 % (ref 11.5–15.5)
WBC: 7.6 10*3/uL (ref 4.0–10.5)

## 2017-07-05 MED ORDER — DIPHENHYDRAMINE HCL 50 MG/ML IJ SOLN
25.0000 mg | Freq: Once | INTRAMUSCULAR | Status: AC
  Administered 2017-07-05: 25 mg via INTRAVENOUS
  Filled 2017-07-05: qty 1

## 2017-07-05 MED ORDER — METOCLOPRAMIDE HCL 10 MG PO TABS: 10.0000 mg | ORAL_TABLET | Freq: Four times a day (QID) | ORAL | 0 refills | Status: DC

## 2017-07-05 MED ORDER — ONDANSETRON 4 MG PO TBDP
8.0000 mg | ORAL_TABLET | Freq: Once | ORAL | Status: AC
  Administered 2017-07-05: 8 mg via ORAL
  Filled 2017-07-05: qty 2

## 2017-07-05 MED ORDER — METOCLOPRAMIDE HCL 5 MG/ML IJ SOLN
10.0000 mg | Freq: Once | INTRAMUSCULAR | Status: AC
  Administered 2017-07-05: 10 mg via INTRAVENOUS
  Filled 2017-07-05: qty 2

## 2017-07-05 NOTE — ED Triage Notes (Signed)
thwe pt is c/o a severe headache since this am  She has had n v  She was just started  On tamoxiphen breast cancer nov 17  She does not usually have headaches.   She took a tramadol  2hours and vomited it right back

## 2017-07-05 NOTE — ED Notes (Signed)
Patient transported to CT 

## 2017-07-05 NOTE — ED Provider Notes (Addendum)
[place] DEPT Provider Note   CSN: [id number] Arrival date & time: 07/05/17  2004     History   Chief Complaint Chief Complaint  Patient presents with  . Headache  . Nausea    HPI Christina Hardin is a 66 y.o. female.  HPI Complains of gradual onset throbbing headache frontal in location onset 9 AM today accompanied by 2 episodes of vomiting and nausea. It is like migraine headache she's had in the past only worse. She treated self with tramadol with partial relief. No fever no trauma no blurred vision no other associated symptoms are nothing makes symptoms better or worse Past Medical History:  Diagnosis Date  . A-fib ([place])   . Anemia   . Anxiety   . Breast cancer ([place]) 09/05/2016   left breast dcis  . Chronic kidney disease    hx kidney stone  . Ductal carcinoma in situ (DCIS) of left breast   . Family history of breast cancer   . Gastric bypass status for obesity   . GERD (gastroesophageal reflux disease)   . Hypertension   . Rheumatoid arthritis ([place])   . Thoracic aortic aneurysm ([place])   . Thoracic aortic aneurysm [facility])     Patient Active Problem List   Diagnosis Date Noted  . Cervical lymphadenopathy 06/20/2017  . Bilateral hand pain 06/19/2017  . Carcinoma of breast upper outer quadrant ([place]) 11/16/2016  . Family history of breast cancer   . 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  . Bursitis of right knee 08/10/2016  . Paroxysmal atrial flutter ([place]) 07/06/2016  . Diastolic dysfunction without heart failure 07/06/2016  . Paroxysmal atrial fibrillation ([place]) 06/21/2016  . Pre-syncope 06/08/2016  . Thoracic aortic aneurysm without rupture ([place]) 06/08/2016  . Anemia, iron deficiency 05/25/2016  . H/O bariatric surgery 12/20/2014  . H/O hysterectomy with oophorectomy 10/09/2013  . Thumb pain 10/09/2013  . Restless leg syndrome 10/09/2013  . Carpal tunnel syndrome 09/11/2013  . Osteopenia 09/11/2013  . Unspecified hereditary and  idiopathic peripheral neuropathy 09/11/2013  . Routine general medical examination at a health care facility 09/11/2013  . Colon polyp 05/02/2012  . KNEE PAIN, LEFT 11/16/2008  . NEPHROLITHIASIS 09/17/2008  . HEMATURIA UNSPECIFIED 03/15/2008  . CHEST PAIN, INTERMITTENT 09/12/2007  . OBESITY, NOS 01/30/2007  . Anxiety and depression 01/30/2007  . MIGRAINE, UNSPEC., W/O INTRACTABLE MIGRAINE 01/30/2007  . Essential hypertension 01/30/2007  . HEMORRHOIDS, NOS 01/30/2007  . REFLUX ESOPHAGITIS 01/30/2007  . DIVERTICULOSIS OF COLON 01/30/2007  . ROSACEA 01/30/2007  . BACK PAIN, LOW 01/30/2007  . APNEA, SLEEP 01/30/2007  Migraine headache  Past Surgical History:  Procedure Laterality Date  . ABDOMINAL HYSTERECTOMY    . BREAST BIOPSY Left   . BREAST LUMPECTOMY WITH RADIOACTIVE SEED AND SENTINEL LYMPH NODE BIOPSY Left 10/09/2016   Procedure: BREAST LUMPECTOMY WITH RADIOACTIVE SEED AND SENTINEL LYMPH NODE BIOPSY;  Surgeon: Donnie Mesa, MD;  Location: [facility];  Service: General;  Laterality: Left;  . COLONOSCOPY W/ POLYPECTOMY    . GASTRIC BYPASS    . HYSTERECTOMY ABDOMINAL WITH SALPINGECTOMY      OB History    No data available       Home Medications    Prior to Admission medications   Medication Sig Start Date End Date Taking? Authorizing Provider  Acetaminophen (TYLENOL ARTHRITIS PAIN PO) Take by mouth.    [provider]  Cyanocobalamin (VITAMIN B-12) 2500 MCG SUBL Take 1 tablet by mouth daily. Reported on 06/08/2016  [provider]  diphenhydramine-acetaminophen (TYLENOL PM) 25-500 MG TABS tablet Take 1 tablet by mouth.    [provider]  ELIQUIS 5 MG TABS tablet TAKE 1 TABLET BY MOUTH 2 TIMES DAILY. 05/28/17   Hilty, Nadean Corwin, MD  hydrochlorothiazide (HYDRODIURIL) 12.5 MG tablet Take 1 tablet (12.5 mg total) by mouth daily. 02/14/17   Zenia Resides, MD  metoprolol succinate (TOPROL-XL) 25 MG 24 hr tablet Take 1 tablet (25 mg  total) by mouth daily. 07/06/16   Hilty, Nadean Corwin, MD  metroNIDAZOLE (METROGEL) 1 % gel Apply topically daily. 04/11/17   [name], DO  Multiple Vitamin (MULTIVITAMIN) capsule Take 1 capsule by mouth daily. Reported on 06/08/2016    [provider]  tamoxifen (NOLVADEX) 20 MG tablet Take 1 tablet (20 mg total) by mouth daily. 07/03/17   Gardenia Phlegm, NP  traMADol (ULTRAM) 50 MG tablet Take 1 tablet (50 mg total) by mouth every 8 (eight) hours as needed. 06/19/17   Zenia Resides, MD    Family History Family History  Problem Relation Age of Onset  . Atrial fibrillation Mother   . Dementia Mother   . Hypertension Mother   . Gout Mother   . Arthritis Mother   . Stroke Mother   . COPD Father   . Emphysema Father   . Lung cancer Father   . Melanoma Father   . Hypertension Sister   . Sleep apnea Sister   . Healthy Brother   . Healthy Brother   . Heart attack Maternal Aunt   . Breast cancer Maternal Aunt        dx over 41  . Heart attack Maternal Uncle   . Prostate cancer Maternal Uncle        unsure if this was truly prostate cancer  . Heart attack Paternal Uncle   . Heart attack Maternal Grandfather   . Breast cancer Cousin        mat first cousin  . Breast cancer Cousin        mat first cousin  . Cancer Cousin        father's maternal first cousin with Breast, Ovarian, Uterine Cancer  . Leukemia Other        father's maternal uncle  . Leukemia Other        father's maternal uncle  . Leukemia Cousin        3 of father's maternal first cousin    Social History Social History  Substance Use Topics  . Smoking status: Never Smoker  . Smokeless tobacco: Never Used  . Alcohol use No     Allergies   Dilaudid [hydromorphone hcl] and Propoxyphene hcl   Review of Systems Review of Systems  Constitutional: Negative.   HENT: Negative.   Eyes: Positive for photophobia.  Respiratory: Negative.   Cardiovascular: Negative.   Gastrointestinal:  Positive for nausea.  Musculoskeletal: Negative.   Skin: Negative.   Allergic/Immunologic: Positive for immunocompromised state.       Cancer patient  Neurological: Positive for headaches.  Psychiatric/Behavioral: Negative.   All other systems reviewed and are negative.    Physical Exam Updated Vital Signs BP (!) 144/73 (BP Location: Right Arm)   Pulse (!) 54   Temp 97.6 F (36.4 C) (Oral)   Resp 16   Ht 5' 4.5" (1.638 m)   Wt 81.6 kg (180 lb)   SpO2 100%   BMI 30.42 kg/m   Physical Exam  Constitutional: She is oriented  to person, place, and time. She appears well-developed and well-nourished.  HENT:  Head: Normocephalic and atraumatic.  Eyes: Pupils are equal, round, and reactive to light. Conjunctivae are normal.  Neck: Neck supple. No tracheal deviation present. No thyromegaly present.  Cardiovascular: Normal rate.   No murmur heard. Irregularly irregular  Pulmonary/Chest: Effort normal and breath sounds normal.  Abdominal: Soft. Bowel sounds are normal. She exhibits no distension. There is no tenderness.  Musculoskeletal: Normal range of motion. She exhibits no edema or tenderness.  Neurological: She is alert and oriented to person, place, and time. Coordination normal.  Gait normal Romberg normal pronator drift normal finger to nose normal DTR symmetric bilaterally at knee jerk ankle jerk and biceps toes downward going bilaterally  Skin: Skin is warm and dry. No rash noted.  Psychiatric: She has a normal mood and affect.  Nursing note and vitals reviewed.    ED Treatments / Results  Labs (all labs ordered are listed, but only abnormal results are displayed) Labs Reviewed  COMPREHENSIVE METABOLIC PANEL - Abnormal; Notable for the following:       Result Value   Glucose, Bld 114 (*)    BUN 22 (*)    Calcium 10.8 (*)    All other components within normal limits  CBC - Abnormal; Notable for the following:    Hemoglobin 11.5 (*)    HCT 35.6 (*)    All other  components within normal limits  URINALYSIS, ROUTINE W REFLEX MICROSCOPIC    EKG  EKG Interpretation None       Radiology No results found.  Procedures Procedures (including critical care time)  Medications Ordered in ED Medications  metoCLOPramide (REGLAN) injection 10 mg (not administered)  diphenhydrAMINE (BENADRYL) injection 25 mg (not administered)  ondansetron (ZOFRAN-ODT) disintegrating tablet 8 mg (8 mg Oral Given 07/05/17 2021)    Results for orders placed or performed during the hospital encounter of 07/05/17  Comprehensive metabolic panel  Result Value Ref Range   Sodium 137 135 - 145 mmol/L   Potassium 3.5 3.5 - 5.1 mmol/L   Chloride 102 101 - 111 mmol/L   CO2 26 22 - 32 mmol/L   Glucose, Bld 114 (H) 65 - 99 mg/dL   BUN 22 (H) 6 - 20 mg/dL   Creatinine, Ser 0.79 0.44 - 1.00 mg/dL   Calcium 10.8 (H) 8.9 - 10.3 mg/dL   Total Protein 7.5 6.5 - 8.1 g/dL   Albumin 3.9 3.5 - 5.0 g/dL   AST 29 15 - 41 U/L   ALT 16 14 - 54 U/L   Alkaline Phosphatase 78 38 - 126 U/L   Total Bilirubin 0.6 0.3 - 1.2 mg/dL   GFR calc non Af Amer >60 >60 mL/min   GFR calc Af Amer >60 >60 mL/min   Anion gap 9 5 - 15  CBC  Result Value Ref Range   WBC 7.6 4.0 - 10.5 K/uL   RBC 4.23 3.87 - 5.11 MIL/uL   Hemoglobin 11.5 (L) 12.0 - 15.0 g/dL   HCT 35.6 (L) 36.0 - 46.0 %   MCV 84.2 78.0 - 100.0 fL   MCH 27.2 26.0 - 34.0 pg   MCHC 32.3 30.0 - 36.0 g/dL   RDW 14.7 11.5 - 15.5 %   Platelets 237 150 - 400 K/uL  Urinalysis, Routine w reflex microscopic  Result Value Ref Range   Color, Urine YELLOW YELLOW   APPearance HAZY (A) CLEAR   Specific Gravity, Urine 1.019 1.005 - 1.030  pH 7.0 5.0 - 8.0   Glucose, UA NEGATIVE NEGATIVE mg/dL   Hgb urine dipstick MODERATE (A) NEGATIVE   Bilirubin Urine NEGATIVE NEGATIVE   Ketones, ur 5 (A) NEGATIVE mg/dL   Protein, ur NEGATIVE NEGATIVE mg/dL   Nitrite NEGATIVE NEGATIVE   Leukocytes, UA NEGATIVE NEGATIVE   RBC / HPF TOO NUMEROUS TO COUNT 0  - 5 RBC/hpf   WBC, UA 0-5 0 - 5 WBC/hpf   Bacteria, UA NONE SEEN NONE SEEN   Squamous Epithelial / LPF NONE SEEN NONE SEEN   Ct Head Wo Contrast  Result Date: 07/05/2017 CLINICAL DATA:  Severe headache and nausea and vomiting. EXAM: CT HEAD WITHOUT CONTRAST TECHNIQUE: Contiguous axial images were obtained from the base of the skull through the vertex without intravenous contrast. COMPARISON:  None. FINDINGS: Brain: Mild generalized parenchymal atrophy with commensurate dilatation of the ventricles and sulci. No hydrocephalus. No mass, hemorrhage, edema or other evidence of acute parenchymal abnormality. Vascular: No hyperdense vessel or unexpected calcification. Skull: Normal. Negative for fracture or focal lesion. Sinuses/Orbits: No acute finding. Other: None. IMPRESSION: Negative head CT.  No intracranial mass, hemorrhage or edema. Electronically Signed   By: Franki Cabot M.D.   On: 07/05/2017 22:18   US Thyroid  Result Date: 06/10/2017 CLINICAL DATA:  Nodule inferior to right lobe of the thyroid seen on chest CT EXAM: THYROID ULTRASOUND TECHNIQUE: Ultrasound examination of the thyroid gland and adjacent soft tissues was performed. COMPARISON:  CT   05/29/2017 FINDINGS: Parenchymal Echotexture: Mildly heterogenous Isthmus: 0.4 cm thickness Right lobe: 4.4 x 1.5 x 1.7 cm Left lobe: 3.7 x 1.4 x 1.4 cm _________________________________________________________ Estimated total number of nodules >/= 1 cm: 0 Number of spongiform nodules >/=  2 cm not described below (TR1): 0 Number of mixed cystic and solid nodules >/= 1.5 cm not described below ([place]): 0 _________________________________________________________ There is a lobular region in the inferior pole of the right thyroid lobe which was measured by the sonographer but does not correspond to the lesion seen and is probably a pseudo nodule. IMPRESSION: 1. Unremarkable thyroid. 2. The lesion seen on CT is not identified. On CT, it was not contiguous with the  thyroid, but was adjacent to the esophagus at the right posterolateral margin of the trachea at the thoracic inlet. Primary considerations adenopathy or parathyroid tissue. Attention on follow-up imaging is recommended. The above is in keeping with the ACR TI-RADS recommendations - J Am Coll Radiol 2017;14:587-595. Electronically Signed   By: Lucrezia Europe M.D.   On: 06/10/2017 14:30   Initial Impression / Assessment and Plan / ED Course  I have reviewed the triage vital signs and the nursing notes.  Pertinent labs & imaging results that were available during my care of the patient were reviewed by me and considered in my medical decision making (see chart for details).     10:30 PM patient asymptomatic, pain-free after treatment with IV Reglan, and IV Benadryl. She is alert Glasgow Coma Score 15. Symptoms consistent with migraine headache however may be side effect of tamoxifen which she started yesterday. Plan prescription Reglan. Return as needed or follow-up with oncologist Dr.Gudena as needed. Anemia is chronic Final Clinical Impressions(s) / ED Diagnoses  Diagnosis#1 cephalgia #2 anemia Final diagnoses:  None    New Prescriptions New Prescriptions   No medications on file     Orlie Dakin, MD 07/05/17 [place], MD 07/05/17 [id number]

## 2017-07-05 NOTE — ED Notes (Signed)
Urinalysis cancelled per EDP

## 2017-07-05 NOTE — Discharge Instructions (Signed)
Take the medication prescribed as needed for nausea or headache. If symptoms persist call Dr.Gudena for follow-up. Your medication may need to be changed. Return if your pain is not well controlled, if you have vomiting that is not controlled with the medication prescribed or if concern for any reason

## 2017-07-06 ENCOUNTER — Other Ambulatory Visit: Payer: Self-pay | Admitting: Internal Medicine

## 2017-07-31 ENCOUNTER — Other Ambulatory Visit: Payer: Self-pay | Admitting: Orthopedic Surgery

## 2017-08-02 ENCOUNTER — Telehealth: Payer: Self-pay | Admitting: Internal Medicine

## 2017-08-02 NOTE — Telephone Encounter (Signed)
Clearance routed via EPIC 

## 2017-08-02 NOTE — Telephone Encounter (Signed)
The [facility] requests clearance for: 1. Type of surgery: anesthesia = axillary block SUSPENSIONPLASTY LEFT THUMB TRAPENZIUM EXCISION ABDUCTOR POLLICIS LONGUS Left General  LEFT CARPAL TUNNEL RELEASE Left General  POSSIBLE ULNAR NERVE DECOMPRESSION/TRANSPOSITION Left General  2. Date of surgery: 08/22/2017 3. Surgeon: Dr. Daryll Brod 4. Medications that need to be held & how long: none specified - on Eliquis 5mg  BID 5. Fax and/or Phone: (p) [phone]   [phone]  -- attn: Cyndee Brightly, RN

## 2017-08-02 NOTE — Telephone Encounter (Signed)
Ok to hold Eliquis 3 days prior to surgery. Restart after. Acceptable risk.  Dr. Lemmie Evens

## 2017-08-15 ENCOUNTER — Ambulatory Visit (INDEPENDENT_AMBULATORY_CARE_PROVIDER_SITE_OTHER): Payer: Medicare Other | Admitting: *Deleted

## 2017-08-15 ENCOUNTER — Encounter (HOSPITAL_BASED_OUTPATIENT_CLINIC_OR_DEPARTMENT_OTHER): Payer: Self-pay | Admitting: *Deleted

## 2017-08-15 ENCOUNTER — Encounter: Payer: Self-pay | Admitting: *Deleted

## 2017-08-15 VITALS — BP 140/94 | HR 59 | Temp 98.3°F | Ht 64.5 in | Wt 184.8 lb

## 2017-08-15 DIAGNOSIS — Z0000 Encounter for general adult medical examination without abnormal findings: Secondary | ICD-10-CM | POA: Diagnosis not present

## 2017-08-15 DIAGNOSIS — Z23 Encounter for immunization: Secondary | ICD-10-CM

## 2017-08-15 NOTE — Progress Notes (Signed)
Subjective:   Christina Hardin is a 66 y.o. female who presents for an Initial Medicare Annual Wellness Visit.   Cardiac Risk Factors include: advanced age (>40men, >65 women);hypertension;sedentary lifestyle;obesity (BMI >30kg/m2)     Objective:    Today's Vitals   08/15/17 1458 08/15/17 1521 08/15/17 1627  BP: (!) 140/94 (!) 140/94   Pulse: (!) 59    Temp: 98.3 F (36.8 C)    TempSrc: Oral    SpO2: 96%    Weight: 184 lb 12.8 oz (83.8 kg)    Height: 5' 4.5" (1.638 m)    PainSc: 5   5   PainLoc: Hand     Body mass index is 31.23 kg/m.   Current Medications (verified) Outpatient Encounter Prescriptions as of 08/15/2017  Medication Sig  . acetaminophen (TYLENOL) 500 MG tablet Take 1,000 mg by mouth every 6 (six) hours as needed for headache (pain).  . Cyanocobalamin 2500 MCG CHEW Chew 2,500 mcg by mouth daily. Vitamin B12  . ELIQUIS 5 MG TABS tablet TAKE 1 TABLET BY MOUTH 2 TIMES DAILY. (Patient taking differently: TAKE 1 TABLET (5 MG) BY MOUTH 2 TIMES DAILY.)  . hydrochlorothiazide (HYDRODIURIL) 12.5 MG tablet Take 1 tablet (12.5 mg total) by mouth daily.  . metoprolol succinate (TOPROL-XL) 25 MG 24 hr tablet TAKE 1 TABLET BY MOUTH DAILY.  . metroNIDAZOLE (METROGEL) 1 % gel Apply topically daily. (Patient taking differently: Apply 1 application topically daily as needed (rosacea). )  . Multiple Vitamins-Minerals (ADULT ONE DAILY GUMMIES) CHEW Chew 2 tablets by mouth daily with lunch.  . Potassium 99 MG TABS Take 99 mg by mouth daily.  . tamoxifen (NOLVADEX) 20 MG tablet Take 1 tablet (20 mg total) by mouth daily.  . [DISCONTINUED] metoCLOPramide (REGLAN) 10 MG tablet Take 1 tablet (10 mg total) by mouth every 6 (six) hours.  . [DISCONTINUED] traMADol (ULTRAM) 50 MG tablet Take 1 tablet (50 mg total) by mouth every 8 (eight) hours as needed. (Patient not taking: Reported on 08/15/2017)   No facility-administered encounter medications on file as of 08/15/2017.     Allergies  (verified) Dilaudid [hydromorphone hcl] and Propoxyphene hcl   History: Past Medical History:  Diagnosis Date  . A-fib ([place])   . Anxiety   . Breast cancer ([place]) 09/05/2016   left breast dcis  . Carpal tunnel syndrome   . Ductal carcinoma in situ (DCIS) of left breast   . Family history of breast cancer   . Gastric bypass status for obesity   . GERD (gastroesophageal reflux disease)   . Hypertension   . Rheumatoid arthritis ([place])   . Thoracic aortic aneurysm ([place])   . Thoracic aortic aneurysm [facility])    Past Surgical History:  Procedure Laterality Date  . ABDOMINAL HYSTERECTOMY    . BREAST BIOPSY Left   . BREAST LUMPECTOMY WITH RADIOACTIVE SEED AND SENTINEL LYMPH NODE BIOPSY Left 10/09/2016   Procedure: BREAST LUMPECTOMY WITH RADIOACTIVE SEED AND SENTINEL LYMPH NODE BIOPSY;  Surgeon: Donnie Mesa, MD;  Location: [facility];  Service: General;  Laterality: Left;  . COLONOSCOPY W/ POLYPECTOMY    . GASTRIC BYPASS    . HYSTERECTOMY ABDOMINAL WITH SALPINGECTOMY     Family History  Problem Relation Age of Onset  . Atrial fibrillation Mother   . Dementia Mother   . Hypertension Mother   . Gout Mother   . Arthritis Mother   . Stroke Mother   . COPD Father   . Emphysema Father   .  Lung cancer Father   . Melanoma Father   . Hypertension Sister   . Sleep apnea Sister   . Healthy Brother   . Hypertension Brother   . Healthy Brother   . Hypertension Brother   . Heart attack Maternal Aunt   . Breast cancer Maternal Aunt        dx over 22  . Heart attack Maternal Uncle   . Prostate cancer Maternal Uncle        unsure if this was truly prostate cancer  . Heart attack Paternal Uncle   . Heart attack Maternal Grandfather   . Breast cancer Cousin        mat first cousin  . Breast cancer Cousin        mat first cousin  . Cancer Cousin        father's maternal first cousin with Breast, Ovarian, Uterine Cancer  . Leukemia Other        father's maternal uncle  .  Leukemia Other        father's maternal uncle  . Leukemia Cousin        3 of father's maternal first cousin   Social History   Occupational History  . Not on file.   Social History Main Topics  . Smoking status: Never Smoker  . Smokeless tobacco: Never Used  . Alcohol use No  . Drug use: No  . Sexual activity: No    Tobacco Counseling Counseling given: Yes Patient has never smoked and has no plans to start.   Activities of Daily Living In your present state of health, do you have any difficulty performing the following activities: 08/15/2017 10/09/2016  Hearing? N N  Vision? N N  Difficulty concentrating or making decisions? Y N  Walking or climbing stairs? N N  Dressing or bathing? N N  Doing errands, shopping? N -  Preparing Food and eating ? N -  Using the Toilet? N -  In the past six months, have you accidently leaked urine? N -  Do you have problems with loss of bowel control? N -  Managing your Medications? N -  Managing your Finances? N -  Housekeeping or managing your Housekeeping? N -  Some recent data might be hidden   Home Safety:  My home has a working smoke alarm:  Yes X 2           My home throw rugs have been fastened down to the floor or removed:  Non-slip backs I have a non-slip surface or non-slip mats in the bathtub and shower:  Yes        All my home's stairs have handrails, including any outdoor stairs  One level home with 3 outside steps with handrail          My home's floors, stairs and hallways are free from clutter, wires and cords:  Yes     I have animals in my home  No I wear seatbelts consistently:  Yes    Immunizations and Health Maintenance Immunization History  Administered Date(s) Administered  . Influenza Split 09/20/2011  . Influenza Whole 09/12/2007, 10/14/2009  . Influenza,inj,Quad PF,6+ Mos 09/11/2013, 10/09/2013, 12/10/2014, 08/09/2016, 08/15/2017  . Pneumococcal Conjugate-13 06/21/2016  . Pneumococcal Polysaccharide-23  07/03/2002, 12/10/2014  . Td 12/03/1998  . Tdap 11/23/2011, 10/09/2013  . Zoster 11/08/2011   Flu vaccine administered today  There are no preventive care reminders to display for this patient.  Patient Care Team: Zenia Resides, MD as  PCP - General Nicholas Lose, MD as Consulting Physician (Hematology and Oncology) Irene Limbo, MD as Consulting Physician (Plastic Surgery) Kyung Rudd, MD as Consulting Physician (Radiation Oncology) Delice Bison Charlestine Massed, NP as Nurse Practitioner (Hematology and Oncology) Donnie Mesa, MD as Consulting Physician (General Surgery) Levin Erp for Dentistry [facility] Dr. Lyman Bishop for Cardiology  Indicate any recent Medical Services you may have received from other than [facility] providers in the past year (date may be approximate).     Assessment:   This is a routine wellness examination for [place].   Hearing/Vision screen  Hearing Screening   Method: Audiometry   125Hz  250Hz  500Hz  1000Hz  2000Hz  3000Hz  4000Hz  6000Hz  8000Hz   Right ear:   40 40 40  40    Left ear:   40 40 40  40      Dietary issues and exercise activities discussed: Current Exercise Habits: The patient does not participate in regular exercise at present, Exercise limited by: cardiac condition(s);orthopedic condition(s)  Goals    . CONTACT NUTRITIONIST AT [facility]          To assist in weight loss following gastric bypass    . Exercise 3x per week (45 min per time) (pt-stated)          Walking      Depression Screen PHQ 2/9 Scores 08/15/2017 04/11/2017 02/14/2017 08/09/2016 06/21/2016 05/24/2016 12/10/2014  PHQ - 2 Score 0 0 0 0 0 0 3  PHQ- 9 Score - - - - - - -   Patient scored a 6 on the PHQ-9 even though PHQ-2 was zero. Behavioral Health contact info given.   Fall Risk Fall Risk  08/15/2017 06/19/2017 02/14/2017 11/08/2016 10/08/2016  Falls in the past year? No Yes No No Yes  Number falls in past yr: - 1 - - 1  Injury with  Fall? - No - - Yes  Risk Factor Category  - - - - High Fall Risk  Risk for fall due to : - - - - Impaired balance/gait  Follow up - - - - Falls evaluation completed   TUG Test:  Done in 8 seconds. Patient wearing flip flops. Discussed wearing closed, well-fitting shoes to decrease risk of fall. Falls prevention discussed in detail and literature given.  Cognitive Function: Mini-Cog  Passed with score 3/5   Screening Tests Health Maintenance  Topic Date Due  . MAMMOGRAM  08/27/2018  . PNA vac Low Risk Adult (2 of 2 - PPSV23) 12/11/2019  . COLONOSCOPY  04/30/2022  . TETANUS/TDAP  10/10/2023  . INFLUENZA VACCINE  Completed  . DEXA SCAN  Completed  . Hepatitis C Screening  Completed      Plan:    Flu vaccine administered today  I have personally reviewed and noted the following in the patient's chart:   . Medical and social history . Use of alcohol, tobacco or illicit drugs  . Current medications and supplements . Functional ability and status . Nutritional status . Physical activity . Advanced directives . List of other physicians . Hospitalizations, surgeries, and ER visits in previous 12 months . Vitals . Screenings to include cognitive, depression, and falls . Referrals and appointments  In addition, I have reviewed and discussed with patient certain preventive protocols, quality metrics, and best practice recommendations. A written personalized care plan for preventive services as well as general preventive health recommendations were provided to patient.     Velora Heckler, RN   08/15/2017

## 2017-08-15 NOTE — Patient Instructions (Addendum)
[name],  Thank you for taking time to come for yourMedicare Wellness Visit. I appreciate your ongoing commitment to your health goals. Please review the following plan we discussed and let me know if I can assist you in the future.   These are the goals we discussed:  Goals    . 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 Exercise 3x per week (45 min per time) (pt-stated)          Walking        Fall Prevention in the Home Falls can cause injuries. They can happen to people of all ages. There are many things you can do to make your home safe and to help prevent falls. What can I do on the outside of my home?  Regularly fix the edges of walkways and driveways and fix any cracks.  Remove anything that might make you trip as you walk through a door, such as a raised step or threshold.  Trim any bushes or trees on the path to your home.  Use bright outdoor lighting.  Clear any walking paths of anything that might make someone trip, such as rocks or tools.  Regularly check to see if handrails are loose or broken. Make sure that both sides of any steps have handrails.  Any raised decks and porches should have guardrails on the edges.  Have any leaves, snow, or ice cleared regularly.  Use sand or salt on walking paths during winter.  Clean up any spills in your garage right away. This includes oil or grease spills. What can I do in the bathroom?  Use night lights.  Install grab bars by the toilet and in the tub and shower. Do not use towel bars as grab bars.  Use non-skid mats or decals in the tub or shower.  If you need to sit down in the shower, use a plastic, non-slip stool.  Keep the floor dry. Clean up any water that spills on the floor as soon as it happens.  Remove soap buildup in the tub or shower regularly.  Attach bath mats securely with double-sided non-slip rug tape.  Do  not have throw rugs and other things on the floor that can make you trip. What can I do in the bedroom?  Use night lights.  Make sure that you have a light by your bed that is easy to reach.  Do not use any sheets or blankets that are too big for your bed. They should not hang down onto the floor.  Have a firm chair that has side arms. You can use this for support while you get dressed.  Do not have throw rugs and other things on the floor that can make you trip. What can I do in the kitchen?  Clean up any spills right away.  Avoid walking on wet floors.  Keep items that you use a lot in easy-to-reach places.  If you need to reach something above you, use a strong step stool that has a grab bar.  Keep electrical cords out of the way.  Do not use floor polish or wax that makes floors slippery. If you must use wax, use non-skid floor wax.  Do not have throw rugs and other things on the floor that can make you trip. What can I do with my stairs?  Do not leave any items  on the stairs.  Make sure that there are handrails on both sides of the stairs and use them. Fix handrails that are broken or loose. Make sure that handrails are as long as the stairways.  Check any carpeting to make sure that it is firmly attached to the stairs. Fix any carpet that is loose or worn.  Avoid having throw rugs at the top or bottom of the stairs. If you do have throw rugs, attach them to the floor with carpet tape.  Make sure that you have a light switch at the top of the stairs and the bottom of the stairs. If you do not have them, ask someone to add them for you. What else can I do to help prevent falls?  Wear shoes that: ? Do not have high heels. ? Have rubber bottoms. ? Are comfortable and fit you well. ? Are closed at the toe. Do not wear sandals.  If you use a stepladder: ? Make sure that it is fully opened. Do not climb a closed stepladder. ? Make sure that both sides of the stepladder  are locked into place. ? Ask someone to hold it for you, if possible.  Clearly mark and make sure that you can see: ? Any grab bars or handrails. ? First and last steps. ? Where the edge of each step is.  Use tools that help you move around (mobility aids) if they are needed. These include: ? Canes. ? Walkers. ? Scooters. ? Crutches.  Turn on the lights when you go into a dark area. Replace any light bulbs as soon as they burn out.  Set up your furniture so you have a clear path. Avoid moving your furniture around.  If any of your floors are uneven, fix them.  If there are any pets around you, be aware of where they are.  Review your medicines with your doctor. Some medicines can make you feel dizzy. This can increase your chance of falling. Ask your doctor what other things that you can do to help prevent falls. This information is not intended to replace advice given to you by your health care provider. Make sure you discuss any questions you have with your health care provider. Document Released: 09/15/2009 Document Revised: 04/26/2016 Document Reviewed: 12/24/2014 Elsevier Interactive Patient Education  2018 [employer] [employer] Maintenance, Female Adopting a healthy lifestyle and getting preventive care can go a long way to promote health and wellness. Talk with your health care provider about what schedule of regular examinations is right for you. This is a good chance for you to check in with your provider about disease prevention and staying healthy. In between checkups, there are plenty of things you can do on your own. Experts have done a lot of research about which lifestyle changes and preventive measures are most likely to keep you healthy. Ask your health care provider for more information. Weight and diet Eat a healthy diet  Be sure to include plenty of vegetables, fruits, low-fat dairy products, and lean protein.  Do not eat a lot of foods high in solid fats,  added sugars, or salt.  Get regular exercise. This is one of the most important things you can do for your health. ? Most adults should exercise for at least 150 minutes each week. The exercise should increase your heart rate and make you sweat (moderate-intensity exercise). ? Most adults should also do strengthening exercises at least twice a week. This is in addition to  the moderate-intensity exercise.  Maintain a healthy weight  Body mass index (BMI) is a measurement that can be used to identify possible weight problems. It estimates body fat based on height and weight. Your health care provider can help determine your BMI and help you achieve or maintain a healthy weight.  For females 50 years of age and older: ? A BMI below 18.5 is considered underweight. ? A BMI of 18.5 to 24.9 is normal. ? A BMI of 25 to 29.9 is considered overweight. ? A BMI of 30 and above is considered obese.  Watch levels of cholesterol and blood lipids  You should start having your blood tested for lipids and cholesterol at 66 years of age, then have this test every 5 years.  You may need to have your cholesterol levels checked more often if: ? Your lipid or cholesterol levels are high. ? You are older than 66 years of age. ? You are at high risk for heart disease.  Cancer screening Lung Cancer  Lung cancer screening is recommended for adults 82-32 years old who are at high risk for lung cancer because of a history of smoking.  A yearly low-dose CT scan of the lungs is recommended for people who: ? Currently smoke. ? Have quit within the past 15 years. ? Have at least a 30-pack-year history of smoking. A pack year is smoking an average of one pack of cigarettes a day for 1 year.  Yearly screening should continue until it has been 15 years since you quit.  Yearly screening should stop if you develop a health problem that would prevent you from having lung cancer treatment.  Breast Cancer  Practice  breast self-awareness. This means understanding how your breasts normally appear and feel.  It also means doing regular breast self-exams. Let your health care provider know about any changes, no matter how small.  If you are in your 20s or 30s, you should have a clinical breast exam (CBE) by a health care provider every 1-3 years as part of a regular health exam.  If you are 37 or older, have a CBE every year. Also consider having a breast X-ray (mammogram) every year.  If you have a family history of breast cancer, talk to your health care provider about genetic screening.  If you are at high risk for breast cancer, talk to your health care provider about having an MRI and a mammogram every year.  Breast cancer gene (BRCA) assessment is recommended for women who have family members with BRCA-related cancers. BRCA-related cancers include: ? Breast. ? Ovarian. ? Tubal. ? Peritoneal cancers.  Results of the assessment will determine the need for genetic counseling and BRCA1 and BRCA2 testing.  Cervical Cancer Your health care provider may recommend that you be screened regularly for cancer of the pelvic organs (ovaries, uterus, and vagina). This screening involves a pelvic examination, including checking for microscopic changes to the surface of your cervix (Pap test). You may be encouraged to have this screening done every 3 years, beginning at age 53.  For women ages 58-65, health care providers may recommend pelvic exams and Pap testing every 3 years, or they may recommend the Pap and pelvic exam, combined with testing for human papilloma virus (HPV), every 5 years. Some types of HPV increase your risk of cervical cancer. Testing for HPV may also be done on women of any age with unclear Pap test results.  Other health care providers may not recommend any screening  for nonpregnant women who are considered low risk for pelvic cancer and who do not have symptoms. Ask your health care provider  if a screening pelvic exam is right for you.  If you have had past treatment for cervical cancer or a condition that could lead to cancer, you need Pap tests and screening for cancer for at least 20 years after your treatment. If Pap tests have been discontinued, your risk factors (such as having a new sexual partner) need to be reassessed to determine if screening should resume. Some women have medical problems that increase the chance of getting cervical cancer. In these cases, your health care provider may recommend more frequent screening and Pap tests.  Colorectal Cancer  This type of cancer can be detected and often prevented.  Routine colorectal cancer screening usually begins at 66 years of age and continues through 66 years of age.  Your health care provider may recommend screening at an earlier age if you have risk factors for colon cancer.  Your health care provider may also recommend using home test kits to check for hidden blood in the stool.  A small camera at the end of a tube can be used to examine your colon directly (sigmoidoscopy or colonoscopy). This is done to check for the earliest forms of colorectal cancer.  Routine screening usually begins at age 82.  Direct examination of the colon should be repeated every 5-10 years through 66 years of age. However, you may need to be screened more often if early forms of precancerous polyps or small growths are found.  Skin Cancer  Check your skin from head to toe regularly.  Tell your health care provider about any new moles or changes in moles, especially if there is a change in a mole's shape or color.  Also tell your health care provider if you have a mole that is larger than the size of a pencil eraser.  Always use sunscreen. Apply sunscreen liberally and repeatedly throughout the day.  Protect yourself by wearing long sleeves, pants, a wide-brimmed hat, and sunglasses whenever you are outside.  Heart disease,  diabetes, and high blood pressure  High blood pressure causes heart disease and increases the risk of stroke. High blood pressure is more likely to develop in: ? People who have blood pressure in the high end of the normal range (130-139/85-89 mm Hg). ? People who are overweight or obese. ? People who are African American.  If you are 81-34 years of age, have your blood pressure checked every 3-5 years. If you are 12 years of age or older, have your blood pressure checked every year. You should have your blood pressure measured twice-once when you are at a hospital or clinic, and once when you are not at a hospital or clinic. Record the average of the two measurements. To check your blood pressure when you are not at a hospital or clinic, you can use: ? An automated blood pressure machine at a pharmacy. ? A home blood pressure monitor.  If you are between 67 years and 94 years old, ask your health care provider if you should take aspirin to prevent strokes.  Have regular diabetes screenings. This involves taking a blood sample to check your fasting blood sugar level. ? If you are at a normal weight and have a low risk for diabetes, have this test once every three years after 66 years of age. ? If you are overweight and have a high risk for diabetes,  consider being tested at a younger age or more often. Preventing infection Hepatitis B  If you have a higher risk for hepatitis B, you should be screened for this virus. You are considered at high risk for hepatitis B if: ? You were born in a country where hepatitis B is common. Ask your health care provider which countries are considered high risk. ? Your parents were born in a high-risk country, and you have not been immunized against hepatitis B (hepatitis B vaccine). ? You have HIV or AIDS. ? You use needles to inject street drugs. ? You live with someone who has hepatitis B. ? You have had sex with someone who has hepatitis B. ? You get  hemodialysis treatment. ? You take certain medicines for conditions, including cancer, organ transplantation, and autoimmune conditions.  Hepatitis C  Blood testing is recommended for: ? Everyone born from [birth year] through 1965. ? Anyone with known risk factors for hepatitis C.  Sexually transmitted infections (STIs)  You should be screened for sexually transmitted infections (STIs) including gonorrhea and chlamydia if: ? You are sexually active and are younger than 66 years of age. ? You are older than 66 years of age and your health care provider tells you that you are at risk for this type of infection. ? Your sexual activity has changed since you were last screened and you are at an increased risk for chlamydia or gonorrhea. Ask your health care provider if you are at risk.  If you do not have HIV, but are at risk, it may be recommended that you take a prescription medicine daily to prevent HIV infection. This is called pre-exposure prophylaxis (PrEP). You are considered at risk if: ? You are sexually active and do not regularly use condoms or know the HIV status of your partner(s). ? You take drugs by injection. ? You are sexually active with a partner who has HIV.  Talk with your health care provider about whether you are at high risk of being infected with HIV. If you choose to begin PrEP, you should first be tested for HIV. You should then be tested every 3 months for as long as you are taking PrEP. Pregnancy  If you are premenopausal and you may become pregnant, ask your health care provider about preconception counseling.  If you may become pregnant, take 400 to 800 micrograms (mcg) of folic acid every day.  If you want to prevent pregnancy, talk to your health care provider about birth control (contraception). Osteoporosis and menopause  Osteoporosis is a disease in which the bones lose minerals and strength with aging. This can result in serious bone fractures. Your risk for  osteoporosis can be identified using a bone density scan.  If you are 28 years of age or older, or if you are at risk for osteoporosis and fractures, ask your health care provider if you should be screened.  Ask your health care provider whether you should take a calcium or vitamin D supplement to lower your risk for osteoporosis.  Menopause may have certain physical symptoms and risks.  Hormone replacement therapy may reduce some of these symptoms and risks. Talk to your health care provider about whether hormone replacement therapy is right for you. Follow these instructions at home:  Schedule regular health, dental, and eye exams.  Stay current with your immunizations.  Do not use any tobacco products including cigarettes, chewing tobacco, or electronic cigarettes.  If you are pregnant, do not drink alcohol.  If  you are breastfeeding, limit how much and how often you drink alcohol.  Limit alcohol intake to no more than 1 drink per day for nonpregnant women. One drink equals 12 ounces of beer, 5 ounces of wine, or 1 ounces of hard liquor.  Do not use street drugs.  Do not share needles.  Ask your health care provider for help if you need support or information about quitting drugs.  Tell your health care provider if you often feel depressed.  Tell your health care provider if you have ever been abused or do not feel safe at home. This information is not intended to replace advice given to you by your health care provider. Make sure you discuss any questions you have with your health care provider. Document Released: 06/04/2011 Document Revised: 04/26/2016 Document Reviewed: 08/23/2015 Elsevier Interactive Patient Education  [employer].

## 2017-08-15 NOTE — Progress Notes (Signed)
Patient ID: Christina Hardin, female   DOB: 20-Jan-1951, 66 y.o.   MRN: [id number] I have reviewed this visit and discussed with Howell Rucks, RN, BSN, and agree with her documentation.

## 2017-08-20 ENCOUNTER — Encounter (HOSPITAL_BASED_OUTPATIENT_CLINIC_OR_DEPARTMENT_OTHER)
Admission: RE | Admit: 2017-08-20 | Discharge: 2017-08-20 | Disposition: A | Discharge status: Home / Self Care | Payer: Medicare Other | Source: Ambulatory Visit | Attending: Orthopedic Surgery | Admitting: Orthopedic Surgery

## 2017-08-20 DIAGNOSIS — M19042 Primary osteoarthritis, left hand: Secondary | ICD-10-CM | POA: Diagnosis not present

## 2017-08-20 DIAGNOSIS — I10 Essential (primary) hypertension: Secondary | ICD-10-CM | POA: Diagnosis not present

## 2017-08-20 DIAGNOSIS — Z888 Allergy status to other drugs, medicaments and biological substances status: Secondary | ICD-10-CM | POA: Diagnosis not present

## 2017-08-20 DIAGNOSIS — Z79899 Other long term (current) drug therapy: Secondary | ICD-10-CM | POA: Diagnosis not present

## 2017-08-20 DIAGNOSIS — M19041 Primary osteoarthritis, right hand: Secondary | ICD-10-CM | POA: Diagnosis not present

## 2017-08-20 DIAGNOSIS — M069 Rheumatoid arthritis, unspecified: Secondary | ICD-10-CM | POA: Diagnosis not present

## 2017-08-20 DIAGNOSIS — G5602 Carpal tunnel syndrome, left upper limb: Secondary | ICD-10-CM | POA: Diagnosis not present

## 2017-08-20 DIAGNOSIS — F419 Anxiety disorder, unspecified: Secondary | ICD-10-CM | POA: Diagnosis not present

## 2017-08-20 DIAGNOSIS — K219 Gastro-esophageal reflux disease without esophagitis: Secondary | ICD-10-CM | POA: Diagnosis not present

## 2017-08-20 DIAGNOSIS — I4891 Unspecified atrial fibrillation: Secondary | ICD-10-CM | POA: Diagnosis not present

## 2017-08-20 DIAGNOSIS — G5622 Lesion of ulnar nerve, left upper limb: Secondary | ICD-10-CM | POA: Diagnosis not present

## 2017-08-20 DIAGNOSIS — Z791 Long term (current) use of non-steroidal anti-inflammatories (NSAID): Secondary | ICD-10-CM | POA: Diagnosis not present

## 2017-08-20 LAB — BASIC METABOLIC PANEL
ANION GAP: 4 — AB (ref 5–15)
BUN: 21 mg/dL — ABNORMAL HIGH (ref 6–20)
CALCIUM: 9.8 mg/dL (ref 8.9–10.3)
CHLORIDE: 108 mmol/L (ref 101–111)
CO2: 26 mmol/L (ref 22–32)
CREATININE: 0.85 mg/dL (ref 0.44–1.00)
GFR calc non Af Amer: >60 mL/min (ref >60)
Glucose, Bld: 91 mg/dL (ref 65–99)
Potassium: 3.2 mmol/L — ABNORMAL LOW (ref 3.5–5.1)
SODIUM: 138 mmol/L (ref 135–145)

## 2017-08-20 NOTE — Progress Notes (Signed)
Lab results reviewed by Dr. Lissa Hoard, will proceed with surgery as scheduled.

## 2017-08-22 ENCOUNTER — Ambulatory Visit (HOSPITAL_BASED_OUTPATIENT_CLINIC_OR_DEPARTMENT_OTHER): Payer: Medicare Other | Admitting: Anesthesiology

## 2017-08-22 ENCOUNTER — Encounter (HOSPITAL_BASED_OUTPATIENT_CLINIC_OR_DEPARTMENT_OTHER)
Admission: RE | Disposition: A | Discharge status: Home / Self Care | Payer: Self-pay | Source: Ambulatory Visit | Attending: Orthopedic Surgery

## 2017-08-22 ENCOUNTER — Ambulatory Visit (HOSPITAL_BASED_OUTPATIENT_CLINIC_OR_DEPARTMENT_OTHER)
Admission: RE | Admit: 2017-08-22 | Discharge: 2017-08-22 | Disposition: A | Discharge status: Home / Self Care | Payer: Medicare Other | Source: Ambulatory Visit | Attending: Orthopedic Surgery | Admitting: Orthopedic Surgery

## 2017-08-22 ENCOUNTER — Encounter (HOSPITAL_BASED_OUTPATIENT_CLINIC_OR_DEPARTMENT_OTHER): Payer: Self-pay | Admitting: *Deleted

## 2017-08-22 DIAGNOSIS — G5622 Lesion of ulnar nerve, left upper limb: Secondary | ICD-10-CM | POA: Diagnosis not present

## 2017-08-22 DIAGNOSIS — G5602 Carpal tunnel syndrome, left upper limb: Secondary | ICD-10-CM | POA: Diagnosis not present

## 2017-08-22 DIAGNOSIS — M069 Rheumatoid arthritis, unspecified: Secondary | ICD-10-CM | POA: Insufficient documentation

## 2017-08-22 DIAGNOSIS — I10 Essential (primary) hypertension: Secondary | ICD-10-CM | POA: Insufficient documentation

## 2017-08-22 DIAGNOSIS — K219 Gastro-esophageal reflux disease without esophagitis: Secondary | ICD-10-CM | POA: Insufficient documentation

## 2017-08-22 DIAGNOSIS — Z791 Long term (current) use of non-steroidal anti-inflammatories (NSAID): Secondary | ICD-10-CM | POA: Insufficient documentation

## 2017-08-22 DIAGNOSIS — I4891 Unspecified atrial fibrillation: Secondary | ICD-10-CM | POA: Diagnosis not present

## 2017-08-22 DIAGNOSIS — M19042 Primary osteoarthritis, left hand: Secondary | ICD-10-CM | POA: Insufficient documentation

## 2017-08-22 DIAGNOSIS — F419 Anxiety disorder, unspecified: Secondary | ICD-10-CM | POA: Diagnosis not present

## 2017-08-22 DIAGNOSIS — M19041 Primary osteoarthritis, right hand: Secondary | ICD-10-CM | POA: Insufficient documentation

## 2017-08-22 DIAGNOSIS — Z888 Allergy status to other drugs, medicaments and biological substances status: Secondary | ICD-10-CM | POA: Insufficient documentation

## 2017-08-22 DIAGNOSIS — Z79899 Other long term (current) drug therapy: Secondary | ICD-10-CM | POA: Insufficient documentation

## 2017-08-22 HISTORY — PX: ULNAR NERVE TRANSPOSITION: SHX2595

## 2017-08-22 HISTORY — PX: CARPOMETACARPEL SUSPENSION PLASTY: SHX5005

## 2017-08-22 HISTORY — PX: CARPAL TUNNEL RELEASE: SHX101

## 2017-08-22 SURGERY — CARPOMETACARPEL (CMC) SUSPENSION PLASTY
Anesthesia: General | Site: Hand | Laterality: Left

## 2017-08-22 MED ORDER — MIDAZOLAM HCL 2 MG/2ML IJ SOLN
INTRAMUSCULAR | Status: AC
  Filled 2017-08-22: qty 2

## 2017-08-22 MED ORDER — OXYCODONE-ACETAMINOPHEN 7.5-325 MG PO TABS: 1.0000 | ORAL_TABLET | ORAL | 0 refills | Status: DC | PRN

## 2017-08-22 MED ORDER — GLYCOPYRROLATE 0.2 MG/ML IV SOSY
PREFILLED_SYRINGE | INTRAVENOUS | Status: DC | PRN
  Administered 2017-08-22: .2 mg via INTRAVENOUS

## 2017-08-22 MED ORDER — ONDANSETRON HCL 4 MG/2ML IJ SOLN
INTRAMUSCULAR | Status: DC | PRN
  Administered 2017-08-22: 4 mg via INTRAVENOUS

## 2017-08-22 MED ORDER — SCOPOLAMINE 1 MG/3DAYS TD PT72: 1.0000 | MEDICATED_PATCH | Freq: Once | TRANSDERMAL | Status: DC | PRN

## 2017-08-22 MED ORDER — CHLORHEXIDINE GLUCONATE 4 % EX LIQD: 60.0000 mL | Freq: Once | CUTANEOUS | Status: DC

## 2017-08-22 MED ORDER — CEFAZOLIN SODIUM-DEXTROSE 2-4 GM/100ML-% IV SOLN
INTRAVENOUS | Status: AC
Start: 2017-08-22 — End: 2017-08-22
  Filled 2017-08-22: qty 100

## 2017-08-22 MED ORDER — ROPIVACAINE HCL 7.5 MG/ML IJ SOLN
INTRAMUSCULAR | Status: DC | PRN
  Administered 2017-08-22: 30 mL via PERINEURAL

## 2017-08-22 MED ORDER — MIDAZOLAM HCL 2 MG/2ML IJ SOLN
1.0000 mg | INTRAMUSCULAR | Status: DC | PRN
  Administered 2017-08-22: 2 mg via INTRAVENOUS

## 2017-08-22 MED ORDER — PROPOFOL 10 MG/ML IV BOLUS
INTRAVENOUS | Status: DC | PRN
  Administered 2017-08-22: 50 mg via INTRAVENOUS
  Administered 2017-08-22: 150 mg via INTRAVENOUS

## 2017-08-22 MED ORDER — FENTANYL CITRATE (PF) 100 MCG/2ML IJ SOLN
INTRAMUSCULAR | Status: AC
  Filled 2017-08-22: qty 2

## 2017-08-22 MED ORDER — LIDOCAINE 2% (20 MG/ML) 5 ML SYRINGE
INTRAMUSCULAR | Status: AC
  Filled 2017-08-22: qty 5

## 2017-08-22 MED ORDER — ONDANSETRON HCL 4 MG/2ML IJ SOLN
INTRAMUSCULAR | Status: AC
  Filled 2017-08-22: qty 2

## 2017-08-22 MED ORDER — LACTATED RINGERS IV SOLN
INTRAVENOUS | Status: DC
  Administered 2017-08-22: 10:00:00 via INTRAVENOUS

## 2017-08-22 MED ORDER — FENTANYL CITRATE (PF) 100 MCG/2ML IJ SOLN
50.0000 ug | INTRAMUSCULAR | Status: DC | PRN
  Administered 2017-08-22: 50 ug via INTRAVENOUS

## 2017-08-22 MED ORDER — LIDOCAINE 2% (20 MG/ML) 5 ML SYRINGE
INTRAMUSCULAR | Status: DC | PRN
  Administered 2017-08-22: 100 mg via INTRAVENOUS

## 2017-08-22 MED ORDER — DEXAMETHASONE SODIUM PHOSPHATE 4 MG/ML IJ SOLN
INTRAMUSCULAR | Status: DC | PRN
  Administered 2017-08-22: 10 mg via INTRAVENOUS

## 2017-08-22 MED ORDER — DEXAMETHASONE SODIUM PHOSPHATE 10 MG/ML IJ SOLN
INTRAMUSCULAR | Status: AC
  Filled 2017-08-22: qty 1

## 2017-08-22 MED ORDER — MEPIVACAINE HCL 1.5 % IJ SOLN
INTRAMUSCULAR | Status: DC | PRN
  Administered 2017-08-22: 10 mL via EPIDURAL

## 2017-08-22 MED ORDER — CEFAZOLIN SODIUM-DEXTROSE 2-4 GM/100ML-% IV SOLN
2.0000 g | INTRAVENOUS | Status: AC
  Administered 2017-08-22: 2 g via INTRAVENOUS

## 2017-08-22 SURGICAL SUPPLY — 63 items
BLADE MINI RND TIP GREEN BEAV (BLADE) ×4 IMPLANT
BLADE SURG 15 STRL LF DISP TIS (BLADE) ×3 IMPLANT
BLADE SURG 15 STRL SS (BLADE) ×3
BNDG CMPR 9X4 STRL LF SNTH (GAUZE/BANDAGES/DRESSINGS) ×1
BNDG COHESIVE 3X5 TAN STRL LF (GAUZE/BANDAGES/DRESSINGS) ×8 IMPLANT
BNDG ESMARK 4X9 LF (GAUZE/BANDAGES/DRESSINGS) ×4 IMPLANT
BNDG GAUZE ELAST 4 BULKY (GAUZE/BANDAGES/DRESSINGS) ×4 IMPLANT
CHLORAPREP W/TINT 26ML (MISCELLANEOUS) ×4 IMPLANT
CORD BIPOLAR FORCEPS 12FT (ELECTRODE) ×4 IMPLANT
COVER BACK TABLE 60X90IN (DRAPES) ×4 IMPLANT
COVER MAYO STAND STRL (DRAPES) ×4 IMPLANT
CUFF TOURN SGL LL 18 NRW (TOURNIQUET CUFF) ×4 IMPLANT
CUFF TOURNIQUET SINGLE 18IN (TOURNIQUET CUFF) IMPLANT
DECANTER SPIKE VIAL GLASS SM (MISCELLANEOUS) IMPLANT
DRAPE EXTREMITY T 121X128X90 (DRAPE) ×4 IMPLANT
DRAPE OEC MINIVIEW 54X84 (DRAPES) ×4 IMPLANT
DRAPE SURG 17X23 STRL (DRAPES) ×4 IMPLANT
DRSG PAD ABDOMINAL 8X10 ST (GAUZE/BANDAGES/DRESSINGS) ×4 IMPLANT
GAUZE SPONGE 4X4 12PLY STRL (GAUZE/BANDAGES/DRESSINGS) ×4 IMPLANT
GAUZE SPONGE 4X4 16PLY XRAY LF (GAUZE/BANDAGES/DRESSINGS) IMPLANT
GAUZE XEROFORM 1X8 LF (GAUZE/BANDAGES/DRESSINGS) ×4 IMPLANT
GLOVE BIO SURGEON STRL SZ 6.5 (GLOVE) ×4 IMPLANT
GLOVE BIOGEL PI IND STRL 7.0 (GLOVE) ×6 IMPLANT
GLOVE BIOGEL PI IND STRL 8 (GLOVE) ×3 IMPLANT
GLOVE BIOGEL PI IND STRL 8.5 (GLOVE) ×3 IMPLANT
GLOVE BIOGEL PI INDICATOR 7.0 (GLOVE) ×2
GLOVE BIOGEL PI INDICATOR 8 (GLOVE) ×1
GLOVE BIOGEL PI INDICATOR 8.5 (GLOVE) ×1
GLOVE SURG ORTHO 8.0 STRL STRW (GLOVE) ×4 IMPLANT
GOWN STRL REUS W/ TWL LRG LVL3 (GOWN DISPOSABLE) ×3 IMPLANT
GOWN STRL REUS W/TWL LRG LVL3 (GOWN DISPOSABLE) ×2
GOWN STRL REUS W/TWL XL LVL3 (GOWN DISPOSABLE) ×8 IMPLANT
LOOP VESSEL MAXI BLUE (MISCELLANEOUS) IMPLANT
NEEDLE PRECISIONGLIDE 27X1.5 (NEEDLE) IMPLANT
NS IRRIG 1000ML POUR BTL (IV SOLUTION) ×4 IMPLANT
PACK BASIN DAY SURGERY FS (CUSTOM PROCEDURE TRAY) ×4 IMPLANT
PAD CAST 3X4 CTTN HI CHSV (CAST SUPPLIES) ×3 IMPLANT
PAD CAST 4YDX4 CTTN HI CHSV (CAST SUPPLIES) ×3 IMPLANT
PADDING CAST ABS 3INX4YD NS (CAST SUPPLIES)
PADDING CAST ABS COTTON 3X4 (CAST SUPPLIES) IMPLANT
PADDING CAST COTTON 3X4 STRL (CAST SUPPLIES) ×3
PADDING CAST COTTON 4X4 STRL (CAST SUPPLIES) ×4
SLEEVE SCD COMPRESS KNEE MED (MISCELLANEOUS) ×4 IMPLANT
SPLINT PLASTER CAST XFAST 3X15 (CAST SUPPLIES) IMPLANT
SPLINT PLASTER XTRA FASTSET 3X (CAST SUPPLIES)
STOCKINETTE 4X48 STRL (DRAPES) ×4 IMPLANT
SUT ETHIBOND 3-0 V-5 (SUTURE) ×4 IMPLANT
SUT ETHILON 4 0 PS 2 18 (SUTURE) ×8 IMPLANT
SUT FIBERWIRE 4-0 18 DIAM BLUE (SUTURE) ×4
SUT MERSILENE 4 0 P 3 (SUTURE) IMPLANT
SUT STEEL 3 0 (SUTURE) ×4 IMPLANT
SUT VIC AB 2-0 SH 27 (SUTURE) ×2
SUT VIC AB 2-0 SH 27XBRD (SUTURE) ×3 IMPLANT
SUT VIC AB 4-0 P-3 18XBRD (SUTURE) IMPLANT
SUT VIC AB 4-0 P2 18 (SUTURE) ×4 IMPLANT
SUT VIC AB 4-0 P3 18 (SUTURE)
SUT VICRYL 4-0 PS2 18IN ABS (SUTURE) ×4 IMPLANT
SUTURE FIBERWR 4-0 18 DIA BLUE (SUTURE) ×3 IMPLANT
SYR BULB 3OZ (MISCELLANEOUS) ×4 IMPLANT
SYR CONTROL 10ML LL (SYRINGE) IMPLANT
TOWEL OR 17X24 6PK STRL BLUE (TOWEL DISPOSABLE) ×8 IMPLANT
TOWEL OR NON WOVEN STRL DISP B (DISPOSABLE) ×4 IMPLANT
UNDERPAD 30X30 (UNDERPADS AND DIAPERS) ×4 IMPLANT

## 2017-08-22 NOTE — Brief Op Note (Signed)
08/22/2017  1:43 PM  PATIENT:  Christina Hardin  66 y.o. female  PRE-OPERATIVE DIAGNOSIS:  CARPOMETACARPAL ARTHRITIS LEFT THUMB,CARPAL TUNNEL SYNDROME,CUBITAL TUNNEL LEFT  POST-OPERATIVE DIAGNOSIS:  CARPOMETACARPAL ARTHRITIS LEFT THUMB,CARPAL TUNNEL SYNDROME,CUBITAL TUNNEL   PROCEDURE:  Procedure(s): SUSPENSIONPLASTY LEFT THUMB TRAPENZIUM EXCISION ABDUCTOR POLLICIS LONGUS (Left) LEFT CARPAL TUNNEL RELEASE (Left) ULNAR NERVE DECOMPRESSION (Left)  SURGEON:  Surgeon(s) and Role:    Daryll Brod, MD - Primary  PHYSICIAN ASSISTANT:   ASSISTANTS: Christina Elif Yonts,MD   ANESTHESIA:   regional and IV sedation  EBL:  Total I/O In: -  Out: 5 [Blood:5]  BLOOD ADMINISTERED:none  DRAINS: none   LOCAL MEDICATIONS USED:  NONE  SPECIMEN:  No Specimen  DISPOSITION OF SPECIMEN:  N/A  COUNTS:  YES  TOURNIQUET:   Total Tourniquet Time Documented: Upper Arm (Left) - 89 minutes Total: Upper Arm (Left) - 89 minutes   DICTATION: .Other Dictation: Dictation Number [id number]  PLAN OF CARE: Discharge to home after PACU  PATIENT DISPOSITION:  PACU - hemodynamically stable.

## 2017-08-22 NOTE — Anesthesia Preprocedure Evaluation (Signed)
Anesthesia Evaluation  Patient identified by MRN, date of birth, ID band Patient awake    Reviewed: Allergy & Precautions, NPO status , Patient's Chart, lab work & pertinent test results  Airway Mallampati: II  TM Distance: >3 FB Neck ROM: Full    Dental no notable dental hx.    Pulmonary sleep apnea ,    Pulmonary exam normal breath sounds clear to auscultation       Cardiovascular hypertension, + Peripheral Vascular Disease  Normal cardiovascular exam Rhythm:Regular Rate:Normal  Echo 06/2016 - Left ventricle: The cavity size was normal. Wall thickness was normal. Systolic function was normal. The estimated ejection fraction was in the range of 55% to 60%. Wall motion was normal; there were no regional wall motion abnormalities. Features are consistent with a pseudonormal left ventricular filling pattern, with concomitant abnormal relaxation and increased filling pressure (grade 2 diastolic dysfunction).   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    Neuro/Psych  Headaches, PSYCHIATRIC DISORDERS Anxiety    GI/Hepatic negative GI ROS, Neg liver ROS, GERD  ,  Endo/Other  negative endocrine ROS  Renal/GU Renal diseasenegative Renal ROS     Musculoskeletal  (+) Arthritis , Rheumatoid disorders,    Abdominal (+) + obese,   Peds  Hematology  (+) anemia ,   Anesthesia Other Findings   Reproductive/Obstetrics negative OB ROS                             Anesthesia Physical  Anesthesia Plan  ASA: III  Anesthesia Plan: General   Post-op Pain Management:    Induction: Intravenous  PONV Risk Score and Plan: 3 and Ondansetron, Dexamethasone and Midazolam  Airway Management Planned: LMA  Additional Equipment:   Intra-op Plan:   Post-operative Plan: Extubation in OR  Informed Consent: I have reviewed the patients History and Physical, chart, labs and discussed the procedure including the risks,  benefits and alternatives for the proposed anesthesia with the patient or authorized representative who has indicated his/her understanding and acceptance.   Dental advisory given  Plan Discussed with: CRNA  Anesthesia Plan Comments:         Anesthesia Quick Evaluation

## 2017-08-22 NOTE — Progress Notes (Signed)
Assisted Dr. Germeroth with left, ultrasound guided, axillary block. Side rails up, monitors on throughout procedure. See vital signs in flow sheet. Tolerated Procedure well. 

## 2017-08-22 NOTE — Transfer of Care (Signed)
Immediate Anesthesia Transfer of Care Note  Patient: MATA ROWEN  Procedure(s) Performed: Procedure(s): SUSPENSIONPLASTY LEFT THUMB TRAPENZIUM EXCISION ABDUCTOR POLLICIS LONGUS (Left) LEFT CARPAL TUNNEL RELEASE (Left) ULNAR NERVE DECOMPRESSION (Left)  Patient Location: PACU  Anesthesia Type:General and Regional  Level of Consciousness: awake and sedated  Airway & Oxygen Therapy: Patient Spontanous Breathing and Patient connected to face mask oxygen  Post-op Assessment: Report given to RN and Post -op Vital signs reviewed and stable  Post vital signs: Reviewed and stable  Last Vitals:  Vitals:   08/22/17 1115 08/22/17 1347  BP: (!) 159/101   Pulse: (!) 57 60  Resp: 16   Temp:    SpO2: 100% 98%    Last Pain:  Vitals:   08/22/17 0949  TempSrc: Oral         Complications: No apparent anesthesia complications

## 2017-08-22 NOTE — Anesthesia Postprocedure Evaluation (Signed)
Anesthesia Post Note  Patient: Christina Hardin  Procedure(s) Performed: Procedure(s) (LRB): SUSPENSIONPLASTY LEFT THUMB TRAPENZIUM EXCISION ABDUCTOR POLLICIS LONGUS (Left) LEFT CARPAL TUNNEL RELEASE (Left) ULNAR NERVE DECOMPRESSION (Left)     Patient location during evaluation: PACU Anesthesia Type: General Level of consciousness: sedated and patient cooperative Pain management: pain level controlled Vital Signs Assessment: post-procedure vital signs reviewed and stable Respiratory status: spontaneous breathing Cardiovascular status: stable Anesthetic complications: no    Last Vitals:  Vitals:   08/22/17 1430 08/22/17 1449  BP: (!) 150/90 (!) 160/86  Pulse: (!) 54 (!) 59  Resp: 16 18  Temp:  36.6 C  SpO2: 95% 95%    Last Pain:  Vitals:   08/22/17 1449  TempSrc: Oral  PainSc: 0-No pain                 Nolon Nations

## 2017-08-22 NOTE — Discharge Instructions (Addendum)
° °  ° ° ° °[facility] Center Instructions °Hand Surgery ° °Wound Care: °Keep your hand elevated above the level of your heart.  Do not allow it to dangle by your side.  Keep the dressing dry and do not remove it unless your doctor advises you to do so.  He will usually change it at the time of your post-op visit.  Moving your fingers is advised to stimulate circulation but will depend on the site of your surgery.  If you have a splint applied, your doctor will advise you regarding movement. ° °Activity: °Do not drive or operate machinery today.  Rest today and then you may return to your normal activity and work as indicated by your physician. ° °Diet:  °Drink liquids today or eat a light diet.  You may resume a regular diet tomorrow.   ° °General expectations: °Pain for two to three days. °Fingers may become slightly swollen. ° °Call your doctor if any of the following occur: °Severe pain not relieved by pain medication. °Elevated temperature. °Dressing soaked with blood. °Inability to move fingers. °White or bluish color to fingers. ° ° °Regional Anesthesia Blocks ° °1. Numbness or the inability to move the "blocked" extremity may last from 3-48 hours after placement. The length of time depends on the medication injected and your individual response to the medication. If the numbness is not going away after 48 hours, call your surgeon. ° °2. The extremity that is blocked will need to be protected until the numbness is gone and the  Strength has returned. Because you cannot feel it, you will need to take extra care to avoid injury. Because it may be weak, you may have difficulty moving it or using it. You may not know what position it is in without looking at it while the block is in effect. ° °3. For blocks in the legs and feet, returning to weight bearing and walking needs to be done carefully. You will need to wait until the numbness is entirely gone and the strength has returned. You should be able to move your leg  and foot normally before you try and bear weight or walk. You will need someone to be with you when you first try to ensure you do not fall and possibly risk injury. ° °4. Bruising and tenderness at the needle site are common side effects and will resolve in a few days. ° °5. Persistent numbness or new problems with movement should be communicated to the surgeon or the [facility] ([phone])/ [facility] ([phone]). ° ° ° °Post Anesthesia Home Care Instructions ° °Activity: °Get plenty of rest for the remainder of the day. A responsible individual must stay with you for 24 hours following the procedure.  °For the next 24 hours, DO NOT: °-Drive a car °-Operate machinery °-Drink alcoholic beverages °-Take any medication unless instructed by your physician °-Make any legal decisions or sign important papers. ° °Meals: °Start with liquid foods such as gelatin or soup. Progress to regular foods as tolerated. Avoid greasy, spicy, heavy foods. If nausea and/or vomiting occur, drink only clear liquids until the nausea and/or vomiting subsides. Call your physician if vomiting continues. ° °Special Instructions/Symptoms: °Your throat may feel dry or sore from the anesthesia or the breathing tube placed in your throat during surgery. If this causes discomfort, gargle with warm salt water. The discomfort should disappear within 24 hours. ° °If you had a scopolamine patch placed behind your ear for   the management of post- operative nausea and/or vomiting: ° °1. The medication in the patch is effective for 72 hours, after which it should be removed.  Wrap patch in a tissue and discard in the trash. Wash hands thoroughly with soap and water. °2. You may remove the patch earlier than 72 hours if you experience unpleasant side effects which may include dry mouth, dizziness or visual disturbances. °3. Avoid touching the patch. Wash your hands with soap and water after contact with the patch. °  ° ° °

## 2017-08-22 NOTE — Anesthesia Procedure Notes (Signed)
Procedure Name: LMA Insertion Date/Time: 08/22/2017 11:59 AM Performed by: Lyndee Leo Pre-anesthesia Checklist: Patient identified, Emergency Drugs available, Suction available and Patient being monitored Patient Re-evaluated:Patient Re-evaluated prior to induction Oxygen Delivery Method: Circle system utilized Preoxygenation: Pre-oxygenation with 100% oxygen Induction Type: IV induction Ventilation: Mask ventilation without difficulty LMA: LMA inserted LMA Size: 4.0 Number of attempts: 1 Airway Equipment and Method: Bite block Placement Confirmation: positive ETCO2 Tube secured with: Tape Dental Injury: Teeth and Oropharynx as per pre-operative assessment

## 2017-08-22 NOTE — Op Note (Signed)
Christina Hardin, Christina Hardin               ACCOUNT NO.:  [id number]  MEDICAL RECORD NO.:  [id number]  LOCATION:                                 FACILITY:  PHYSICIAN:  Daryll Brod, M.D.            DATE OF BIRTH: ASSISTANT: Richardo Priest, M.D. DATE OF PROCEDURE:  08/22/2017 DATE OF DISCHARGE:                              OPERATIVE REPORT   PREOPERATIVE DIAGNOSES:  Carpometacarpal arthritis, carpal tunnel syndrome, cubital tunnel syndrome, left arm.  POSTOPERATIVE DIAGNOSES:  Carpometacarpal arthritis, carpal tunnel syndrome, cubital tunnel syndrome, left arm.  OPERATIONS:  Carpal tunnel release with decompression ulnar nerve at the elbow and trapezium excision with abduct pollicis longus tendon transfer, left hand.  SURGEON:  Daryll Brod, MD.  ASSISTANT:  Leanora Cover, MD.  ANESTHESIA:  Supraclavicular block with general.  PLACE OF SURGERY:  [facility].  In preoperative area, the patient is seen, the extremity marked by both patient and surgeon, antibiotic given.  DESCRIPTION OF PROCEDURE:  The patient was brought to the operating room where a supraclavicular block was carried out in the preoperative area under the direction of the Anesthesia Department.  General anesthetic was carried out in the operating room.  She was prepped in a supine position with the left arm free using ChloraPrep.  A 3-minute dry time was allowed.  Time-out taken confirming patient and procedure.  The elbow was approached first.  The limb was exsanguinated with an Esmarch bandage.  Tourniquet placed high on the arm was inflated to 250 mmHg.  A straight incision was made over the medial epicondyle of the left elbow, carried down through subcutaneous tissue.  Bleeders were electrocauterized with bipolar.  Dissection was carried to the posterior aspect of Osborne's fascia.  This was incised.  This revealed the ulnar nerve.  Bleeders were electrocauterized as necessary with bipolar.  The flexor carpi  ulnaris muscle belly was identified distally.  The subcutaneous tissue was dissected free from its overlying fascia.  The superficial fascia was then dissected free and separated along with the dorsal palmar muscle belly.  A KMI guide for carpal tunnel release was then placed protecting the ulnar nerve distally and the deep fascia. The flexor carpi ulnaris was then released for approximately 8 centimeters distal to the elbow using angled ENT straight scissors. Attention was directed proximally.  The brachia fascia was then dissected free from the overlying subcutaneous tissue and skin.  A knee retractor was placed as was done distally retracting the superficial tissues allowing visualization of the brachial fascia.  The [facility] guide was then placed between the nerve and fascia proximally.  The EMT scissors were then used to dissect the fascia and separate this allowing decompression of the nerve proximally and distally.  The elbow was fully flexed.  No subluxation to the nerve was apparent.  The wound was copiously irrigated with saline.  The Osborne fascia was then sutured to the posterior skin flap with 2-0 Vicryl sutures.  The subcutaneous tissue was closed with interrupted 4-0 Vicryl, the skin with interrupted 4-0 nylon sutures.  The carpal tunnel was attended to next.  A longitudinal incision was made in the  left palm, carried down through subcutaneous tissue.  Bleeders were again electrocauterized.  The palmar fascia was split.  The superficial palmar arch was identified along with the flexor tendon to the ring and little finger.  Retractors were placed retracting the median nerve, flexor tendons radially and the ulnar nerve ulnarly.  The flexor retinaculum was then incised on its ulnar border with sharp dissection, coagulating blood vessels as necessary.  A Sewall retractor along with right angle retractor was placed between skin and forearm fascia.  The nerve dissected from the  overlying fascia with blunt dissection proximally.  The proximal aspect of the flexor retinaculum, distal forearm fascia was then separated using blunt scissors.  This was done for approximately 2 cm proximal to the wrist crease under direct vision.  The canal was explored.  An area of compression to the nerve was apparent.  Motor branch entered into muscle distally.  The wound was irrigated and closed with interrupted 4-0 nylon sutures.  A separate incision was then made.  Hockey stick over the first dorsal extensor compartment carried dorsally over the base of the thumb metacarpal.  This was carried down through subcutaneous tissue. The radial sensory nerves were identified and protected.  The interval between the abductor pollicis longus and extensor pollicis brevis was then incised with a Beaver blade.  A large osteophyte was then immediately extruded and removed.  The joint was inspected, found to be entirely devoid of cartilage.  The periosteum was then elevated off from the trapezium.  Dissection proximally identified the radial artery and this was protected throughout the procedure.  The dissection was then carried proximally to the STT joint.  The periosteum elevated over the trapezium dorsally, palmarly and this was removed with a rongeur in pieces.  The bone was extremely large and showed considerable degeneration both on the proximal and distal surfaces.  The trapezoid was left intact.  A drill hole was then placed in the base of the thumb metacarpal and dorsal to palmar direction distal to proximal.  This was done with 7/64 inch drill bit.  A second hole was drilled between the base of the index metacarpal in a volar to dorsal direction exiting at the webspace.  The most dorsal aspect of the abductors pollicis longus was isolated.  This was left attached to the base of the thumb metacarpal. A separate incision was made proximally over the musculotendinous junction.  A Carroll  tendon passer was then used to the retrieve a monofilament wire distally.  This was then used as a cheese cutter to attach the most dorsal aspect of the abducted pollicis longus which had been isolated from the musculotendinous junction proximally.  This wound was irrigated and closed with interrupted 4-0 nylon sutures.  Separate incision was then made over the egress of the drill hole in the dorsal aspect of the hand in the second interspace between the index middle metacarpal.  This allowed visualization of the drill hole, which had been made.  The abductor pollicis longus tendon was then passed through the base of the thumb metacarpal in a dorsal to palmar direction and then through the base of the index metacarpal in a volar to dorsal direction.  Hemostat was then used to dissect subperiosteal along the margin of the index metacarpal and the tendon was then brought back into the defect of the trapezium.  This was done just proximal to the extensor carpi radialis longus tendon.  This was then sutured to the extensor pollicis longus tendon  with figure-of-eight 3-0 Ethibond sutures.  The remainder of the tendon was then brought to the junction of the abductor pollicis longus passing between the base of the thumb and index metacarpal and also sutured into position with figure-of-eight 4-0 Ethibond sutures.  This firmly stabilized the thumb from the index metacarpal.  With our compression, it did not reach the distal aspect of the scaphoid.  X-rays in AP and lateral direction confirmed removal of the trapezium with adequate suspension from the index metacarpal.  The wound was irrigated.  The capsule was closed as much as possible with figure-of-eight 4-0 Vicryl sutures.  The subcutaneous tissue with interrupted 4-0 Vicryl and skin with interrupted 4-0 nylon sutures.  A sterile compressive dressing to the forearm, elbow, carpal tunnel was then applied.  A splint was applied to the thumb in  both dorsal and palmar directions making this a thumb spica splint.  On deflation of the tourniquet, all fingers immediately pinked.  She was taken to the recovery room for observation in satisfactory condition.  She will be discharged to home, to return the [place] in 1 week, on Percocet.          ______________________________ Daryll Brod, M.D.     GK/MEDQ  D:  08/22/2017  T:  08/22/2017  Job:  [id number]

## 2017-08-22 NOTE — Anesthesia Procedure Notes (Signed)
Anesthesia Regional Block: Axillary brachial plexus block   Pre-Anesthetic Checklist: ,, timeout performed, Correct Patient, Correct Site, Correct Laterality, Correct Procedure, Correct Position, site marked, Risks and benefits discussed,  Surgical consent,  Pre-op evaluation,  At surgeon's request and post-op pain management  Laterality: Left  Prep: chloraprep       Needles:  Injection technique: Single-shot  Needle Type: Stimiplex          Additional Needles:   Procedures:,,,, ultrasound used (permanent image in chart),,,,  Narrative:  Start time: 08/22/2017 11:10 AM End time: 08/22/2017 11:17 AM Injection made incrementally with aspirations every 5 mL.  Performed by: Personally  Anesthesiologist: Nolon Nations  Additional Notes: Risks, benefits and alternative to block explained extensively.  Patient tolerated procedure well, without complications.

## 2017-08-22 NOTE — Op Note (Signed)
Dictation Number [id number]
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
## 2017-08-23 ENCOUNTER — Encounter (HOSPITAL_BASED_OUTPATIENT_CLINIC_OR_DEPARTMENT_OTHER): Payer: Self-pay | Admitting: Orthopedic Surgery

## 2017-08-23 NOTE — Op Note (Signed)
I assisted Surgeon(s) and Role:    * Daryll Brod, MD - Primary on the Procedure(s): SUSPENSIONPLASTY LEFT THUMB TRAPENZIUM EXCISION ABDUCTOR POLLICIS LONGUS LEFT CARPAL TUNNEL RELEASE ULNAR NERVE DECOMPRESSION on 08/22/2017.  I provided assistance on this case as follows: retraction soft tissues, harvest of tendon graft, passing of tendon graft, closure of wounds.  Electronically signed by: Tennis Must, MD Date: 08/23/2017 Time: 11:14 PM

## 2017-09-16 ENCOUNTER — Encounter: Payer: Self-pay | Admitting: Family Medicine

## 2017-09-16 ENCOUNTER — Ambulatory Visit (INDEPENDENT_AMBULATORY_CARE_PROVIDER_SITE_OTHER): Payer: Medicare Other | Admitting: Family Medicine

## 2017-09-16 VITALS — BP 110/80 | HR 64 | Temp 98.3°F | Ht 64.5 in | Wt 180.0 lb

## 2017-09-16 DIAGNOSIS — R21 Rash and other nonspecific skin eruption: Secondary | ICD-10-CM | POA: Diagnosis not present

## 2017-09-16 MED ORDER — TRIAMCINOLONE ACETONIDE 0.1 % EX CREA: 1.0000 "application " | TOPICAL_CREAM | Freq: Two times a day (BID) | CUTANEOUS | 0 refills | Status: DC

## 2017-09-16 MED ORDER — HYDROXYZINE HCL 10 MG PO TABS: 10.0000 mg | ORAL_TABLET | Freq: Three times a day (TID) | ORAL | 0 refills | Status: DC | PRN

## 2017-09-16 NOTE — Assessment & Plan Note (Signed)
4 day pruritic papular rash with extensive erythematous bases resembling welts. With location of rash being commonly exposed areas of the arms and legs, think more likely exposure-related cause like contact or insect bite hypersensitivity. Isolated welts resembling erythema nodosum but less likely as patient not on any immunomodulator therapy or preexisting autoimmune condition. Either way, would treat symptomatically. - triamcinolone cream BID PRN - Atarax TID PRN itching - if worsens or no better after one week, return to clinic for reevaluation
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
## 2017-09-16 NOTE — Patient Instructions (Signed)
It was great to see you!  For your rash,  - It is most likely a hypersensitivity or exaggerated reaction to a bug bite - I am prescribing a higher potency steroid cream and antihistamine for the itching. - It should go away in a week. If it doesn't or gets worse, you should be seen to reevaluate the rash.  Take care and seek immediate care sooner if you develop any concerns.   Rory Percy, DO [facility]

## 2017-10-01 ENCOUNTER — Ambulatory Visit
Admission: RE | Admit: 2017-10-01 | Discharge: 2017-10-01 | Disposition: A | Discharge status: Home / Self Care | Payer: Medicare Other | Source: Ambulatory Visit | Attending: Adult Health | Admitting: Adult Health

## 2017-10-01 DIAGNOSIS — D0512 Intraductal carcinoma in situ of left breast: Secondary | ICD-10-CM

## 2017-10-01 HISTORY — DX: Personal history of antineoplastic chemotherapy: Z92.21

## 2017-10-03 ENCOUNTER — Ambulatory Visit (HOSPITAL_BASED_OUTPATIENT_CLINIC_OR_DEPARTMENT_OTHER): Payer: Medicare Other | Admitting: Hematology and Oncology

## 2017-10-03 ENCOUNTER — Telehealth: Payer: Self-pay | Admitting: Hematology and Oncology

## 2017-10-03 ENCOUNTER — Encounter: Payer: Self-pay | Admitting: Hematology and Oncology

## 2017-10-03 DIAGNOSIS — D0512 Intraductal carcinoma in situ of left breast: Secondary | ICD-10-CM | POA: Diagnosis not present

## 2017-10-03 DIAGNOSIS — R21 Rash and other nonspecific skin eruption: Secondary | ICD-10-CM | POA: Diagnosis not present

## 2017-10-03 MED ORDER — TAMOXIFEN CITRATE 20 MG PO TABS: 20.0000 mg | ORAL_TABLET | Freq: Every day | ORAL | 3 refills | Status: DC

## 2017-10-03 NOTE — Assessment & Plan Note (Signed)
10/09/2016: Left lumpectomy: DCIS with calcifications and focal necrosis, grade 2, margins negative, 0/1 lymph node negative, ER 100%, PR 95%, Tis N0 stage 0 Left breast MRI 09/17/2016: 2.6 x 4.2 x 1.4 cm area of enhancement at left breast 2:00 position  Adj XRT: 11/27/16 to 12/25/16 Plan: Started Tamoxifen 01/09/17 stopped March 2018 for rash Patient developed rash in spite of stopping tamoxifen. 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   Return to clinic in 1 year for follow-up

## 2017-10-03 NOTE — Telephone Encounter (Signed)
Gave patient avs and calendar with appt per 11/1 los.

## 2017-10-03 NOTE — Progress Notes (Signed)
Patient Care Team: Zenia Resides, MD as PCP - General Nicholas Lose, MD as Consulting Physician (Hematology and Oncology) Irene Limbo, MD as Consulting Physician (Plastic Surgery) Kyung Rudd, MD as Consulting Physician (Radiation Oncology) Delice Bison Charlestine Massed, NP as Nurse Practitioner (Hematology and Oncology) Donnie Mesa, MD as Consulting Physician (General Surgery) Debara Pickett Nadean Corwin, MD as Consulting Physician (Cardiology)  DIAGNOSIS:  Encounter Diagnosis  Name Primary?  . Ductal carcinoma in situ (DCIS) of left breast     SUMMARY OF ONCOLOGIC HISTORY:   DCIS (ductal carcinoma in situ) of breast   10/09/2016 Surgery    Left lumpectomy: DCIS with calcifications and focal necrosis, grade 2, margins negative, 0/1 lymph node negative, ER 100%, PR 95%, Tis N0 stage 0      11/27/2016 - 12/27/2016 Radiation Therapy    Adjuvant radiation (moody): Left Breast was treated to 42.5 Gy in 17 fractions, and then Boosted an additional 10 Gy in 4 fractions.      04/2017 -  Anti-estrogen oral therapy    Tamoxifen 20 mg daily       CHIEF COMPLIANT: Follow-up on tamoxifen therapy  INTERVAL HISTORY: Christina Hardin is a 66 year old with above-mentioned history of left breast cancer treated with lumpectomy radiation and is currently on tamoxifen therapy.  Initially she felt that the skin rash that she was having was related to tamoxifen but did not get better even after stopping tamoxifen.  So she resumed tamoxifen and apparently is doing quite well.  She notes she still has a rash but it is getting treated by dermatology.  They believe that it may be a fungal infection.  She denies any hot flashes or myalgias.  She had a recent carpal tunnel surgery and is recovering from that.  REVIEW OF SYSTEMS:   Constitutional: Denies fevers, chills or abnormal weight loss Eyes: Denies blurriness of vision Ears, nose, mouth, throat, and face: Denies mucositis or sore throat Respiratory:  Denies cough, dyspnea or wheezes Cardiovascular: Denies palpitation, chest discomfort Gastrointestinal:  Denies nausea, heartburn or change in bowel habits Skin: Facial skin rash Lymphatics: Denies new lymphadenopathy or easy bruising Neurological:Denies numbness, tingling or new weaknesses Behavioral/Psych: Mood is stable, no new changes  Extremities: Carpal tunnel surgery recently  All other systems were reviewed with the patient and are negative.  I have reviewed the past medical history, past surgical history, social history and family history with the patient and they are unchanged from previous note.  ALLERGIES:  is allergic to dilaudid [hydromorphone hcl] and propoxyphene hcl.  MEDICATIONS:  Current Outpatient Prescriptions  Medication Sig Dispense Refill  . acetaminophen (TYLENOL) 500 MG tablet Take 1,000 mg by mouth every 6 (six) hours as needed for headache (pain).    . Cyanocobalamin 2500 MCG CHEW Chew 2,500 mcg by mouth daily. Vitamin B12    . ELIQUIS 5 MG TABS tablet TAKE 1 TABLET BY MOUTH 2 TIMES DAILY. (Patient taking differently: TAKE 1 TABLET (5 MG) BY MOUTH 2 TIMES DAILY.) 180 tablet 1  . hydrochlorothiazide (HYDRODIURIL) 12.5 MG tablet Take 1 tablet (12.5 mg total) by mouth daily. 90 tablet 3  . metoprolol succinate (TOPROL-XL) 25 MG 24 hr tablet TAKE 1 TABLET BY MOUTH DAILY. 90 tablet 3  . metroNIDAZOLE (METROGEL) 1 % gel Apply topically daily. (Patient taking differently: Apply 1 application topically daily as needed (rosacea). ) 45 g 0  . Multiple Vitamins-Minerals (ADULT ONE DAILY GUMMIES) CHEW Chew 2 tablets by mouth daily with lunch.    Marland Kitchen  Potassium 99 MG TABS Take 99 mg by mouth daily.    . tamoxifen (NOLVADEX) 20 MG tablet Take 1 tablet (20 mg total) by mouth daily. 90 tablet 3  . triamcinolone cream (KENALOG) 0.1 % Apply 1 application topically 2 (two) times daily. 30 g 0   No current facility-administered medications for this visit.     PHYSICAL  EXAMINATION: ECOG PERFORMANCE STATUS: 1 - Symptomatic but completely ambulatory  Vitals:   10/03/17 1058  BP: 133/86  Pulse: 62  Resp: 17  Temp: 98 F (36.7 C)  SpO2: 98%   Filed Weights   10/03/17 1058  Weight: 185 lb 6.4 oz (84.1 kg)    GENERAL:alert, no distress and comfortable SKIN: Skin rash on the face EYES: normal, Conjunctiva are pink and non-injected, sclera clear OROPHARYNX:no exudate, no erythema and lips, buccal mucosa, and tongue normal  NECK: supple, thyroid normal size, non-tender, without nodularity LYMPH:  no palpable lymphadenopathy in the cervical, axillary or inguinal LUNGS: clear to auscultation and percussion with normal breathing effort HEART: regular rate & rhythm and no murmurs and no lower extremity edema ABDOMEN:abdomen soft, non-tender and normal bowel sounds MUSCULOSKELETAL:no cyanosis of digits and no clubbing  NEURO: alert & oriented x 3 with fluent speech, no focal motor/sensory deficits EXTREMITIES: No lower extremity edema  LABORATORY DATA:  I have reviewed the data as listed   Chemistry      Component Value Date/Time   NA 138 08/20/2017 0918   NA 144 02/14/2017 1235   K 3.2 (L) 08/20/2017 0918   CL 108 08/20/2017 0918   CO2 26 08/20/2017 0918   BUN 21 (H) 08/20/2017 0918   BUN 22 02/14/2017 1235   CREATININE 0.85 08/20/2017 0918   CREATININE 0.98 05/24/2016 1105      Component Value Date/Time   CALCIUM 9.8 08/20/2017 0918   ALKPHOS 78 07/05/2017 2017   AST 29 07/05/2017 2017   ALT 16 07/05/2017 2017   BILITOT 0.6 07/05/2017 2017       Lab Results  Component Value Date   WBC 7.6 07/05/2017   HGB 11.5 (L) 07/05/2017   HCT 35.6 (L) 07/05/2017   MCV 84.2 07/05/2017   PLT 237 07/05/2017   NEUTROABS 2.4 08/15/2008    ASSESSMENT & PLAN:  DCIS (ductal carcinoma in situ) of breast 10/09/2016: Left lumpectomy: DCIS with calcifications and focal necrosis, grade 2, margins negative, 0/1 lymph node negative, ER 100%, PR 95%, Tis  N0 stage 0 Left breast MRI 09/17/2016: 2.6 x 4.2 x 1.4 cm area of enhancement at left breast 2:00 position  Adj XRT: 11/27/16 to 12/25/16 Plan: Started Tamoxifen 01/09/17 stopped March 2018 for rash, restarted April 2018 when she felt that the rash was due to tamoxifen 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 Tolerating extremely well without any hot flashes. Patient has had chronic osteoarthritis and recently had a carpal tunnel surgery. I sent a new prescription for tamoxifen for 90-day supply.  Mammogram 10/01/2017 was normal.  Return to clinic in 1 year for follow-up   I spent 25 minutes talking to the patient of which more than half was spent in counseling and coordination of care.  No orders of the defined types were placed in this encounter.  The patient has a good understanding of the overall plan. she agrees with it. she will call with any problems that may develop before the next visit here.   Rulon Eisenmenger, MD 10/03/17

## 2017-11-27 ENCOUNTER — Other Ambulatory Visit: Payer: Self-pay | Admitting: Internal Medicine

## 2017-12-27 IMAGING — MG 2D DIGITAL DIAGNOSTIC BILATERAL MAMMOGRAM WITH CAD AND ADJUNCT T
8 of 15 series · 8 of 31 positions shown · non-contrast
Comparison: Previous exam(s).

CLINICAL DATA: Patient status post left breast lumpectomy.

EXAM:
2D DIGITAL DIAGNOSTIC BILATERAL MAMMOGRAM WITH CAD AND ADJUNCT TOMO

[L MLO (1 of 3)]
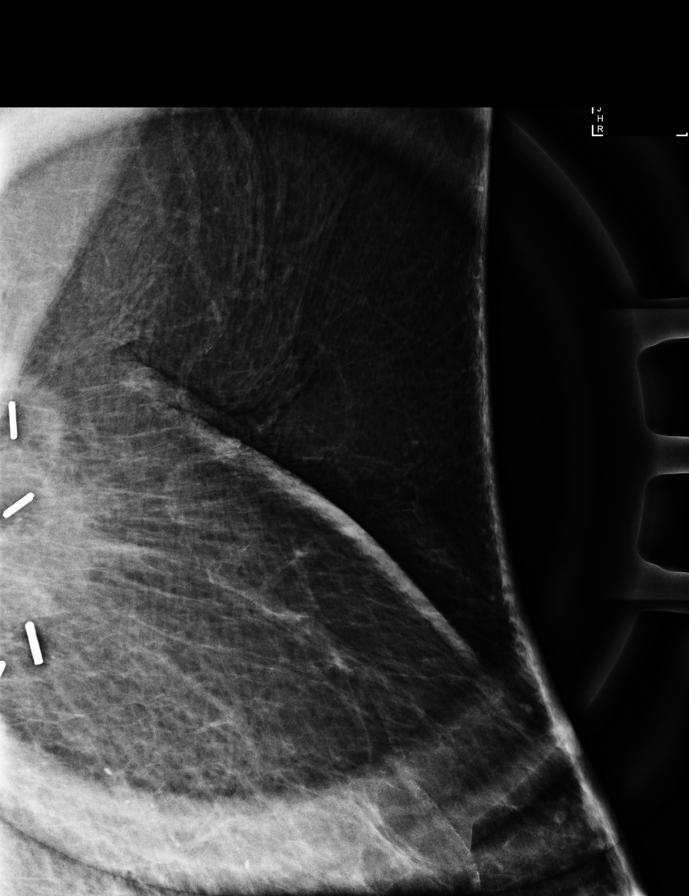

[L MLO (2 of 3)]
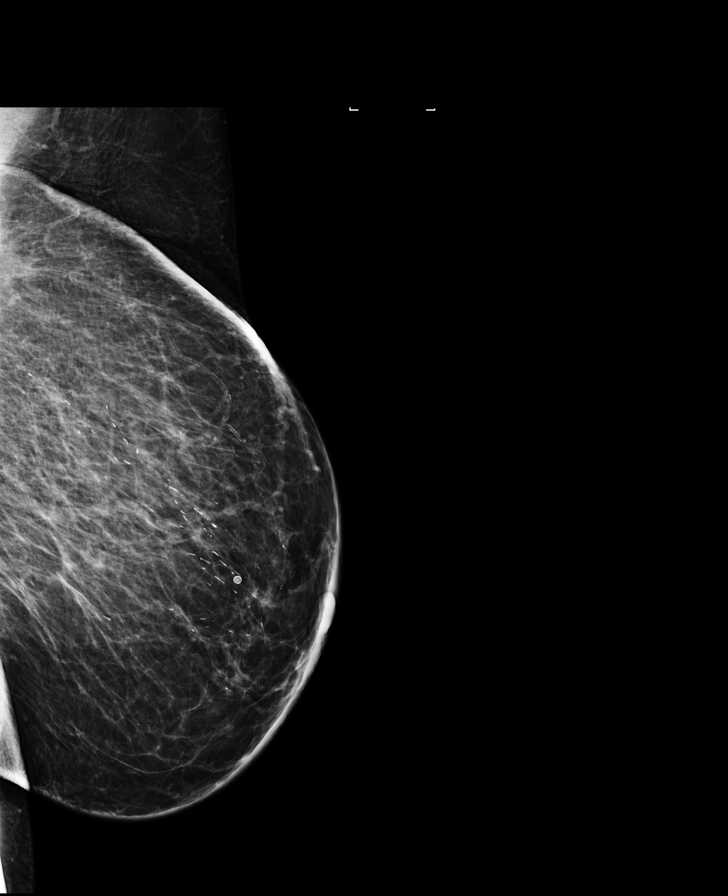

[L MLO (3 of 3)]
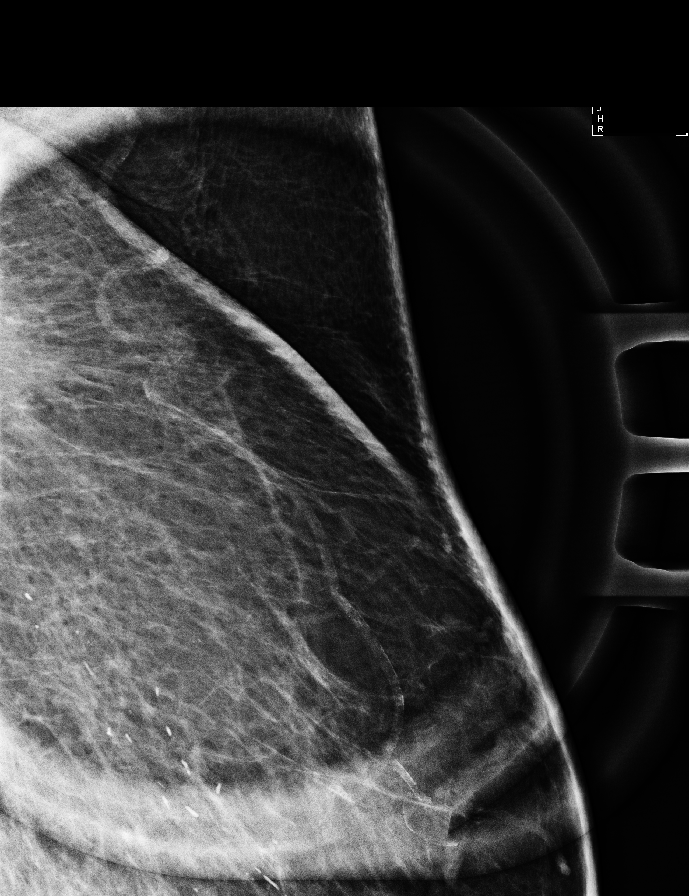

[L CC synth-2D]
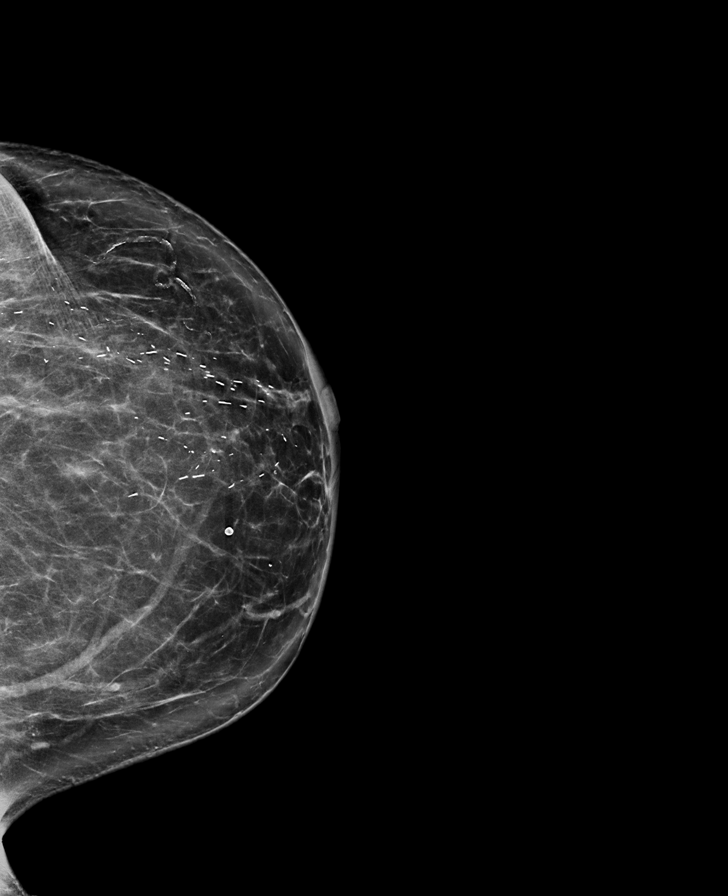

[L CC]
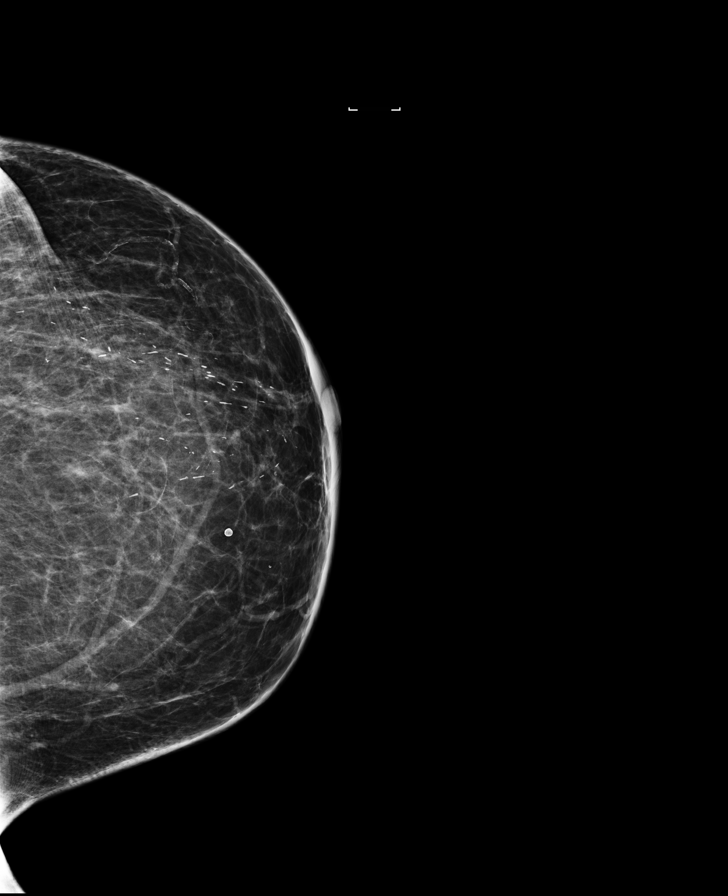

[R MLO synth-2D]
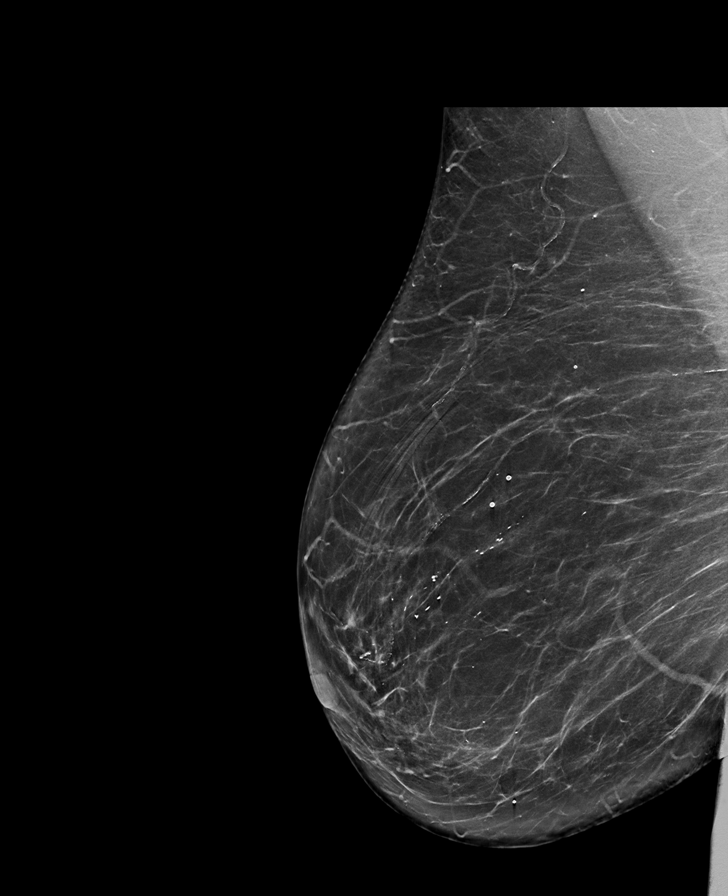

[R CC synth-2D]
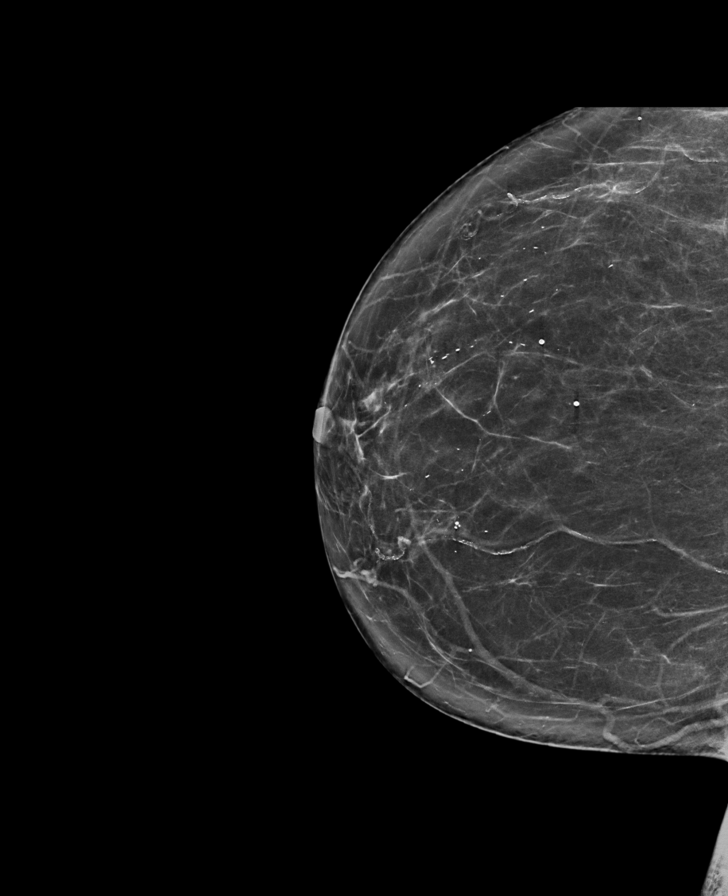

[R MLO]
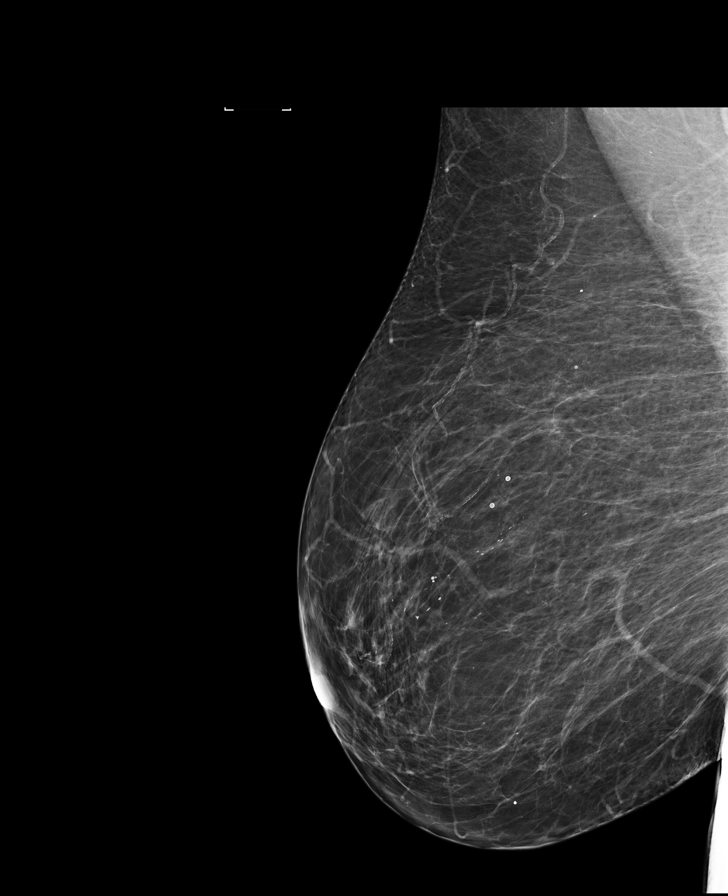

[8 of 31 positions shown; findings below may reference images not displayed]

ACR Breast Density Category b: There are scattered areas of
fibroglandular density.
FINDINGS: Interval lumpectomy changes left breast. No concerning masses,
calcifications or nonsurgical distortion identified within either
breast.

Mammographic images were processed with CAD.
IMPRESSION: No mammographic evidence for malignancy.

Interval lumpectomy changes left breast.

RECOMMENDATION:
Bilateral diagnostic mammography 1 year.

I have discussed the findings and recommendations with the patient.
Results were also provided in writing at the conclusion of the
visit. If applicable, a reminder letter will be sent to the patient
regarding the next appointment.

BI-RADS CATEGORY  2: Benign.

## 2018-01-02 ENCOUNTER — Other Ambulatory Visit: Payer: Self-pay | Admitting: Family Medicine

## 2018-01-02 DIAGNOSIS — I10 Essential (primary) hypertension: Secondary | ICD-10-CM

## 2018-01-17 ENCOUNTER — Encounter (HOSPITAL_COMMUNITY): Payer: Self-pay

## 2018-02-06 ENCOUNTER — Ambulatory Visit: Payer: Medicare Other | Admitting: Internal Medicine

## 2018-02-06 ENCOUNTER — Other Ambulatory Visit (HOSPITAL_COMMUNITY)
Admission: RE | Admit: 2018-02-06 | Discharge: 2018-02-06 | Disposition: A | Discharge status: Home / Self Care | Payer: Medicare Other | Source: Ambulatory Visit | Attending: Family Medicine | Admitting: Family Medicine

## 2018-02-06 VITALS — BP 121/60 | HR 57 | Temp 97.9°F | Ht 64.5 in | Wt 175.6 lb

## 2018-02-06 DIAGNOSIS — N898 Other specified noninflammatory disorders of vagina: Secondary | ICD-10-CM | POA: Insufficient documentation

## 2018-02-06 LAB — POCT WET PREP (WET MOUNT)
CLUE CELLS WET PREP WHIFF POC: NEGATIVE
TRICHOMONAS WET PREP HPF POC: ABSENT

## 2018-02-06 MED ORDER — ESTROGENS, CONJUGATED 0.625 MG/GM VA CREA: 0.5000 g | TOPICAL_CREAM | VAGINAL | 12 refills | Status: DC

## 2018-02-06 NOTE — Patient Instructions (Signed)
I am going to prescribe you a medication called Premarin.  You are going to take this on Mondays and Thursdays.  Use a quarter of a vaginal applicator to half vaginal applicator.  You can increase to a full vaginal applicator if need be.  Please follow-up in about 1 month to ensure things have improved

## 2018-02-06 NOTE — Progress Notes (Signed)
   [facility] Kerrin Mo, MD Phone: [phone]  Reason For Visit: SDA for Vaginal Irritation   #Presents with vaginal irritation.  She states that she has been a widow for several years.  She is recently gotten engaged and has been sexually active after many years of not being sexually active.  She states that she has a lot of soreness with sexual activity and pain.  She states that sometimes after sexual activity she will have a small laceration on her vulva.  She indicates that she has used lubricants for sexual activity however still has a lot of pain with this.  She has had a total hysterectomy done many years back.  She is in has not had any trouble since then until meeting her new partner.  She denies any vaginal discharge, vaginal irritation or itching.  She has been using Vaigisil to help with the irritation and it has helped somewhat.  Past Medical History Reviewed problem list.  Medications- reviewed and updated No additions to family history Social history- patient is a non smoker  Objective: BP 121/60 (BP Location: Right Arm, Patient Position: Sitting, Cuff Size: Normal)   Pulse (!) 57   Temp 97.9 F (36.6 C) (Oral)   Ht 5' 4.5" (1.638 m)   Wt 175 lb 9.6 oz (79.7 kg)   SpO2 97%   BMI 29.68 kg/m  Gen: NAD, alert, cooperative with exam GU: external vaginal tissue consistent with vaginal atrophy , no cervix, no abnormal vaginal discharge, no abdominal/ adnexal masses Neuro: Normal gait  Assessment/Plan: See problem based a/p  Vaginal irritation Vaginal atrophy noted on examination.  Rule out STDs -Recommend patient continue using lubricants with sexual activity -We will provide patient with Premarin -we will start at 0.5 mg indicate that patient can increase to 1 g -Follow-up in 1 month to see how patient is doing - POCT Wet Prep [facility]) - Cervicovaginal ancillary only - conjugated estrogens (PREMARIN) vaginal cream; Place 1.28  Applicatorfuls vaginally 2 (two) times a week. Please use twice weekly  Dispense: 42.5 g; Refill: 12 - HIV antibody - RPR

## 2018-02-07 DIAGNOSIS — N898 Other specified noninflammatory disorders of vagina: Secondary | ICD-10-CM | POA: Insufficient documentation

## 2018-02-07 LAB — RPR: RPR: NONREACTIVE

## 2018-02-07 LAB — HIV ANTIBODY (ROUTINE TESTING W REFLEX): HIV SCREEN 4TH GENERATION: NONREACTIVE

## 2018-02-07 NOTE — Assessment & Plan Note (Addendum)
Vaginal atrophy noted on examination.  Rule out STDs -Recommend patient continue using lubricants with sexual activity -We will provide patient with Premarin -we will start at 0.5 mg indicate that patient can increase to 1 g -Follow-up in 1 month to see how patient is doing - POCT Wet Prep [facility]) - Cervicovaginal ancillary only - conjugated estrogens (PREMARIN) vaginal cream; Place 4.43 Applicatorfuls vaginally 2 (two) times a week. Please use twice weekly  Dispense: 42.5 g; Refill: 12 - HIV antibody - RPR

## 2018-02-08 LAB — CERVICOVAGINAL ANCILLARY ONLY
CHLAMYDIA, DNA PROBE: NEGATIVE
NEISSERIA GONORRHEA: NEGATIVE

## 2018-02-24 ENCOUNTER — Telehealth: Payer: Self-pay | Admitting: Internal Medicine

## 2018-02-24 NOTE — Telephone Encounter (Signed)
I agree - she should continue to monitor bp and we will discuss at follow-up.  Dr. Lemmie Evens

## 2018-02-24 NOTE — Telephone Encounter (Signed)
Pt c/o Shortness Of Breath: STAT if SOB developed within the last 24 hours or pt is noticeably SOB on the phone  1. Are you currently SOB (can you hear that pt is SOB on the phone)? No   2. How long have you been experiencing SOB? Within last 24 hours, started yesterday // patient states that "it is not serious."  3. Are you SOB when sitting or when up moving around? N/a  4. Are you currently experiencing any other symptoms? High BP 195/90 //    *attempted to connect call with triage

## 2018-02-24 NOTE — Telephone Encounter (Signed)
Pt called as she was working in the kitchen moving dishes around and strained a muscle or something in her right side.  This happened a few days ago and since then she has noticed her BP has increased.  Last night she had a hard time sleeping and she did take some Tylenol. She also checked her BP it was 168/90 (1AM); 150/89 (this am); 147/80 (pm). Pt states she has been taking all medications as prescribed but she is worried her BP is elevated since she has a Thoracic aortic aneurysm.   Pt stated that she is not in pain unless she touches spot or moves the wrong way. Pt instructed to rest and avoid strenuous activity for for a few days. She is also scheduled for annual appointment with Dr. Debara Pickett on 4/8.Told pt I would forward to Dr. Debara Pickett to review in case there were changes to make before her appointment on the 8th. Pt verbalized understanding, no additional questions at this time.

## 2018-02-24 NOTE — Telephone Encounter (Signed)
F/U patient can be reached at [phone]

## 2018-03-10 ENCOUNTER — Encounter: Payer: Self-pay | Admitting: Internal Medicine

## 2018-03-10 ENCOUNTER — Ambulatory Visit: Payer: Medicare Other | Admitting: Internal Medicine

## 2018-03-10 VITALS — BP 156/95 | HR 60 | Ht 64.5 in | Wt 170.4 lb

## 2018-03-10 DIAGNOSIS — I4892 Unspecified atrial flutter: Secondary | ICD-10-CM

## 2018-03-10 DIAGNOSIS — I712 Thoracic aortic aneurysm, without rupture, unspecified: Secondary | ICD-10-CM

## 2018-03-10 DIAGNOSIS — I10 Essential (primary) hypertension: Secondary | ICD-10-CM

## 2018-03-10 MED ORDER — LOSARTAN POTASSIUM 25 MG PO TABS: 25.0000 mg | ORAL_TABLET | Freq: Every day | ORAL | 3 refills | Status: DC

## 2018-03-10 NOTE — Progress Notes (Signed)
OFFICE NOTE  Chief Complaint:  Routine follow-up  Primary Care Physician: Zenia Resides, MD  HPI:  Christina Hardin is a 67 y.o. female who is coming referred to me by Dr. Andria Frames for evaluation of presyncope, dyspnea and exertion and fatigue. Recently she was on an airline flight and had an episode where she felt hot and flushed and like she was going to pass out on the airplane. They laid her down and a physician on the flight treated her with oxygen and elevating her feet. Ultimately her symptoms improve. Since that time she's had a couple of other episodes of presyncope, and feeling that she was short of breath and fatigue. She reportedly had 2 episodes yesterday. She may report some racing of her heart during these episodes. She denies any chest pain. She does feel like sometimes she has to take a deep breath. She's also been more fatigued than usual. She says she has a history of very poor sleep and only gets 3-4 hours of sleep at night. In the past she went for sleep study but did not fall sleep long enough for a good reading. She apparently also has mild RA but is only on meloxicam. Because of her recent plane flight a CT scan was ordered to rule out pulmonary embolus. 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 Family history is not significant for coronary disease however her mother does have atrial fibrillation.  07/06/2016  [name] returns today for follow-up. We placed her on a monitor which fairly quickly demonstrated atrial flutter. This is paroxysmal and she is quite symptomatic. I think the episodes where she was presyncopal or likely related to this. Her echo actually shows normal systolic function but grade 2 diastolic dysfunction. This makes sense. Since she is dependent on atrial cake for LV filling, when she goes into atrial flutter this is likely causing a marked decrease in cardiac output which causes her to be  presyncopal. She was previously on a diuretic which probably exacerbated the problem and fortunate Dr. Andria Frames stop this medication. I have started her on metoprolol with some improvement in her symptoms. She still notes some episodes of palpitations however they're less frequent and less severe. She's had no further presyncope. Blood pressure is actually elevated today at 140/98. Dr. Andria Frames also checked blood work yesterday for anemia and it appears that she is slightly more anemic despite being started on iron. This may be an effect of adding Eliquis that has unmasked some slow GI bleeding source.  04/02/2017  [name] returns for follow-up. Unfortunately over the last year she was diagnosed with breast cancer, namely DCIS. She underwent lumpectomy and radiation treatments. She is cancer free at this time. Her hemoglobin is remained stable. She is on Eliquis. She denies any recurrent atrial flutter. She was referred to see Dr. Cyndia Bent in November for her ascending aortic aneurysm. He recommended repeat imaging this summer and she will follow-up with him. Blood pressure appears well controlled on Toprol-XL 25 mg daily.  03/10/2018  [name] returns today for follow-up.  Overall she is doing well.  She has been cancer free although could not tolerate Taxol.  She denies any chest pain or worsening shortness of breath.  She has had no recurrent atrial flutter.  She is tolerating Eliquis without bleeding problems.  Heart rate is stable today 57 and EKG shows a sinus bradycardia.  Blood pressure is somewhat elevated.  PMHx:  Past Medical History:  Diagnosis Date  . A-fib ([place])   . Anxiety   . Breast cancer ([place]) 09/05/2016   left breast dcis  . Carpal tunnel syndrome   . Ductal carcinoma in situ (DCIS) of left breast   . Family history of breast cancer   . Gastric bypass status for obesity   . GERD (gastroesophageal reflux disease)   . Hypertension   . Personal history of chemotherapy 2017  .  Rheumatoid arthritis ([place])   . Thoracic aortic aneurysm ([place])   . Thoracic aortic aneurysm [facility])     Past Surgical History:  Procedure Laterality Date  . ABDOMINAL HYSTERECTOMY    . APPENDECTOMY  1958  . BLADDER REPAIR    . BREAST BIOPSY Left   . BREAST LUMPECTOMY WITH RADIOACTIVE SEED AND SENTINEL LYMPH NODE BIOPSY Left 10/09/2016   Procedure: BREAST LUMPECTOMY WITH RADIOACTIVE SEED AND SENTINEL LYMPH NODE BIOPSY;  Surgeon: Donnie Mesa, MD;  Location: [place];  Service: General;  Laterality: Left;  . CARPAL TUNNEL RELEASE Left 08/22/2017   Procedure: LEFT CARPAL TUNNEL RELEASE;  Surgeon: Daryll Brod, MD;  Location: [place];  Service: Orthopedics;  Laterality: Left;  . CARPOMETACARPEL SUSPENSION PLASTY Left 08/22/2017   Procedure: SUSPENSIONPLASTY LEFT THUMB TRAPENZIUM EXCISION ABDUCTOR POLLICIS LONGUS;  Surgeon: Daryll Brod, MD;  Location: [place];  Service: Orthopedics;  Laterality: Left;  . COLONOSCOPY W/ POLYPECTOMY    . GASTRIC BYPASS    . HYSTERECTOMY ABDOMINAL WITH SALPINGECTOMY    . ULNAR NERVE TRANSPOSITION Left 08/22/2017   Procedure: ULNAR NERVE DECOMPRESSION;  Surgeon: Daryll Brod, MD;  Location: [place];  Service: Orthopedics;  Laterality: Left;    FAMHx:  Family History  Problem Relation Age of Onset  . Atrial fibrillation Mother   . Dementia Mother   . Hypertension Mother   . Gout Mother   . Arthritis Mother   . Stroke Mother   . COPD Father   . Emphysema Father   . Lung cancer Father   . Melanoma Father   . Hypertension Sister   . Sleep apnea Sister   . Healthy Brother   . Hypertension Brother   . Healthy Brother   . Hypertension Brother   . Heart attack Maternal Aunt   . Breast cancer Maternal Aunt        dx over 2  . Heart attack Maternal Uncle   . Prostate cancer Maternal Uncle        unsure if this was truly prostate cancer  . Heart attack Paternal Uncle   . Heart attack  Maternal Grandfather   . Breast cancer Cousin        mat first cousin  . Breast cancer Cousin        mat first cousin  . Cancer Cousin        father's maternal first cousin with Breast, Ovarian, Uterine Cancer  . Leukemia Other        father's maternal uncle  . Leukemia Other        father's maternal uncle  . Leukemia Cousin        3 of father's maternal first cousin    SOCHx:   reports that she has never smoked. She has never used smokeless tobacco. She reports that she does not drink alcohol or use drugs.  ALLERGIES:  Allergies  Allergen Reactions  . Dilaudid [Hydromorphone Hcl] Shortness Of Breath  . Propoxyphene Hcl Other (See Comments)  hyperventilation    ROS: Pertinent items noted in HPI and remainder of comprehensive ROS otherwise negative.  HOME MEDS: Current Outpatient Medications  Medication Sig Dispense Refill  . acetaminophen (TYLENOL) 500 MG tablet Take 1,000 mg by mouth every 6 (six) hours as needed for headache (pain).    . conjugated estrogens (PREMARIN) vaginal cream Place 3.70 Applicatorfuls vaginally 2 (two) times a week. Please use twice weekly 42.5 g 12  . Cyanocobalamin 2500 MCG CHEW Chew 2,500 mcg by mouth daily. Vitamin B12    . diphenhydramine-acetaminophen (TYLENOL PM) 25-500 MG TABS tablet Take 2 tablets by mouth at bedtime.    Marland Kitchen ELIQUIS 5 MG TABS tablet TAKE 1 TABLET BY MOUTH 2 TIMES DAILY. 180 tablet 1  . hydrochlorothiazide (HYDRODIURIL) 12.5 MG tablet TAKE 1 TABLET BY MOUTH DAILY. 90 tablet 3  . metoprolol succinate (TOPROL-XL) 25 MG 24 hr tablet TAKE 1 TABLET BY MOUTH DAILY. 90 tablet 3  . metroNIDAZOLE (METROGEL) 1 % gel Apply topically daily. (Patient taking differently: Apply 1 application topically daily as needed (rosacea). ) 45 g 0  . Multiple Vitamins-Minerals (ADULT ONE DAILY GUMMIES) CHEW Chew 2 tablets by mouth daily with lunch.    . orlistat (ALLI) 60 MG capsule Take 60 mg by mouth 3 (three) times daily with meals.    .  triamcinolone cream (KENALOG) 0.1 % Apply 1 application topically 2 (two) times daily. 30 g 0  . losartan (COZAAR) 25 MG tablet Take 1 tablet (25 mg total) by mouth daily. 90 tablet 3   No current facility-administered medications for this visit.     LABS/IMAGING: No results found for this or any previous visit (from the past 48 hour(s)). No results found.  WEIGHTS: Wt Readings from Last 3 Encounters:  03/10/18 170 lb 6.4 oz (77.3 kg)  02/06/18 175 lb 9.6 oz (79.7 kg)  10/03/17 185 lb 6.4 oz (84.1 kg)    VITALS: BP (!) 156/95 (BP Location: Left Arm, Cuff Size: Normal)   Pulse 60   Ht 5' 4.5" (1.638 m)   Wt 170 lb 6.4 oz (77.3 kg)   SpO2 100%   BMI 28.80 kg/m   EXAM: General appearance: alert and no distress Neck: no carotid bruit, no JVD and thyroid not enlarged, symmetric, no tenderness/mass/nodules Lungs: clear to auscultation bilaterally Heart: regular rate and rhythm, S1, S2 normal, no murmur, click, rub or gallop Abdomen: soft, non-tender; bowel sounds normal; no masses,  no organomegaly Extremities: extremities normal, atraumatic, no cyanosis or edema Pulses: 2+ and symmetric Skin: Skin color, texture, turgor normal. No rashes or lesions Neurologic: Grossly normal Psych: Plus  EKG: Sinus bradycardia 57-personally reviewed  ASSESSMENT: 1. Paroxysmal atrial flutter 2. Normal LVEF with grade 2 DD, normal LA size 3. Dyspnea on exertion - related to diastolic dysfunction 4. Hypertension 5. Thoracic aortic aneurysm (4.6 cm - 2018) 6. Paroxysmal atrial flutter - CHADSVASC score of 4 7. Recent DCIS s/p lumpectomy and radiation  PLAN: 1.   [name] denies any recurrent palpitations or atrial flutter.  Blood pressure was elevated somewhat today.  She does have a thoracic aortic aneurysm measuring 4.6 cm in June/2018.  This will need ongoing surveillance.  Radiology recommends semiannual imaging, and it has been 9 months since her prior study.  We will plan to repeat  CT angiogram of the aorta to further evaluate her aneurysm. She may benefit from additional treatment of blood pressure.  I recommend adding losartan 25 mg daily.    Plan follow-up of  blood pressure in 4-6 weeks -check be met in 1-2 weeks after starting losartan.  We will discuss results of her CT at that time.  Pixie Casino, MD, [facility], [place] Director of the Advanced Lipid Disorders &  Cardiovascular Risk Reduction Clinic Diplomate of the American Board of Clinical Lipidology Attending Cardiologist  Direct Dial: [phone]  Fax: [phone]  Website:  [email or web address] 03/10/2018, 4:26 PM

## 2018-03-10 NOTE — Patient Instructions (Signed)
Medication Instructions:   START losartan 25mg  once daily  Labwork:  BMET in 1-2 weeks to assess kidney function/electrolytes  Testing/Procedures:  NONE  Follow-Up:  Your physician recommends that you schedule a follow-up appointment in: 4-6 weeks with Dr. Debara Pickett.   If you need a refill on your cardiac medications before your next appointment, please call your pharmacy.  Any Other Special Instructions Will Be Listed Below (If Applicable).

## 2018-03-17 ENCOUNTER — Ambulatory Visit: Payer: Medicare Other | Admitting: Internal Medicine

## 2018-03-21 LAB — BASIC METABOLIC PANEL
BUN / CREAT RATIO: 15 (ref 12–28)
BUN: 11 mg/dL (ref 8–27)
CO2: 26 mmol/L (ref 20–29)
CREATININE: 0.75 mg/dL (ref 0.57–1.00)
Calcium: 10.1 mg/dL (ref 8.7–10.3)
Chloride: 105 mmol/L (ref 96–106)
GFR, EST AFRICAN AMERICAN: 96 mL/min/{1.73_m2} (ref >59)
GFR, EST NON AFRICAN AMERICAN: 83 mL/min/{1.73_m2} (ref >59)
Glucose: 83 mg/dL (ref 65–99)
POTASSIUM: 3.9 mmol/L (ref 3.5–5.2)
SODIUM: 143 mmol/L (ref 134–144)

## 2018-04-21 ENCOUNTER — Ambulatory Visit: Payer: Medicare Other | Admitting: Internal Medicine

## 2018-04-29 ENCOUNTER — Encounter: Payer: Self-pay | Admitting: Internal Medicine

## 2018-04-29 ENCOUNTER — Encounter (INDEPENDENT_AMBULATORY_CARE_PROVIDER_SITE_OTHER): Payer: Self-pay

## 2018-04-29 ENCOUNTER — Ambulatory Visit: Payer: Medicare Other | Admitting: Internal Medicine

## 2018-04-29 VITALS — BP 137/85 | HR 52 | Ht 64.5 in | Wt 175.4 lb

## 2018-04-29 DIAGNOSIS — I10 Essential (primary) hypertension: Secondary | ICD-10-CM | POA: Diagnosis not present

## 2018-04-29 DIAGNOSIS — I4892 Unspecified atrial flutter: Secondary | ICD-10-CM

## 2018-04-29 DIAGNOSIS — I712 Thoracic aortic aneurysm, without rupture, unspecified: Secondary | ICD-10-CM

## 2018-04-29 NOTE — Patient Instructions (Signed)
Your physician recommends that you schedule a follow-up appointment as needed.   Dr. Debara Pickett recommends a blood pressure cuff - [employer]

## 2018-04-29 NOTE — Progress Notes (Signed)
OFFICE NOTE  Chief Complaint:  Routine follow-up  Primary Care Physician: Zenia Resides, MD  HPI:  Christina Hardin is a 67 y.o. female who is coming referred to me by Dr. Andria Frames for evaluation of presyncope, dyspnea and exertion and fatigue. Recently she was on an airline flight and had an episode where she felt hot and flushed and like she was going to pass out on the airplane. They laid her down and a physician on the flight treated her with oxygen and elevating her feet. Ultimately her symptoms improve. Since that time she's had a couple of other episodes of presyncope, and feeling that she was short of breath and fatigue. She reportedly had 2 episodes yesterday. She may report some racing of her heart during these episodes. She denies any chest pain. She does feel like sometimes she has to take a deep breath. She's also been more fatigued than usual. She says she has a history of very poor sleep and only gets 3-4 hours of sleep at night. In the past she went for sleep study but did not fall sleep long enough for a good reading. She apparently also has mild RA but is only on meloxicam. Because of her recent plane flight a CT scan was ordered to rule out pulmonary embolus. 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 Family history is not significant for coronary disease however her mother does have atrial fibrillation.  07/06/2016  [name] returns today for follow-up. We placed her on a monitor which fairly quickly demonstrated atrial flutter. This is paroxysmal and she is quite symptomatic. I think the episodes where she was presyncopal or likely related to this. Her echo actually shows normal systolic function but grade 2 diastolic dysfunction. This makes sense. Since she is dependent on atrial cake for LV filling, when she goes into atrial flutter this is likely causing a marked decrease in cardiac output which causes her to be  presyncopal. She was previously on a diuretic which probably exacerbated the problem and fortunate Dr. Andria Frames stop this medication. I have started her on metoprolol with some improvement in her symptoms. She still notes some episodes of palpitations however they're less frequent and less severe. She's had no further presyncope. Blood pressure is actually elevated today at 140/98. Dr. Andria Frames also checked blood work yesterday for anemia and it appears that she is slightly more anemic despite being started on iron. This may be an effect of adding Eliquis that has unmasked some slow GI bleeding source.  04/02/2017  [name] returns for follow-up. Unfortunately over the last year she was diagnosed with breast cancer, namely DCIS. She underwent lumpectomy and radiation treatments. She is cancer free at this time. Her hemoglobin is remained stable. She is on Eliquis. She denies any recurrent atrial flutter. She was referred to see Dr. Cyndia Bent in November for her ascending aortic aneurysm. He recommended repeat imaging this summer and she will follow-up with him. Blood pressure appears well controlled on Toprol-XL 25 mg daily.  03/10/2018  [name] returns today for follow-up.  Overall she is doing well.  She has been cancer free although could not tolerate Taxol.  She denies any chest pain or worsening shortness of breath.  She has had no recurrent atrial flutter.  She is tolerating Eliquis without bleeding problems.  Heart rate is stable today 57 and EKG shows a sinus bradycardia.  Blood pressure is somewhat elevated.  04/29/2018  [name] returns for annual follow-up.  Overall she is doing well.  She says she does get a fatigue a little bit more easily.  Blood pressure seems well controlled here although at home her blood pressure has been in the 829-937 systolic range.  She does use a wrist cuff and I question whether that is accurate or not.  She said she would be interested in buying arm cuff and I  suggested that she try to correlate the 2 blood pressures on the same arm (not simultaneously).  She does have follow-up with Dr. Mohammed Kindle coming in June.  She should have a repeat CT angiogram.  This will be to assess the stability of her 4.6 cm ascending aortic aneurysm.  She denies any palpitations or recurrent A. fib and remains on Eliquis without any significant bleeding problems.  She also mentioned today that she is now residing in [facility] and will be looking to transfer her cardiac care to that area.  PMHx:  Past Medical History:  Diagnosis Date  . A-fib ([place])   . Anxiety   . Breast cancer ([place]) 09/05/2016   left breast dcis  . Carpal tunnel syndrome   . Ductal carcinoma in situ (DCIS) of left breast   . Family history of breast cancer   . Gastric bypass status for obesity   . GERD (gastroesophageal reflux disease)   . Hypertension   . Personal history of chemotherapy 2017  . Rheumatoid arthritis ([place])   . Thoracic aortic aneurysm ([place])   . Thoracic aortic aneurysm [facility])     Past Surgical History:  Procedure Laterality Date  . ABDOMINAL HYSTERECTOMY    . APPENDECTOMY  1958  . BLADDER REPAIR    . BREAST BIOPSY Left   . BREAST LUMPECTOMY WITH RADIOACTIVE SEED AND SENTINEL LYMPH NODE BIOPSY Left 10/09/2016   Procedure: BREAST LUMPECTOMY WITH RADIOACTIVE SEED AND SENTINEL LYMPH NODE BIOPSY;  Surgeon: Donnie Mesa, MD;  Location: [place];  Service: General;  Laterality: Left;  . CARPAL TUNNEL RELEASE Left 08/22/2017   Procedure: LEFT CARPAL TUNNEL RELEASE;  Surgeon: Daryll Brod, MD;  Location: [place];  Service: Orthopedics;  Laterality: Left;  . CARPOMETACARPEL SUSPENSION PLASTY Left 08/22/2017   Procedure: SUSPENSIONPLASTY LEFT THUMB TRAPENZIUM EXCISION ABDUCTOR POLLICIS LONGUS;  Surgeon: Daryll Brod, MD;  Location: [place];  Service: Orthopedics;  Laterality: Left;  . COLONOSCOPY W/ POLYPECTOMY    . GASTRIC  BYPASS    . HYSTERECTOMY ABDOMINAL WITH SALPINGECTOMY    . ULNAR NERVE TRANSPOSITION Left 08/22/2017   Procedure: ULNAR NERVE DECOMPRESSION;  Surgeon: Daryll Brod, MD;  Location: [place];  Service: Orthopedics;  Laterality: Left;    FAMHx:  Family History  Problem Relation Age of Onset  . Atrial fibrillation Mother   . Dementia Mother   . Hypertension Mother   . Gout Mother   . Arthritis Mother   . Stroke Mother   . COPD Father   . Emphysema Father   . Lung cancer Father   . Melanoma Father   . Hypertension Sister   . Sleep apnea Sister   . Healthy Brother   . Hypertension Brother   . Healthy Brother   . Hypertension Brother   . Heart attack Maternal Aunt   . Breast cancer Maternal Aunt        dx over 72  . Heart attack Maternal Uncle   . Prostate cancer Maternal Uncle  unsure if this was truly prostate cancer  . Heart attack Paternal Uncle   . Heart attack Maternal Grandfather   . Breast cancer Cousin        mat first cousin  . Breast cancer Cousin        mat first cousin  . Cancer Cousin        father's maternal first cousin with Breast, Ovarian, Uterine Cancer  . Leukemia Other        father's maternal uncle  . Leukemia Other        father's maternal uncle  . Leukemia Cousin        3 of father's maternal first cousin    SOCHx:   reports that she has never smoked. She has never used smokeless tobacco. She reports that she does not drink alcohol or use drugs.  ALLERGIES:  Allergies  Allergen Reactions  . Dilaudid [Hydromorphone Hcl] Shortness Of Breath  . Propoxyphene Hcl Other (See Comments)    hyperventilation    ROS: Pertinent items noted in HPI and remainder of comprehensive ROS otherwise negative.  HOME MEDS: Current Outpatient Medications  Medication Sig Dispense Refill  . acetaminophen (TYLENOL) 500 MG tablet Take 1,000 mg by mouth every 6 (six) hours as needed for headache (pain).    . conjugated estrogens (PREMARIN)  vaginal cream Place 0.73 Applicatorfuls vaginally 2 (two) times a week. Please use twice weekly 42.5 g 12  . Cyanocobalamin 2500 MCG CHEW Chew 2,500 mcg by mouth daily. Vitamin B12    . diphenhydramine-acetaminophen (TYLENOL PM) 25-500 MG TABS tablet Take 2 tablets by mouth at bedtime.    Marland Kitchen ELIQUIS 5 MG TABS tablet TAKE 1 TABLET BY MOUTH 2 TIMES DAILY. 180 tablet 1  . hydrochlorothiazide (HYDRODIURIL) 12.5 MG tablet TAKE 1 TABLET BY MOUTH DAILY. 90 tablet 3  . losartan (COZAAR) 25 MG tablet Take 1 tablet (25 mg total) by mouth daily. 90 tablet 3  . metoprolol succinate (TOPROL-XL) 25 MG 24 hr tablet TAKE 1 TABLET BY MOUTH DAILY. 90 tablet 3  . metroNIDAZOLE (METROGEL) 1 % gel Apply topically daily. (Patient taking differently: Apply 1 application topically daily as needed (rosacea). ) 45 g 0  . Multiple Vitamins-Minerals (ADULT ONE DAILY GUMMIES) CHEW Chew 2 tablets by mouth daily with lunch.    . triamcinolone cream (KENALOG) 0.1 % Apply 1 application topically 2 (two) times daily. 30 g 0   No current facility-administered medications for this visit.     LABS/IMAGING: No results found for this or any previous visit (from the past 48 hour(s)). No results found.  WEIGHTS: Wt Readings from Last 3 Encounters:  04/29/18 175 lb 6.4 oz (79.6 kg)  03/10/18 170 lb 6.4 oz (77.3 kg)  02/06/18 175 lb 9.6 oz (79.7 kg)    VITALS: BP 137/85   Pulse (!) 52   Ht 5' 4.5" (1.638 m)   Wt 175 lb 6.4 oz (79.6 kg)   SpO2 98%   BMI 29.64 kg/m   EXAM: General appearance: alert and no distress Neck: no carotid bruit, no JVD and thyroid not enlarged, symmetric, no tenderness/mass/nodules Lungs: clear to auscultation bilaterally Heart: regular rate and rhythm, S1, S2 normal, no murmur, click, rub or gallop Abdomen: soft, non-tender; bowel sounds normal; no masses,  no organomegaly Extremities: extremities normal, atraumatic, no cyanosis or edema Pulses: 2+ and symmetric Skin: Skin color, texture,  turgor normal. No rashes or lesions Neurologic: Grossly normal Psych: Plus  EKG: Deferred  ASSESSMENT: 1. Paroxysmal atrial  flutter - CHADSVASC score of 4 2. Normal LVEF with grade 2 DD, normal LA size (06/2016) 3. Low risk myoview stress test (09/2016) 4. Dyspnea on exertion - related to diastolic dysfunction 5. Hypertension 6. Thoracic aortic aneurysm (4.6 cm - 2018) 7. Recent DCIS s/p lumpectomy and radiation  PLAN: 1.   [name] continues to do well and is generally asymptomatic.  Blood pressure however seems to be a little higher at home.  I wonder if this could be an accurate cuff readings.  I encouraged her to get an arm cuff and try to compare the values.  Her blood pressure in the office today was reasonably well controlled.  She has follow-up in about a month with Dr. Cyndia Bent to follow-up on her ascending aortic aneurysm.  Ultimately she will transition her cardiac care to [facility].  I am recommending the former [facility] cardiologist at the [facility] with [place].  CT surgical services are also available at that location.  Follow-up with me as needed.  Pixie Casino, MD, [facility], [place] Director of the Advanced Lipid Disorders &  Cardiovascular Risk Reduction Clinic Diplomate of the American Board of Clinical Lipidology Attending Cardiologist  Direct Dial: [phone]  Fax: [phone]  Website:  [email or web address] 04/29/2018, 10:13 AM

## 2018-05-01 ENCOUNTER — Other Ambulatory Visit: Payer: Self-pay | Admitting: Surgery

## 2018-05-01 DIAGNOSIS — I712 Thoracic aortic aneurysm, without rupture, unspecified: Secondary | ICD-10-CM

## 2018-05-09 ENCOUNTER — Telehealth: Payer: Self-pay | Admitting: Internal Medicine

## 2018-05-09 DIAGNOSIS — I10 Essential (primary) hypertension: Secondary | ICD-10-CM

## 2018-05-09 MED ORDER — LOSARTAN POTASSIUM 50 MG PO TABS: 50.0000 mg | ORAL_TABLET | Freq: Every day | ORAL | 3 refills | Status: DC

## 2018-05-09 NOTE — Telephone Encounter (Signed)
Would increase losartan from 25 to 50 mg daily.  Dr. Lemmie Evens

## 2018-05-09 NOTE — Telephone Encounter (Addendum)
Pt c/o BP issue: running high   1. What are your last 5 BP readings? 157/107, 194/836, 151/99, 140/85, 140/88 2. Are you having any other symptoms (ex. Dizziness, headache, blurred vision, passed out)? headache 3. What is your medication issue? none   [phone]

## 2018-05-09 NOTE — Telephone Encounter (Signed)
Spoke with pt and advised of Dr. Lysbeth Penner recommendation to increase losartan from 25 mg to 50 mg. Pt verbalized understanding. Orders reflected in chart.

## 2018-05-09 NOTE — Telephone Encounter (Signed)
Spoke with pt who states her BP is staying relatively high and was advised to contact office if systolic stay greater than 150.  Last reading over that last 3 days. Pt unsure of exact dates:  6/7-157/107 took morning medication 147/98 151/99 194/83 140/85 140/88  Routing to Dr. Debara Pickett for recommendation.

## 2018-05-14 ENCOUNTER — Ambulatory Visit: Payer: Medicare Other | Admitting: Internal Medicine

## 2018-05-27 ENCOUNTER — Ambulatory Visit
Admission: RE | Admit: 2018-05-27 | Discharge: 2018-05-27 | Disposition: A | Discharge status: Home / Self Care | Payer: Medicare Other | Source: Ambulatory Visit | Attending: Surgery | Admitting: Surgery

## 2018-05-27 DIAGNOSIS — I712 Thoracic aortic aneurysm, without rupture, unspecified: Secondary | ICD-10-CM

## 2018-05-27 MED ORDER — IOPAMIDOL (ISOVUE-370) INJECTION 76%
75.0000 mL | Freq: Once | INTRAVENOUS | Status: AC | PRN
  Administered 2018-05-27: 75 mL via INTRAVENOUS

## 2018-05-28 ENCOUNTER — Ambulatory Visit: Payer: Medicare Other | Admitting: Surgery

## 2018-05-28 ENCOUNTER — Other Ambulatory Visit: Payer: Self-pay

## 2018-05-28 ENCOUNTER — Encounter: Payer: Self-pay | Admitting: Surgery

## 2018-05-28 VITALS — BP 130/80 | HR 59 | Resp 16 | Ht 64.5 in | Wt 172.0 lb

## 2018-05-28 DIAGNOSIS — Z8679 Personal history of other diseases of the circulatory system: Secondary | ICD-10-CM

## 2018-05-28 DIAGNOSIS — I712 Thoracic aortic aneurysm, without rupture, unspecified: Secondary | ICD-10-CM

## 2018-05-28 DIAGNOSIS — Z9889 Other specified postprocedural states: Secondary | ICD-10-CM | POA: Diagnosis not present

## 2018-05-28 NOTE — Progress Notes (Signed)
HPI:  The patient returns today for follow up of a 4.5 cm fusiform aortic aneurysm noted incidentally on a CTA of the chest done to rule out PE.  Her last CT of the chest on 05/29/2017 showed the maximum diameter to be 4.3 x 4.6 cm.  She continues to feel well without chest pain or shortness of breath.  She recently saw Dr. Debara Pickett and her losartan dose was increased for hypertension.  She said that she checks her blood pressure several times per week and is usually 130/80 or less.    Current Outpatient Medications  Medication Sig Dispense Refill  . acetaminophen (TYLENOL) 500 MG tablet Take 1,000 mg by mouth every 6 (six) hours as needed for headache (pain).    . conjugated estrogens (PREMARIN) vaginal cream Place 1.49 Applicatorfuls vaginally 2 (two) times a week. Please use twice weekly 42.5 g 12  . Cyanocobalamin 2500 MCG CHEW Chew 2,500 mcg by mouth daily. Vitamin B12    . diphenhydramine-acetaminophen (TYLENOL PM) 25-500 MG TABS tablet Take 2 tablets by mouth at bedtime.    Marland Kitchen ELIQUIS 5 MG TABS tablet TAKE 1 TABLET BY MOUTH 2 TIMES DAILY. 180 tablet 1  . hydrochlorothiazide (HYDRODIURIL) 12.5 MG tablet TAKE 1 TABLET BY MOUTH DAILY. 90 tablet 3  . losartan (COZAAR) 50 MG tablet Take 1 tablet (50 mg total) by mouth daily. 90 tablet 3  . metoprolol succinate (TOPROL-XL) 25 MG 24 hr tablet TAKE 1 TABLET BY MOUTH DAILY. 90 tablet 3  . metroNIDAZOLE (METROGEL) 1 % gel Apply topically daily. (Patient taking differently: Apply 1 application topically daily as needed (rosacea). ) 45 g 0  . Multiple Vitamins-Minerals (ADULT ONE DAILY GUMMIES) CHEW Chew 2 tablets by mouth daily with lunch.    . triamcinolone cream (KENALOG) 0.1 % Apply 1 application topically 2 (two) times daily. 30 g 0   No current facility-administered medications for this visit.      Physical Exam: BP 130/80 (BP Location: Right Arm, Patient Position: Sitting, Cuff Size: Large)   Pulse (!) 59   Resp 16   Ht 5' 4.5"  (1.638 m)   Wt 172 lb (78 kg)   SpO2 96% Comment: ON RA  BMI 29.07 kg/m  She looks well. Cardiac exam shows a regular rate and rhythm with normal heart sounds.  There is no murmur. Lungs are clear.  Diagnostic Tests:  CLINICAL DATA:  Thoracic aortic aneurysm, currently asymptomatic. History of left breast carcinoma.  EXAM: CT ANGIOGRAPHY CHEST WITH CONTRAST  TECHNIQUE: Multidetector CT imaging of the chest was performed using the standard protocol during bolus administration of intravenous contrast. Multiplanar CT image reconstructions and MIPs were obtained to evaluate the vascular anatomy.  CONTRAST:  2mL ISOVUE-370 IOPAMIDOL (ISOVUE-370) INJECTION 76%  Creatinine was obtained on site at [facility] at [address]  Results: Creatinine 0.8 mg/dL.  GFR 76  COMPARISON:  05/29/2017  FINDINGS: Cardiovascular: Normal heart size. No pericardial effusion. Satisfactory opacification of pulmonary arteries noted, and there is no evidence of pulmonary emboli. There is good contrast opacification of the thoracic aorta with no evidence of dissection or stenosis. Transverse diameters as follows:  3.9 cm sinuses of Valsalva  3.1 cm sino-tubular junction  4.4 cm mid ascending (stable by my measurement)  3.8 cm distal ascending/proximal arch  2.8 cm distal arch/proximal descending  2.4 cm distal descending  Bovine variant brachiocephalic arterial origin anatomy without proximal stenosis. No significant atheromatous plaque. Visualized proximal abdominal aorta unremarkable.  Mediastinum/Nodes: No hilar or mediastinal adenopathy. Stable right 1.7 cm paraesophageal nodule at the thoracic inlet. Postop change in the left axilla.  Lungs/Pleura: No pleural effusion. No pneumothorax. Radiation changes peripherally in the left upper lobe. Lungs otherwise clear.  Upper Abdomen: No acute findings.  Musculoskeletal: No chest wall abnormality. No  acute or significant osseous findings.  Review of the MIP images confirms the above findings.  IMPRESSION: 1. Stable 4.4 cm ascending thoracic aortic aneurysm without complicating features. Recommend annual imaging followup by CTA or MRA. This recommendation follows 2010 ACCF/AHA/AATS/ACR/ASA/SCA/SCAI/SIR/STS/SVM Guidelines for the Diagnosis and Management of Patients with Thoracic Aortic Disease. Circulation. 2010; 121: E321-Y248 2. 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   Electronically Signed   By: Lucrezia Europe M.D.   On: 05/28/2018 08:54   Impression:  The ascending aortic aneurysm measures 4.4 cm on the recent CT scan of the chest.  This is essentially unchanged from the CT scan one year ago.  I reviewed the CT images with her and her fianc and answered their questions.  Her blood pressure is under fairly good control and I stressed the importance of this to minimize the risk of progressive enlargement and aortic dissection.  Plan:  I will see her back in 1 year with a CTA of the chest to follow-up on her ascending aortic aneurysm   I spent 15 minutes performing this established patient evaluation and > 50% of this time was spent face to face counseling and coordinating the care of this patient's aortic aneurysm.    Gaye Pollack, MD [facility] [phone]

## 2018-06-03 ENCOUNTER — Telehealth: Payer: Self-pay | Admitting: Internal Medicine

## 2018-06-03 NOTE — Telephone Encounter (Signed)
Pt calling requesting a refill on Eliquis[facility] [place] [place]. Pt would like a call back at [phone]. Please address

## 2018-06-04 MED ORDER — APIXABAN 5 MG PO TABS: 5.0000 mg | ORAL_TABLET | Freq: Two times a day (BID) | ORAL | 1 refills | Status: DC

## 2018-06-27 ENCOUNTER — Telehealth: Payer: Self-pay

## 2018-06-27 MED ORDER — LORAZEPAM 0.5 MG PO TABS: 0.5000 mg | ORAL_TABLET | Freq: Two times a day (BID) | ORAL | 1 refills | Status: DC | PRN

## 2018-06-27 NOTE — Telephone Encounter (Signed)
Spoke with patient.  Stress is real and request is reasonable.  Rx sent.

## 2018-06-27 NOTE — Addendum Note (Signed)
Addended by: Zenia Resides on: 06/27/2018 09:43 AM   Modules accepted: Orders

## 2018-06-27 NOTE — Telephone Encounter (Signed)
Pt called nurse line requesting to speak with Dr. Andria Frames. Pt states she is going through a lot right now, a recent move to [place], and two deaths in the family back to back. Pt is requesting an anxiety medication [facility] [place].

## 2018-07-02 ENCOUNTER — Other Ambulatory Visit: Payer: Self-pay | Admitting: Internal Medicine

## 2018-07-02 DIAGNOSIS — I10 Essential (primary) hypertension: Secondary | ICD-10-CM

## 2018-07-02 MED ORDER — METOPROLOL SUCCINATE ER 25 MG PO TB24: 25.0000 mg | ORAL_TABLET | Freq: Every day | ORAL | 0 refills | Status: DC

## 2018-07-02 MED ORDER — LOSARTAN POTASSIUM 50 MG PO TABS
50.0000 mg | ORAL_TABLET | Freq: Every day | ORAL | 0 refills | Status: DC
Start: 2018-07-02 — End: 2018-08-16

## 2018-07-02 NOTE — Telephone Encounter (Signed)
Rx request sent to pharmacy.  

## 2018-07-02 NOTE — Telephone Encounter (Signed)
New message    *STAT* If patient is at the pharmacy, call can be transferred to refill team.   1. Which medications need to be refilled? (please list name of each medication and dose if known) metoprolol succinate (TOPROL-XL) 25 MG 24 hr tablet losartan (COZAAR) 50 MG tablet  2. Which pharmacy/location (including street and city if local pharmacy) is medication to be sent to?[id number] [place] [place] [address]  3. Do they need a 30 day or 90 day supply? [place]

## 2018-08-16 ENCOUNTER — Other Ambulatory Visit: Payer: Self-pay | Admitting: Internal Medicine

## 2018-08-16 DIAGNOSIS — I10 Essential (primary) hypertension: Secondary | ICD-10-CM

## 2018-08-20 ENCOUNTER — Telehealth: Payer: Self-pay | Admitting: Internal Medicine

## 2018-08-20 DIAGNOSIS — I10 Essential (primary) hypertension: Secondary | ICD-10-CM

## 2018-08-20 MED ORDER — LOSARTAN POTASSIUM 50 MG PO TABS: 50.0000 mg | ORAL_TABLET | Freq: Every day | ORAL | 1 refills | Status: DC

## 2018-08-20 NOTE — Telephone Encounter (Signed)
New Message     *STAT* If patient is at the pharmacy, call can be transferred to refill team.   1. Which medications need to be refilled? (please list name of each medication and dose if known) losartan (COZAAR) 50 MG tablet TAKE 1 tablet by mouth twice a day    2. Which pharmacy/location (including street and city if local pharmacy) is medication to be sent to? [id number] [place] [place]  3. Do they need a 30 day or 90 day supply? 90  Patient is needing the script to indicate that she is to take 1 tablet by mouth twice a day due to Dr. Debara Pickett increasing the dose at the last office visit.

## 2018-08-20 NOTE — Addendum Note (Signed)
Addended by: Venetia Maxon on: 08/20/2018 03:32 PM   Modules accepted: Orders

## 2018-08-29 ENCOUNTER — Other Ambulatory Visit: Payer: Self-pay | Admitting: Family Medicine

## 2018-08-29 DIAGNOSIS — Z9889 Other specified postprocedural states: Secondary | ICD-10-CM

## 2018-09-24 ENCOUNTER — Other Ambulatory Visit: Payer: Self-pay | Admitting: Internal Medicine

## 2018-10-02 ENCOUNTER — Ambulatory Visit
Admission: RE | Admit: 2018-10-02 | Discharge: 2018-10-02 | Disposition: A | Discharge status: Home / Self Care | Payer: Medicare Other | Source: Ambulatory Visit | Attending: Family Medicine | Admitting: Family Medicine

## 2018-10-02 DIAGNOSIS — Z9889 Other specified postprocedural states: Secondary | ICD-10-CM

## 2018-10-06 ENCOUNTER — Inpatient Hospital Stay: Payer: Medicare Other | Attending: Hematology and Oncology | Admitting: Hematology and Oncology

## 2018-10-06 NOTE — Assessment & Plan Note (Deleted)
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 Tolerating extremely well without any hot flashes.  Chronic osteoarthritis  Breast cancer surveillance: 1.  Breast exam 10/06/2018: Benign 2. mammogram 10/02/2018: Benign breast density category B  Return to clinic in 1 year for follow-up

## 2018-10-27 ENCOUNTER — Other Ambulatory Visit: Payer: Self-pay

## 2018-10-27 ENCOUNTER — Encounter: Payer: Self-pay | Admitting: Family Medicine

## 2018-10-27 ENCOUNTER — Ambulatory Visit: Payer: Medicare Other | Admitting: Family Medicine

## 2018-10-27 DIAGNOSIS — M5416 Radiculopathy, lumbar region: Secondary | ICD-10-CM | POA: Diagnosis not present

## 2018-10-27 MED ORDER — GABAPENTIN 100 MG PO CAPS: 100.0000 mg | ORAL_CAPSULE | Freq: Three times a day (TID) | ORAL | 12 refills | Status: DC

## 2018-10-27 NOTE — Patient Instructions (Addendum)
Play around with the dose of gabapentin.  Call me when you figure out the right dose for you and I will prescribe a 90 day supply with the correct instructions. Your previous polyp was not precancerous.  So, you are still on schedule for colonoscopy every 10 years.  Next due May 2023

## 2018-10-28 ENCOUNTER — Encounter: Payer: Self-pay | Admitting: Family Medicine

## 2018-10-28 NOTE — Assessment & Plan Note (Signed)
Gabapentin for neuropathic pain.  Patient strongly wants to avoid surgery.  No red flag symptoms or history.  Conservative RX

## 2018-10-28 NOTE — Progress Notes (Signed)
Established Patient Office Visit  Subjective:  Patient ID: Christina Hardin, female    DOB: 25-Jan-1951  Age: 67 y.o. MRN: [id number]  CC:  Chief Complaint  Patient presents with  . Back Pain   REcently remarried.  Former name was Christina Hardin's.  She is happy. HPI Christina Hardin presents for low back, right hip and right leg pain.  Patient has known, longstanding low back pain.  At one point had some left leg radiculopathy.  Longstanding numbness of right thigh.  Over past few weeks has had severe shooting pain from low back to right hip and right thigh.  Chronic numbness continues.  No saddle anesthesia, rectal incont or urinary incont.  Not immunocompromised.  Not trauma.  Does have hx of breast cancer but it was only carcinoma in situ and has been adaquately treated. Reviewed 2006 lumbar MRI.  Second issue is when she is due for next colonoscopy.  Originally told in 5 years due to polyp.  Called GI for FU and they did not want to see her.  Patient was under the impression it was because of chronic anticoag therapy.  I reviewed the previous biopsy, which was a lymphoid polyp, not a precancerous lesion.  As such, she should be on schedule for q10y colonoscopy.  Past Medical History:  Diagnosis Date  . A-fib ([place])   . Anxiety   . Breast cancer ([place]) 09/05/2016   left breast dcis  . Carpal tunnel syndrome   . Ductal carcinoma in situ (DCIS) of left breast   . Family history of breast cancer   . Gastric bypass status for obesity   . GERD (gastroesophageal reflux disease)   . Hypertension   . Personal history of chemotherapy 2017  . Rheumatoid arthritis ([place])   . Thoracic aortic aneurysm ([place])   . Thoracic aortic aneurysm [facility])     Past Surgical History:  Procedure Laterality Date  . ABDOMINAL HYSTERECTOMY    . APPENDECTOMY  1958  . BLADDER REPAIR    . BREAST BIOPSY Left   . BREAST LUMPECTOMY WITH RADIOACTIVE SEED AND SENTINEL LYMPH NODE BIOPSY Left 10/09/2016   Procedure: BREAST  LUMPECTOMY WITH RADIOACTIVE SEED AND SENTINEL LYMPH NODE BIOPSY;  Surgeon: Donnie Mesa, MD;  Location: [facility];  Service: General;  Laterality: Left;  . CARPAL TUNNEL RELEASE Left 08/22/2017   Procedure: LEFT CARPAL TUNNEL RELEASE;  Surgeon: Daryll Brod, MD;  Location: [facility];  Service: Orthopedics;  Laterality: Left;  . CARPOMETACARPEL SUSPENSION PLASTY Left 08/22/2017   Procedure: SUSPENSIONPLASTY LEFT THUMB TRAPENZIUM EXCISION ABDUCTOR POLLICIS LONGUS;  Surgeon: Daryll Brod, MD;  Location: [facility];  Service: Orthopedics;  Laterality: Left;  . COLONOSCOPY W/ POLYPECTOMY    . GASTRIC BYPASS    . HYSTERECTOMY ABDOMINAL WITH SALPINGECTOMY    . ULNAR NERVE TRANSPOSITION Left 08/22/2017   Procedure: ULNAR NERVE DECOMPRESSION;  Surgeon: Daryll Brod, MD;  Location: [facility];  Service: Orthopedics;  Laterality: Left;    Family History  Problem Relation Age of Onset  . Atrial fibrillation Mother   . Dementia Mother   . Hypertension Mother   . Gout Mother   . Arthritis Mother   . Stroke Mother   . COPD Father   . Emphysema Father   . Lung cancer Father   . Melanoma Father   . Hypertension Sister   . Sleep apnea Sister   . Healthy Brother   . Hypertension Brother   .  Healthy Brother   . Hypertension Brother   . Heart attack Maternal Aunt   . Breast cancer Maternal Aunt        dx over 85  . Heart attack Maternal Uncle   . Prostate cancer Maternal Uncle        unsure if this was truly prostate cancer  . Heart attack Paternal Uncle   . Heart attack Maternal Grandfather   . Breast cancer Cousin        mat first cousin  . Breast cancer Cousin        mat first cousin  . Cancer Cousin        father's maternal first cousin with Breast, Ovarian, Uterine Cancer  . Leukemia Other        father's maternal uncle  . Leukemia Other        father's maternal uncle  . Leukemia Cousin        3 of father's maternal first  cousin    Social History   Socioeconomic History  . Marital status: Married    Spouse name: Not on file  . Number of children: 3  . Years of education: Not on file  . Highest education level: Not on file  Occupational History  . Not on file  Social Needs  . Financial resource strain: Not on file  . Food insecurity:    Worry: Not on file    Inability: Not on file  . Transportation needs:    Medical: Not on file    Non-medical: Not on file  Tobacco Use  . Smoking status: Never Smoker  . Smokeless tobacco: Never Used  Substance and Sexual Activity  . Alcohol use: No    Alcohol/week: 0.0 standard drinks  . Drug use: No  . Sexual activity: Never    Birth control/protection: Post-menopausal  Lifestyle  . Physical activity:    Days per week: Not on file    Minutes per session: Not on file  . Stress: Not on file  Relationships  . Social connections:    Talks on phone: Not on file    Gets together: Not on file    Attends religious service: Not on file    Active member of club or organization: Not on file    Attends meetings of clubs or organizations: Not on file    Relationship status: Not on file  . Intimate partner violence:    Fear of current or ex partner: Not on file    Emotionally abused: Not on file    Physically abused: Not on file    Forced sexual activity: Not on file  Other Topics Concern  . 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 Ducatte, RN, BSN                                                                                                  Outpatient Medications Prior to Visit  Medication Sig Dispense Refill  . acetaminophen (TYLENOL) 500 MG tablet Take 1,000 mg by mouth every 6 (six) hours as needed for headache (pain).    Marland Kitchen apixaban (ELIQUIS) 5 MG TABS tablet Take 1 tablet (5 mg total) by mouth 2 (two) times daily. 180 tablet 1  . conjugated estrogens (PREMARIN) vaginal cream Place 8.85 Applicatorfuls vaginally 2 (two) times a week. Please use twice weekly 42.5 g 12  . Cyanocobalamin 2500 MCG CHEW Chew 2,500 mcg by mouth daily. Vitamin B12    . diphenhydramine-acetaminophen (TYLENOL PM) 25-500 MG TABS tablet Take 2 tablets by mouth at bedtime.    . hydrochlorothiazide (HYDRODIURIL) 12.5 MG tablet TAKE 1 TABLET BY MOUTH DAILY. 90 tablet 3  . LORazepam (ATIVAN) 0.5 MG tablet Take 1 tablet (0.5 mg total) by mouth 2 (two) times daily as needed for anxiety. 60 tablet 1  . losartan (COZAAR) 50 MG tablet Take 1 tablet (50 mg total) by mouth daily. 90 tablet 1  . metoprolol succinate (TOPROL-XL) 25 MG 24 hr tablet TAKE 1 TABLET BY MOUTH EVERY DAY 90 tablet 1  . metroNIDAZOLE (METROGEL) 1 % gel Apply topically daily. (Patient taking differently: Apply 1 application topically daily as needed (rosacea). ) 45 g 0  . Multiple Vitamins-Minerals (ADULT ONE DAILY GUMMIES) CHEW Chew 2 tablets by mouth daily with lunch.    . triamcinolone cream (KENALOG) 0.1 % Apply 1 application topically 2 (two) times daily. 30 g 0   No facility-administered medications prior to visit.     Allergies  Allergen Reactions  . Dilaudid [Hydromorphone Hcl] Shortness Of Breath  . Propoxyphene Hcl Other (See Comments)    hyperventilation     ROS Review of Systems    Objective:    Physical Exam  BP 134/72   Pulse 63   Temp 97.9 F (36.6 C) (Oral)   Ht 5' 4.5" (1.638 m)   Wt 184 lb 3.2 oz (83.6 kg)   SpO2 94%   BMI 31.13 kg/m  Wt Readings from Last 3 Encounters:  10/27/18 184 lb 3.2 oz (83.6 kg)  05/28/18 172 lb (78 kg)  04/29/18 175 lb 6.4 oz (79.6 kg)   Good strength and reflexes in both lower extremities.  Some paraspinous muscle pain.  There are no preventive care reminders to display for this patient.  There are no preventive care reminders to display for this patient.  Lab Results  Component Value Date   TSH 0.81 05/24/2016   Lab Results  Component Value Date   WBC 7.6 07/05/2017   HGB 11.5 (L) 07/05/2017   HCT 35.6 (L) 07/05/2017   MCV 84.2 07/05/2017   PLT 237 07/05/2017   Lab Results  Component Value Date   NA 143 03/21/2018   K 3.9 03/21/2018   CO2 26 03/21/2018   GLUCOSE 83 03/21/2018  BUN 11 03/21/2018   CREATININE 0.75 03/21/2018   BILITOT 0.6 07/05/2017   ALKPHOS 78 07/05/2017   AST 29 07/05/2017   ALT 16 07/05/2017   PROT 7.5 07/05/2017   ALBUMIN 3.9 07/05/2017   CALCIUM 10.1 03/21/2018   ANIONGAP 4 (L) 08/20/2017   Lab Results  Component Value Date   CHOL 149 12/10/2014   Lab Results  Component Value Date   HDL 51 12/10/2014   Lab Results  Component Value Date   LDLCALC 76 12/10/2014   Lab Results  Component Value Date   TRIG 109 12/10/2014   Lab Results  Component Value Date   CHOLHDL 2.9 12/10/2014   Lab Results  Component Value Date   HGBA1C 5.6 09/11/2013      Assessment & Plan:   Problem List Items Addressed This Visit    Lumbar back pain with radiculopathy affecting right lower extremity   Relevant Medications   gabapentin (NEURONTIN) 100 MG capsule      Meds ordered this encounter  Medications  . gabapentin (NEURONTIN) 100 MG capsule    Sig: Take 1 capsule (100 mg total) by mouth 3 (three) times daily.    Dispense:  90 capsule     Refill:  12    Follow-up: No follow-ups on file.    Zenia Resides, MD

## 2018-11-21 ENCOUNTER — Other Ambulatory Visit: Payer: Self-pay | Admitting: Family Medicine

## 2018-11-21 DIAGNOSIS — M5416 Radiculopathy, lumbar region: Secondary | ICD-10-CM

## 2018-11-25 ENCOUNTER — Other Ambulatory Visit: Payer: Self-pay | Admitting: Internal Medicine

## 2018-12-15 ENCOUNTER — Telehealth: Payer: Self-pay | Admitting: Internal Medicine

## 2018-12-15 DIAGNOSIS — I10 Essential (primary) hypertension: Secondary | ICD-10-CM

## 2018-12-15 MED ORDER — LOSARTAN POTASSIUM 50 MG PO TABS: 50.0000 mg | ORAL_TABLET | Freq: Every day | ORAL | 1 refills | Status: DC

## 2018-12-15 NOTE — Telephone Encounter (Signed)
Spoke with patient and she is not transferring cardiac care at this time. Scheduled follow up in May [facility]

## 2018-12-15 NOTE — Telephone Encounter (Signed)
New message   Pt c/o medication issue:  1. Name of Medication: losartan (COZAAR) 50 MG tablet  2. How are you currently taking this medication (dosage and times per day)? 1 time daily  3. Are you having a reaction (difficulty breathing--STAT)?no   4. What is your medication issue? Patient wants to switch  from a 30 day to a 90 day supply.  [facility]

## 2018-12-15 NOTE — Telephone Encounter (Signed)
[facility] After reviewing patients last office visit from April 2019 looks like to f/u as needed and 04/29/18 mychart message new cardiologist in [place] recommended. Left message for patient to call back

## 2019-01-12 ENCOUNTER — Other Ambulatory Visit: Payer: Self-pay

## 2019-01-12 ENCOUNTER — Ambulatory Visit: Payer: Medicare Other | Admitting: Family Medicine

## 2019-01-12 VITALS — BP 125/75 | HR 65 | Temp 98.1°F | Wt 185.0 lb

## 2019-01-12 DIAGNOSIS — M5416 Radiculopathy, lumbar region: Secondary | ICD-10-CM

## 2019-01-12 DIAGNOSIS — G2581 Restless legs syndrome: Secondary | ICD-10-CM | POA: Diagnosis not present

## 2019-01-12 MED ORDER — GABAPENTIN 100 MG PO CAPS: ORAL_CAPSULE | ORAL | 3 refills | Status: DC

## 2019-01-12 MED ORDER — PRAMIPEXOLE DIHYDROCHLORIDE 0.125 MG PO TABS: 0.1250 mg | ORAL_TABLET | Freq: Every day | ORAL | 1 refills | Status: DC

## 2019-01-12 NOTE — Progress Notes (Signed)
Subjective:    Christina Hardin - 68 y.o. female MRN [id number]  Date of birth: 27-Aug-1951  CC:  Christina Hardin is here for R hip and leg pain.  HPI: - pain is located in anterior and lateral R thigh - pain starts in center of back and radiates down to knee - worst pain is in R hip and R knee - worst at night - pain is burning in quality and is accompanied by numbness - gabapentin was initially somewhat helpful at 100 mg in the morning and 200 mg at night, now takes 300 mg at night  Health Maintenance:  There are no preventive care reminders to display for this patient.  -  reports that she has never smoked. She has never used smokeless tobacco. - Review of Systems: Per HPI. - Past Medical History: Patient Active Problem List   Diagnosis Date Noted  . Lumbar back pain with radiculopathy affecting right lower extremity 10/27/2018  . Vaginal irritation 02/07/2018  . Rash and nonspecific skin eruption 09/16/2017  . Cervical lymphadenopathy 06/20/2017  . Carcinoma of breast upper outer quadrant ([place]) 11/16/2016  . Family history of breast cancer   . 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  . Bursitis of right knee 08/10/2016  . Paroxysmal atrial flutter ([place]) 07/06/2016  . Diastolic dysfunction without heart failure 07/06/2016  . Paroxysmal atrial fibrillation ([place]) 06/21/2016  . Pre-syncope 06/08/2016  . Thoracic aortic aneurysm without rupture ([place]) 06/08/2016  . Anemia, iron deficiency 05/25/2016  . H/O bariatric surgery 12/20/2014  . H/O hysterectomy with oophorectomy 10/09/2013  . Restless leg syndrome 10/09/2013  . Carpal tunnel syndrome 09/11/2013  . Osteopenia 09/11/2013  . Unspecified hereditary and idiopathic peripheral neuropathy 09/11/2013  . Routine general medical examination at a health care facility 09/11/2013  . Colon polyp 05/02/2012  . KNEE PAIN, LEFT 11/16/2008  . NEPHROLITHIASIS 09/17/2008  . HEMATURIA UNSPECIFIED 03/15/2008  . CHEST PAIN,  INTERMITTENT 09/12/2007  . OBESITY, NOS 01/30/2007  . Anxiety and depression 01/30/2007  . MIGRAINE, UNSPEC., W/O INTRACTABLE MIGRAINE 01/30/2007  . Essential hypertension 01/30/2007  . HEMORRHOIDS, NOS 01/30/2007  . REFLUX ESOPHAGITIS 01/30/2007  . DIVERTICULOSIS OF COLON 01/30/2007  . ROSACEA 01/30/2007  . BACK PAIN, LOW 01/30/2007  . 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  Commended that patient increase her dose of gabapentin as needed for symptom relief, as long as she does not go over 3600 mg/day.  Advised her to slowly add 100 mg until she finds relief, since she may not have reached her threshold of response for this medication.  Also recommended that patient can take ibuprofen infrequently if needed.  Refilled patient's gabapentin.  Restless leg syndrome Patient's leg pain is worse at night, and she also reports significant restless leg symptoms that have worsened recently.  We will try pramipexole to see if this may help her restless leg and possibly associated leg pain.  We will start at the lowest  dose of pramipexole 0.125 mg with plans to increase this dose to 0.25 mg after 1 week if the lowest dose is not effective.    Maia Breslow, M.D. 01/12/2019, 4:22 PM PGY-2, [place]

## 2019-01-12 NOTE — Assessment & Plan Note (Addendum)
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  Commended that patient increase her dose of gabapentin as needed for symptom relief, as long as she does not go over 3600 mg/day.  Advised her to slowly add 100 mg until she finds relief, since she may not have reached her threshold of response for this medication.  Also recommended that patient can take ibuprofen infrequently if needed.  Refilled patient's gabapentin.

## 2019-01-12 NOTE — Assessment & Plan Note (Signed)
Patient's leg pain is worse at night, and she also reports significant restless leg symptoms that have worsened recently.  We will try pramipexole to see if this may help her restless leg and possibly associated leg pain.  We will start at the lowest dose of pramipexole 0.125 mg with plans to increase this dose to 0.25 mg after 1 week if the lowest dose is not effective.

## 2019-01-12 NOTE — Patient Instructions (Signed)
It was nice meeting you today [name]!  For your hip and leg pain, we will try to increase her gabapentin dose.  You can take up to 12 pills/day, but you should slowly increase by 1 pill until you have more relief.  It is possible that we have not yet reached the threshold for effectiveness of this medication, so hopefully the higher dose will help you.  You can also take ibuprofen every now and then to help with your pain.  Since you seem to have an element of restless leg syndrome, we are going to try the smallest dose of pramipexole.  Please take this about 2 hours before bed and you can increase the dose to 2 pills after 1 week if it has not yet been helpful.  I hope you have a wonderful trip in [place].  If you have any questions or concerns, please feel free to call the clinic.   Be well,  Dr. Shan Levans

## 2019-02-06 ENCOUNTER — Other Ambulatory Visit: Payer: Self-pay | Admitting: Family Medicine

## 2019-02-06 DIAGNOSIS — I10 Essential (primary) hypertension: Secondary | ICD-10-CM

## 2019-02-16 ENCOUNTER — Other Ambulatory Visit: Payer: Self-pay | Admitting: Internal Medicine

## 2019-02-16 NOTE — Telephone Encounter (Signed)
Pt is a 68 yr old female who saw Dr. Debara Pickett on 04/29/18. Weight on 01/12/19 was 83.9Kg. SCr on 03/21/18 was 0.75. Will refill Eliquis 5mg  BID.

## 2019-03-04 ENCOUNTER — Other Ambulatory Visit: Payer: Self-pay | Admitting: Surgery

## 2019-03-04 DIAGNOSIS — I712 Thoracic aortic aneurysm, without rupture, unspecified: Secondary | ICD-10-CM

## 2019-03-22 ENCOUNTER — Other Ambulatory Visit: Payer: Self-pay | Admitting: Internal Medicine

## 2019-03-23 NOTE — Telephone Encounter (Signed)
Toprol XL 25 mg refilled.

## 2019-04-07 ENCOUNTER — Other Ambulatory Visit: Payer: Self-pay

## 2019-04-07 ENCOUNTER — Ambulatory Visit: Payer: Medicare Other | Admitting: Family Medicine

## 2019-04-07 ENCOUNTER — Ambulatory Visit
Admission: RE | Admit: 2019-04-07 | Discharge: 2019-04-07 | Disposition: A | Discharge status: Home / Self Care | Payer: Medicare Other | Source: Ambulatory Visit | Attending: Family Medicine | Admitting: Family Medicine

## 2019-04-07 ENCOUNTER — Encounter: Payer: Self-pay | Admitting: Family Medicine

## 2019-04-07 DIAGNOSIS — M25551 Pain in right hip: Secondary | ICD-10-CM

## 2019-04-07 MED ORDER — GABAPENTIN 300 MG PO CAPS: 600.0000 mg | ORAL_CAPSULE | Freq: Three times a day (TID) | ORAL | 6 refills | Status: DC

## 2019-04-07 MED ORDER — METHYLPREDNISOLONE ACETATE 80 MG/ML IJ SUSP
80.0000 mg | Freq: Once | INTRAMUSCULAR | Status: AC
  Administered 2019-04-07: 80 mg via INTRAMUSCULAR

## 2019-04-07 NOTE — Assessment & Plan Note (Signed)
Likely multifactorial.  Some element of right greater trochanteric bursitis  - injected. 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 Suspect significant right hip arthritis.  Check x rays.

## 2019-04-07 NOTE — Patient Instructions (Signed)
I will be interested in how much the injection helps.  If you want to Google, the term is greater trochanteric bursitis. I think your hip pain is coming from three spots:  Your back, the bursitis and arthritis of the right hip. I will call with x ray results.

## 2019-04-07 NOTE — Progress Notes (Signed)
Established Patient Office Visit  Subjective:  Patient ID: Christina Hardin, female    DOB: 1951-07-03  Age: 68 y.o. MRN: [id number]  CC:  Chief Complaint  Patient presents with  . Hip and Leg pain    HPI [name] presents for right hip pain.  Patient with longstanding low back pain thought to be causing right sided radiculopathy.  Now, worsening right hip pain, which seems to be different that the radiculopathy.  No trauma.  Doing less activity.  6 months ago should could walk 2-3 miles with her husband.  Now can only walk 1/2 mile.  Pain is centered around the right hip.  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  Not at large risk for mets.  Her low back pain is unchanged.  Gabapentin did help with her right leg pain but not helping much now.  She is on a DOAC so tylenol is her analgesic of choice.  She has not had right hip films.  Past Medical History:  Diagnosis Date  . A-fib ([place])   . Anxiety   . Breast cancer ([place]) 09/05/2016   left breast dcis  . Carpal tunnel syndrome   . Ductal carcinoma in situ (DCIS) of left breast   . Family history of breast cancer   . Gastric bypass status for obesity   . GERD (gastroesophageal reflux disease)   . Hypertension   . Personal history of chemotherapy 2017  . Rheumatoid arthritis ([place])   . Thoracic aortic aneurysm ([place])   . Thoracic aortic aneurysm [facility])     Past Surgical History:  Procedure Laterality Date  . ABDOMINAL HYSTERECTOMY    . APPENDECTOMY  1958  . BLADDER REPAIR    . BREAST BIOPSY Left   . BREAST LUMPECTOMY WITH RADIOACTIVE SEED AND SENTINEL LYMPH NODE BIOPSY Left 10/09/2016   Procedure: BREAST LUMPECTOMY WITH RADIOACTIVE SEED AND SENTINEL LYMPH NODE BIOPSY;  Surgeon: Donnie Mesa, MD;  Location: [facility];  Service: General;  Laterality: Left;  . CARPAL TUNNEL RELEASE Left 08/22/2017   Procedure: LEFT CARPAL TUNNEL RELEASE;  Surgeon: Daryll Brod, MD;  Location: [facility];  Service: Orthopedics;  Laterality: Left;  . CARPOMETACARPEL SUSPENSION PLASTY Left 08/22/2017   Procedure: SUSPENSIONPLASTY LEFT THUMB TRAPENZIUM EXCISION ABDUCTOR POLLICIS LONGUS;  Surgeon: Daryll Brod, MD;  Location: [facility];  Service: Orthopedics;  Laterality: Left;  . COLONOSCOPY W/ POLYPECTOMY    . GASTRIC BYPASS    . HYSTERECTOMY ABDOMINAL WITH SALPINGECTOMY    . ULNAR NERVE TRANSPOSITION Left 08/22/2017   Procedure: ULNAR NERVE DECOMPRESSION;  Surgeon: Daryll Brod, MD;  Location: [facility];  Service: Orthopedics;  Laterality: Left;    Family History  Problem Relation Age of Onset  . Atrial fibrillation Mother   . Dementia Mother   . Hypertension Mother   . Gout Mother   . Arthritis Mother   . Stroke Mother   . COPD Father   . Emphysema Father   . Lung cancer Father   . Melanoma Father   . Hypertension Sister   . Sleep apnea Sister   . Healthy Brother   . Hypertension Brother   . Healthy Brother   . Hypertension Brother   . Heart attack Maternal Aunt   . Breast cancer Maternal Aunt        dx over 25  . Heart attack Maternal Uncle   . Prostate cancer Maternal  Uncle        unsure if this was truly prostate cancer  . Heart attack Paternal Uncle   . Heart attack Maternal Grandfather   . Breast cancer Cousin        mat first cousin  . Breast cancer Cousin        mat first cousin  . Cancer Cousin        father's maternal first cousin with Breast, Ovarian, Uterine Cancer  . Leukemia Other        father's maternal uncle  . Leukemia Other        father's maternal uncle  . Leukemia Cousin        3 of father's maternal first cousin    Social History   Socioeconomic History  . Marital status: Married    Spouse name: Dominica Severin  . Number of children: 3  . Years of education: Not on file  . Highest education level: Not on file  Occupational History  . Not on file  Social Needs  . Financial resource strain: Not on  file  . Food insecurity:    Worry: Not on file    Inability: Not on file  . Transportation needs:    Medical: Not on file    Non-medical: Not on file  Tobacco Use  . Smoking status: Never Smoker  . Smokeless tobacco: Never Used  Substance and Sexual Activity  . Alcohol use: No    Alcohol/week: 0.0 standard drinks  . Drug use: No  . Sexual activity: Never    Birth control/protection: Post-menopausal  Lifestyle  . Physical activity:    Days per week: Not on file    Minutes per session: Not on file  . Stress: Not on file  Relationships  . Social connections:    Talks on phone: Not on file    Gets together: Not on file    Attends religious service: Not on file    Active member of club or organization: Not on file    Attends meetings of clubs or organizations: Not on file    Relationship status: Not on file  . Intimate partner violence:    Fear of current or ex partner: Not on file    Emotionally abused: Not on file    Physically abused: Not on file    Forced sexual activity: Not on file  Other Topics Concern  . 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 Silvano Rusk, RN, BSN  Outpatient Medications Prior to Visit  Medication Sig Dispense Refill  . acetaminophen (TYLENOL) 500 MG tablet Take 1,000 mg by mouth every 6 (six) hours as needed for headache (pain).    . conjugated estrogens (PREMARIN) vaginal cream Place 1.54 Applicatorfuls vaginally 2 (two) times a week. Please use twice weekly 42.5 g 12  . Cyanocobalamin 2500 MCG CHEW Chew 2,500 mcg by mouth daily. Vitamin B12    . diphenhydramine-acetaminophen (TYLENOL PM) 25-500 MG TABS tablet Take 2 tablets by mouth at bedtime.    Marland Kitchen ELIQUIS 5 MG TABS tablet TAKE 1 TABLET BY MOUTH TWICE A DAY 180 tablet 0  . hydrochlorothiazide (HYDRODIURIL) 12.5 MG tablet TAKE 1 TABLET BY MOUTH DAILY 90 tablet 3  . LORazepam (ATIVAN) 0.5 MG tablet Take 1 tablet (0.5 mg total) by mouth 2 (two) times daily as needed for anxiety. 60 tablet 1  . losartan (COZAAR) 50 MG tablet Take 1 tablet (50 mg total) by mouth daily. 90 tablet 1  . metoprolol succinate (TOPROL-XL) 25 MG 24 hr tablet TAKE 1 TABLET BY MOUTH EVERY DAY 90 tablet 0  . metroNIDAZOLE (METROGEL) 1 % gel Apply topically daily. (Patient taking differently: Apply 1 application topically daily as needed (rosacea). ) 45 g 0  . Multiple Vitamins-Minerals (ADULT ONE DAILY GUMMIES) CHEW Chew 2 tablets by mouth daily with lunch.    . pramipexole (MIRAPEX) 0.125 MG tablet Take 1 tablet (0.125 mg total) by mouth at bedtime. 30 tablet 1  . triamcinolone cream (KENALOG) 0.1 % Apply 1 application topically 2 (two) times daily. 30 g 0  . gabapentin (NEURONTIN) 100 MG capsule Take 2 capsules by mouth three times daily. 270 capsule 3   No facility-administered medications prior to visit.     Allergies  Allergen Reactions  . Dilaudid [Hydromorphone Hcl] Shortness Of Breath  . Propoxyphene Hcl Other (See Comments)    hyperventilation    ROS Review of Systems    Objective:    Physical Exam  BP 126/72   Pulse 64   SpO2 97%  Wt Readings from Last 3 Encounters:   01/12/19 185 lb (83.9 kg)  10/27/18 184 lb 3.2 oz (83.6 kg)  05/28/18 172 lb (78 kg)  Decreased lumbar lordosis in the lumbar spine. Normal DTRs, strength and sensation in both lower ext. Pain on internal and external rotation of right hip. Pain over greater trochanter.  Informed consent obtained: Injected 80mg  depomedrol + 3 cc 1% lidocaine into right greater trochanter with some initial relief.   There are no preventive care reminders to display for this patient.  There are no preventive care reminders to display for this patient.  Lab Results  Component Value Date   TSH 0.81 05/24/2016   Lab Results  Component Value Date   WBC 7.6 07/05/2017   HGB 11.5 (L) 07/05/2017   HCT 35.6 (L) 07/05/2017   MCV 84.2 07/05/2017   PLT 237 07/05/2017   Lab Results  Component Value Date   NA 143 03/21/2018   K 3.9 03/21/2018   CO2 26 03/21/2018   GLUCOSE 83 03/21/2018   BUN 11 03/21/2018   CREATININE 0.75 03/21/2018   BILITOT 0.6 07/05/2017   ALKPHOS 78 07/05/2017   AST 29 07/05/2017   ALT 16 07/05/2017   PROT 7.5 07/05/2017   ALBUMIN 3.9 07/05/2017   CALCIUM 10.1 03/21/2018   ANIONGAP 4 (L) 08/20/2017   Lab Results  Component Value Date   CHOL 149 12/10/2014   Lab Results  Component Value  Date   HDL 51 12/10/2014   Lab Results  Component Value Date   LDLCALC 76 12/10/2014   Lab Results  Component Value Date   TRIG 109 12/10/2014   Lab Results  Component Value Date   CHOLHDL 2.9 12/10/2014   Lab Results  Component Value Date   HGBA1C 5.6 09/11/2013      Assessment & Plan:   Problem List Items Addressed This Visit    Right hip pain   Relevant Medications   gabapentin (NEURONTIN) 300 MG capsule   Other Relevant Orders   DG HIP UNILAT W OR W/O PELVIS 2-3 VIEWS RIGHT      Meds ordered this encounter  Medications  . gabapentin (NEURONTIN) 300 MG capsule    Sig: Take 2 capsules (600 mg total) by mouth 3 (three) times daily.    Dispense:  180 capsule     Refill:  6    Follow-up: No follow-ups on file.    Zenia Resides, MD

## 2019-04-07 NOTE — Addendum Note (Signed)
Addended by: Katharina Caper, Joffrey Kerce D on: 04/07/2019 05:04 PM   Modules accepted: Orders

## 2019-04-21 ENCOUNTER — Ambulatory Visit: Payer: Medicare Other | Admitting: Internal Medicine

## 2019-04-30 ENCOUNTER — Other Ambulatory Visit: Payer: Self-pay | Admitting: Family Medicine

## 2019-04-30 DIAGNOSIS — M25551 Pain in right hip: Secondary | ICD-10-CM

## 2019-05-13 ENCOUNTER — Other Ambulatory Visit: Payer: Self-pay | Admitting: Internal Medicine

## 2019-05-13 NOTE — Telephone Encounter (Signed)
67, 83.9kg, 0.75 (03/2018) Last OV 04/2018 Dx PAF  Has appt scheduled June 2020 with [facility] (virtual), will arrange for labs ordered with that visit

## 2019-05-19 ENCOUNTER — Other Ambulatory Visit: Payer: Self-pay

## 2019-05-20 ENCOUNTER — Encounter: Payer: Self-pay | Admitting: Surgery

## 2019-05-20 ENCOUNTER — Ambulatory Visit
Admission: RE | Admit: 2019-05-20 | Discharge: 2019-05-20 | Disposition: A | Discharge status: Home / Self Care | Payer: Medicare Other | Source: Ambulatory Visit | Attending: Surgery | Admitting: Surgery

## 2019-05-20 ENCOUNTER — Ambulatory Visit: Payer: Medicare Other | Admitting: Surgery

## 2019-05-20 VITALS — BP 124/82 | HR 72 | Temp 97.7°F | Resp 16 | Ht 64.5 in | Wt 180.0 lb

## 2019-05-20 DIAGNOSIS — I712 Thoracic aortic aneurysm, without rupture, unspecified: Secondary | ICD-10-CM

## 2019-05-20 MED ORDER — IOPAMIDOL (ISOVUE-370) INJECTION 76%
75.0000 mL | Freq: Once | INTRAVENOUS | Status: AC | PRN
  Administered 2019-05-20: 75 mL via INTRAVENOUS

## 2019-05-20 NOTE — Progress Notes (Signed)
HPI:  This 68 year old woman returns today for follow-up of a fusiform ascending aortic aneurysm which was initially measured at 4.5 cm in 2017 and last year was measured at 4.4 cm.  She has continued to feel well without chest pain or shortness of breath.  She checks her blood pressure at home and is usually under good control.  Current Outpatient Medications  Medication Sig Dispense Refill  . acetaminophen (TYLENOL) 500 MG tablet Take 1,000 mg by mouth every 6 (six) hours as needed for headache (pain).    . conjugated estrogens (PREMARIN) vaginal cream Place 2.40 Applicatorfuls vaginally 2 (two) times a week. Please use twice weekly 42.5 g 12  . Cyanocobalamin 2500 MCG CHEW Chew 2,500 mcg by mouth daily. Vitamin B12    . diphenhydramine-acetaminophen (TYLENOL PM) 25-500 MG TABS tablet Take 2 tablets by mouth at bedtime.    Marland Kitchen ELIQUIS 5 MG TABS tablet TAKE 1 TABLET BY MOUTH TWICE A DAY 180 tablet 0  . gabapentin (NEURONTIN) 300 MG capsule Take 2 capsules (600 mg total) by mouth 3 (three) times daily. 540 capsule 0  . hydrochlorothiazide (HYDRODIURIL) 12.5 MG tablet TAKE 1 TABLET BY MOUTH DAILY 90 tablet 3  . LORazepam (ATIVAN) 0.5 MG tablet Take 1 tablet (0.5 mg total) by mouth 2 (two) times daily as needed for anxiety. 60 tablet 1  . losartan (COZAAR) 50 MG tablet Take 1 tablet (50 mg total) by mouth daily. 90 tablet 1  . metoprolol succinate (TOPROL-XL) 25 MG 24 hr tablet TAKE 1 TABLET BY MOUTH EVERY DAY 90 tablet 0  . metroNIDAZOLE (METROGEL) 1 % gel Apply topically daily. (Patient taking differently: Apply 1 application topically daily as needed (rosacea). ) 45 g 0  . Multiple Vitamins-Minerals (ADULT ONE DAILY GUMMIES) CHEW Chew 2 tablets by mouth daily with lunch.    . pramipexole (MIRAPEX) 0.125 MG tablet Take 1 tablet (0.125 mg total) by mouth at bedtime. 30 tablet 1  . triamcinolone cream (KENALOG) 0.1 % Apply 1 application topically 2 (two) times daily. 30 g 0   No current  facility-administered medications for this visit.      Physical Exam: BP 124/82 (BP Location: Left Arm, Patient Position: Sitting, Cuff Size: Large)   Pulse 72   Temp 97.7 F (36.5 C) (Other (Comment)) Comment (Src): thermal  Resp 16   Ht 5' 4.5" (1.638 m)   Wt 180 lb (81.6 kg)   SpO2 96% Comment: RA  BMI 30.42 kg/m  She looks well. Cardiac exam shows regular rate and rhythm with normal heart sounds.  There is no murmur. Lungs are clear. There is no peripheral edema.  Diagnostic Tests:  CLINICAL DATA:  Thoracic aortic aneurysm follow-up.  Asymptomatic.  EXAM: CT ANGIOGRAPHY CHEST WITH CONTRAST  TECHNIQUE: Multidetector CT imaging of the chest was performed using the standard protocol during bolus administration of intravenous contrast. Multiplanar CT image reconstructions and MIPs were obtained to evaluate the vascular anatomy. Creatinine 0.8, GFR 76.  CONTRAST:  67mL ISOVUE-370 IOPAMIDOL (ISOVUE-370) INJECTION 76%  COMPARISON:  05/27/2018  FINDINGS: Cardiovascular: Heart is normal size. No significant coronary artery calcification. Bovine arch variant again noted. No evidence of thoracic aortic dissection. The thoracic aorta is well visualized and demonstrates continued evidence of ascending thoracic aortic aneurysm with measurements as follows:  3.4 cm sinuses of Valsalva.  2.8 cm sinotubular junction.  4.2 cm mid ascending aorta.  3.5 cm distal ascending/proximal arch.  2.6 cm distal arch/proximal descending aorta.  2.4 cm  distal descending thoracic aorta.  Mediastinum/Nodes: No mediastinal or hilar adenopathy. Persistent stable somewhat enhancing 1.4 x 1.8 cm upper right paraesophageal nodule which may communicate with the adjacent right lobe of the thyroid. Suggestion of a round 5 mm hypodense focus along the superior aspect of this nodule. This may represent part of the right lobe of the thyroid containing a small hypodense thyroid nodule  versus an enlarged lymph node or parathyroid nodule. Postsurgical change left breast and axilla. Remaining mediastinal structures are unremarkable.  Lungs/Pleura: Lungs are adequately inflated without focal airspace consolidation or effusion. Minimal subpleural scarring over the lingula unchanged. Airways are normal.  Upper Abdomen: Postsurgical change over the stomach. No acute findings.  Musculoskeletal: Unremarkable.  Review of the MIP images confirms the above findings.  IMPRESSION: 1. Stable 4.2 cm thoracic aortic aneurysm with measurements as described. Recommend annual imaging followup by CTA or MRA. This recommendation follows 2010 ACCF/AHA/AATS/ACR/ASA/SCA/SCAI/SIR/STS/SVM Guidelines for the Diagnosis and Management of Patients with Thoracic Aortic Disease. Circulation. 2010; 121: T517-O160. Aortic aneurysm NOS (ICD10-I71.9).  2. Stable enhancing upper right paraesophageal nodule measuring 1.8 cm. This likely represents part of the right lobe of the thyroid and less likely enlarged lymph node or parathyroid nodule.   Electronically Signed   By: Marin Olp M.D.   On: 05/20/2019 10:17   Impression:  She has a stable fusiform ascending aortic aneurysm measured at 4.2 cm on her current CT scan.  This is still well below the surgical threshold of 5.5 cm.  I reviewed the CT images with her and answered her questions.  I stressed the importance of good blood pressure control and her blood pressure appears to be under good control today.  She does check her blood pressure at home.  Her aneurysm has been stable since 2017 and is relatively small.  I think it is reasonable to do a follow-up scan in 2 years to minimize her radiation exposure.  Recent studies have raised concern that fluoroquinolone antibiotics could be associated with an increased risk of aortic aneurysm or aortic dissection.  I advised her to avoid use of Cipro and other associated antibiotics  (flouroquinolone antibiotics )   Plan:  She will continue to follow-up with Dr. Andria Frames for her medical care.  I will plan to see her back in 2 years with a CTA of the chest.  I spent 15 minutes performing this established patient evaluation and > 50% of this time was spent face to face counseling and coordinating the care of this patient's aortic aneurysm.   Gaye Pollack, MD [facility] [phone]

## 2019-05-26 ENCOUNTER — Telehealth: Payer: Self-pay | Admitting: Internal Medicine

## 2019-05-26 NOTE — Telephone Encounter (Signed)
smartphone/ consent/ my chart/ pre reg completed °

## 2019-06-01 NOTE — Telephone Encounter (Signed)
I called pt to confirm her appt on 06-02-19 with Dr Debara Pickett.

## 2019-06-02 ENCOUNTER — Telehealth (INDEPENDENT_AMBULATORY_CARE_PROVIDER_SITE_OTHER): Payer: Medicare Other | Admitting: Internal Medicine

## 2019-06-02 ENCOUNTER — Encounter: Payer: Self-pay | Admitting: Internal Medicine

## 2019-06-02 VITALS — BP 121/77 | HR 86 | Ht 64.5 in | Wt 180.0 lb

## 2019-06-02 DIAGNOSIS — I10 Essential (primary) hypertension: Secondary | ICD-10-CM

## 2019-06-02 DIAGNOSIS — I4892 Unspecified atrial flutter: Secondary | ICD-10-CM | POA: Diagnosis not present

## 2019-06-02 DIAGNOSIS — I712 Thoracic aortic aneurysm, without rupture, unspecified: Secondary | ICD-10-CM

## 2019-06-02 NOTE — Progress Notes (Signed)
Virtual Visit via Video Note   This visit type was conducted due to national recommendations for restrictions regarding the COVID-19 Pandemic (e.g. social distancing) in an effort to limit this patient's exposure and mitigate transmission in our community.  Due to her co-morbid illnesses, this patient is at least at moderate risk for complications without adequate follow up.  This format is felt to be most appropriate for this patient at this time.  All issues noted in this document were discussed and addressed.  A limited physical exam was performed with this format.  Please refer to the patient's chart for her consent to telehealth for [facility].   Evaluation Performed:  Doxy.me video visit  Date:  06/02/2019   ID:  Christina Hardin, DOB 06/08/1951, MRN [id number]  Patient Location:  [place] [address]  Provider location:   [address] [place] [address], [place] [address]  PCP:  Zenia Resides, MD  Cardiologist:  Pixie Casino, MD Electrophysiologist:  None   Chief Complaint:  Routine follow-up  History of Present Illness:    Christina Hardin is a 68 y.o. female who presents via audio/video conferencing for a telehealth visit today.  Ms. Markwell (newly married, former Christina Hardin) is a pleasant 68 year old female I been following for history of paroxysmal atrial flutter on Eliquis.  She also has grade 2 diastolic dysfunction and some dyspnea on exertion.  She had a low rate stress test in 2017 and had normal LV wall thickness and normal left atrial size in 2017.  Other medical problems include hypertension which is well controlled as well as thoracic aortic aneurysm.  This is followed by Dr. Cyndia Bent with CT surgery and she just saw him a week ago.  He noted that her repeat CT scan was stable and is recommending follow-up in 2 years.  The patient does not have symptoms concerning for COVID-19 infection (fever, chills, cough, or new SHORTNESS OF BREATH).    Prior CV  studies:   The following studies were reviewed today:  Chart reviewed  PMHx:  Past Medical History:  Diagnosis Date  . A-fib ([place])   . Anxiety   . Breast cancer ([place]) 09/05/2016   left breast dcis  . Carpal tunnel syndrome   . Ductal carcinoma in situ (DCIS) of left breast   . Family history of breast cancer   . Gastric bypass status for obesity   . GERD (gastroesophageal reflux disease)   . Hypertension   . Personal history of chemotherapy 2017  . Rheumatoid arthritis ([place])   . Thoracic aortic aneurysm ([place])   . Thoracic aortic aneurysm [facility])     Past Surgical History:  Procedure Laterality Date  . ABDOMINAL HYSTERECTOMY    . APPENDECTOMY  1958  . BLADDER REPAIR    . BREAST BIOPSY Left   . BREAST LUMPECTOMY WITH RADIOACTIVE SEED AND SENTINEL LYMPH NODE BIOPSY Left 10/09/2016   Procedure: BREAST LUMPECTOMY WITH RADIOACTIVE SEED AND SENTINEL LYMPH NODE BIOPSY;  Surgeon: Donnie Mesa, MD;  Location: [place];  Service: General;  Laterality: Left;  . CARPAL TUNNEL RELEASE Left 08/22/2017   Procedure: LEFT CARPAL TUNNEL RELEASE;  Surgeon: Daryll Brod, MD;  Location: [place];  Service: Orthopedics;  Laterality: Left;  . CARPOMETACARPEL SUSPENSION PLASTY Left 08/22/2017   Procedure: SUSPENSIONPLASTY LEFT THUMB TRAPENZIUM EXCISION ABDUCTOR POLLICIS LONGUS;  Surgeon: Daryll Brod, MD;  Location: [place];  Service: Orthopedics;  Laterality: Left;  . COLONOSCOPY  W/ POLYPECTOMY    . GASTRIC BYPASS    . HYSTERECTOMY ABDOMINAL WITH SALPINGECTOMY    . ULNAR NERVE TRANSPOSITION Left 08/22/2017   Procedure: ULNAR NERVE DECOMPRESSION;  Surgeon: Daryll Brod, MD;  Location: [place];  Service: Orthopedics;  Laterality: Left;    FAMHx:  Family History  Problem Relation Age of Onset  . Atrial fibrillation Mother   . Dementia Mother   . Hypertension Mother   . Gout Mother   . Arthritis Mother   . Stroke Mother   . COPD  Father   . Emphysema Father   . Lung cancer Father   . Melanoma Father   . Hypertension Sister   . Sleep apnea Sister   . Healthy Brother   . Hypertension Brother   . Healthy Brother   . Hypertension Brother   . Heart attack Maternal Aunt   . Breast cancer Maternal Aunt        dx over 59  . Heart attack Maternal Uncle   . Prostate cancer Maternal Uncle        unsure if this was truly prostate cancer  . Heart attack Paternal Uncle   . Heart attack Maternal Grandfather   . Breast cancer Cousin        mat first cousin  . Breast cancer Cousin        mat first cousin  . Cancer Cousin        father's maternal first cousin with Breast, Ovarian, Uterine Cancer  . Leukemia Other        father's maternal uncle  . Leukemia Other        father's maternal uncle  . Leukemia Cousin        3 of father's maternal first cousin    SOCHx:   reports that she has never smoked. She has never used smokeless tobacco. She reports that she does not drink alcohol or use drugs.  ALLERGIES:  Allergies  Allergen Reactions  . Dilaudid [Hydromorphone Hcl] Shortness Of Breath  . Propoxyphene Hcl Other (See Comments)    hyperventilation    MEDS:  Current Meds  Medication Sig  . acetaminophen (TYLENOL) 500 MG tablet Take 1,000 mg by mouth every 6 (six) hours as needed for headache (pain).  . Cyanocobalamin 2500 MCG CHEW Chew 2,500 mcg by mouth daily. Vitamin B12  . diphenhydramine-acetaminophen (TYLENOL PM) 25-500 MG TABS tablet Take 2 tablets by mouth at bedtime as needed.   Marland Kitchen ELIQUIS 5 MG TABS tablet TAKE 1 TABLET BY MOUTH TWICE A DAY  . gabapentin (NEURONTIN) 300 MG capsule Take 2 capsules (600 mg total) by mouth 3 (three) times daily.  . hydrochlorothiazide (HYDRODIURIL) 12.5 MG tablet TAKE 1 TABLET BY MOUTH DAILY  . losartan (COZAAR) 50 MG tablet Take 1 tablet (50 mg total) by mouth daily.  . metoprolol succinate (TOPROL-XL) 25 MG 24 hr tablet TAKE 1 TABLET BY MOUTH EVERY DAY  . metroNIDAZOLE  (METROGEL) 1 % gel Apply 1 application topically as needed.  . Multiple Vitamins-Minerals (ADULT ONE DAILY GUMMIES) CHEW Chew 2 tablets by mouth daily with lunch.  . pramipexole (MIRAPEX) 0.125 MG tablet Take 1 tablet (0.125 mg total) by mouth at bedtime.     ROS: Pertinent items noted in HPI and remainder of comprehensive ROS otherwise negative.  Labs/Other Tests and Data Reviewed:    Recent Labs: No results found for requested labs within last 8760 hours.   Recent Lipid Panel Lab Results  Component Value Date/Time  CHOL 149 12/10/2014 11:17 AM   TRIG 109 12/10/2014 11:17 AM   HDL 51 12/10/2014 11:17 AM   CHOLHDL 2.9 12/10/2014 11:17 AM   LDLCALC 76 12/10/2014 11:17 AM    Wt Readings from Last 3 Encounters:  06/02/19 180 lb (81.6 kg)  05/20/19 180 lb (81.6 kg)  01/12/19 185 lb (83.9 kg)     Exam:    Vital Signs:  BP 121/77 (BP Location: Left Arm, Patient Position: Sitting, Cuff Size: Normal)   Pulse 86   Ht 5' 4.5" (1.638 m)   Wt 180 lb (81.6 kg)   BMI 30.42 kg/m    General appearance: alert and no distress Lungs: No audible respiratory difficulty Abdomen: mildly obese Extremities: extremities normal, atraumatic, no cyanosis or edema Skin: Skin color, texture, turgor normal. No rashes or lesions Neurologic: Mental status: Alert, oriented, thought content appropriate Psych: Pleasant  ASSESSMENT & PLAN:    1. Paroxysmal atrial flutter - CHADSVASC score of 4 2. Normal LVEF with grade 2 DD, normal LA size (06/2016) 3. Low risk myoview stress test (09/2016) 4. Dyspnea on exertion - related to diastolic dysfunction 5. Hypertension 6. Thoracic aortic aneurysm (4.6 cm - 2018) 7. Recent DCIS s/p lumpectomy and radiation  Mrs. Tomeo is doing well.  Over the past year she was married and now is living in [place], [place].  She reports some shortness of breath with exertion but improves with resting.  This seems to be stable.  She denies any recurrent atrial  flutter.  She is tolerating Eliquis without bleeding problems.  She had recent follow-up with her CT surgeon for thoracic aortic aneurysm which appears to be stable if not mildly improved.  Follow-up is planned for 2 years.  She wishes to continue to follow-up in [place] annually, however for acute issues, I recommended establishing with Dr. Roena Malady, cardiologist at [facility] in [place], [place].  [place] Education: The signs and symptoms of COVID-19 were discussed with the patient and how to seek care for testing (follow up with PCP or arrange E-visit).  The importance of social distancing was discussed today.  Patient Risk:   After full review of this patients clinical status, I feel that they are at least moderate risk at this time.  Time:   Today, I have spent 25 minutes with the patient with telehealth technology discussing atrial flutter, thoracic aneurysm, hypertension, dyspnea on exertion.     Medication Adjustments/Labs and Tests Ordered: Current medicines are reviewed at length with the patient today.  Concerns regarding medicines are outlined above.   Tests Ordered: No orders of the defined types were placed in this encounter.   Medication Changes: No orders of the defined types were placed in this encounter.   Disposition:  in 1 year(s)  Pixie Casino, MD, [facility], [place] Director of the Advanced Lipid Disorders &  Cardiovascular Risk Reduction Clinic Diplomate of the American Board of Clinical Lipidology Attending Cardiologist  Direct Dial: [phone]  Fax: [phone]  Website:  [email or web address]  Pixie Casino, MD  06/02/2019 9:26 AM

## 2019-06-02 NOTE — Patient Instructions (Signed)
Medication Instructions:  Your physician recommends that you continue on your current medications as directed. Please refer to the Current Medication list given to you today.  If you need a refill on your cardiac medications before your next appointment, please call your pharmacy.    Follow-Up: At [facility], you and your health needs are our priority.  As part of our continuing mission to provide you with exceptional heart care, we have created designated Provider Care Teams.  These Care Teams include your primary Cardiologist (physician) and Advanced Practice Providers (APPs -  Physician Assistants and Nurse Practitioners) who all work together to provide you with the care you need, when you need it. You will need a follow up appointment in 12 months.  Please call our office 2 months in advance to schedule this appointment.  You may see Kenneth C Hilty, MD or one of the following Advanced Practice Providers on your designated Care Team: Hao Meng, PA-C . Angela Duke, PA-C  Any Other Special Instructions Will Be Listed Below (If Applicable).    

## 2019-06-14 ENCOUNTER — Other Ambulatory Visit: Payer: Self-pay | Admitting: Internal Medicine

## 2019-06-14 DIAGNOSIS — I10 Essential (primary) hypertension: Secondary | ICD-10-CM

## 2019-06-15 ENCOUNTER — Other Ambulatory Visit: Payer: Self-pay | Admitting: Internal Medicine

## 2019-07-23 ENCOUNTER — Other Ambulatory Visit: Payer: Self-pay | Admitting: Family Medicine

## 2019-07-23 DIAGNOSIS — M25551 Pain in right hip: Secondary | ICD-10-CM

## 2019-07-30 ENCOUNTER — Other Ambulatory Visit: Payer: Self-pay | Admitting: Internal Medicine

## 2019-07-30 ENCOUNTER — Telehealth: Payer: Self-pay | Admitting: Internal Medicine

## 2019-07-30 ENCOUNTER — Other Ambulatory Visit: Payer: Self-pay | Admitting: Family Medicine

## 2019-07-30 DIAGNOSIS — Z79899 Other long term (current) drug therapy: Secondary | ICD-10-CM

## 2019-07-30 DIAGNOSIS — I480 Paroxysmal atrial fibrillation: Secondary | ICD-10-CM

## 2019-07-30 DIAGNOSIS — I4892 Unspecified atrial flutter: Secondary | ICD-10-CM

## 2019-07-30 NOTE — Telephone Encounter (Signed)
Pt coming in Tues (9-1) for bmet as pt needs refill on Eliquis ./cy

## 2019-07-30 NOTE — Telephone Encounter (Signed)
lmomed for overdue labs (April of 2019)

## 2019-07-30 NOTE — Telephone Encounter (Signed)
  Patient states she is returning a call from the nurse regarding a refill request. She states she had a message to call back to schedule lab work. I looked but did not see orders for labs. Please contact patient with further instructions.

## 2019-08-04 ENCOUNTER — Other Ambulatory Visit: Payer: Self-pay

## 2019-08-04 DIAGNOSIS — Z79899 Other long term (current) drug therapy: Secondary | ICD-10-CM

## 2019-08-04 LAB — BASIC METABOLIC PANEL
BUN/Creatinine Ratio: 33 — ABNORMAL HIGH (ref 12–28)
BUN: 28 mg/dL — ABNORMAL HIGH (ref 8–27)
CO2: 24 mmol/L (ref 20–29)
Calcium: 11.1 mg/dL — ABNORMAL HIGH (ref 8.7–10.3)
Chloride: 103 mmol/L (ref 96–106)
Creatinine, Ser: 0.84 mg/dL (ref 0.57–1.00)
GFR calc Af Amer: 83 mL/min/{1.73_m2} (ref >59)
GFR calc non Af Amer: 72 mL/min/{1.73_m2} (ref >59)
Glucose: 92 mg/dL (ref 65–99)
Potassium: 4.8 mmol/L (ref 3.5–5.2)
Sodium: 140 mmol/L (ref 134–144)

## 2019-08-05 ENCOUNTER — Telehealth: Payer: Self-pay | Admitting: Internal Medicine

## 2019-08-05 NOTE — Telephone Encounter (Signed)
New Message   Pt c/o medication issue:  1. Name of Medication: ketogenix  2. How are you currently taking this medication (dosage and times per day)?   3. Are you having a reaction (difficulty breathing--STAT)?   4. What is your medication issue? Patient is calling because she is wanting to stop this dietary supplement. She wants to know if she is able to take this medication.

## 2019-08-05 NOTE — Telephone Encounter (Signed)
Spoke with patient - she bought the Ketogenix capsules online.  When she read the warning labels it mentioned that for patients with heath concerns, they should speak with their MD before starting.  Patient has a thoracic aortic aneurysm, atrial fibrillation and hypertension.  She notes she has gained about 30 pounds since getting married and is having trouble losing it.   Explained that we would prefer she NOT use this product as we have no idea what is actually in the capsules and that most herbal/natural weight loss products are stimulants (if they do anything at all).  Offered her information about the [facility] [place] for a better weight loss option.

## 2019-08-21 ENCOUNTER — Other Ambulatory Visit: Payer: Self-pay | Admitting: Family Medicine

## 2019-08-21 DIAGNOSIS — Z853 Personal history of malignant neoplasm of breast: Secondary | ICD-10-CM

## 2019-08-24 ENCOUNTER — Other Ambulatory Visit: Payer: Self-pay | Admitting: Internal Medicine

## 2019-09-09 ENCOUNTER — Encounter: Payer: Self-pay | Admitting: *Deleted

## 2019-10-07 ENCOUNTER — Other Ambulatory Visit: Payer: Self-pay

## 2019-10-07 ENCOUNTER — Ambulatory Visit
Admission: RE | Admit: 2019-10-07 | Discharge: 2019-10-07 | Disposition: A | Discharge status: Home / Self Care | Payer: Medicare Other | Source: Ambulatory Visit | Attending: Family Medicine | Admitting: Family Medicine

## 2019-10-07 DIAGNOSIS — Z853 Personal history of malignant neoplasm of breast: Secondary | ICD-10-CM

## 2019-11-23 ENCOUNTER — Other Ambulatory Visit: Payer: Self-pay

## 2019-11-23 ENCOUNTER — Encounter: Payer: Self-pay | Admitting: Medical

## 2019-11-23 ENCOUNTER — Ambulatory Visit: Payer: Medicare Other | Admitting: Medical

## 2019-11-23 ENCOUNTER — Encounter: Payer: Self-pay | Admitting: Internal Medicine

## 2019-11-23 VITALS — BP 124/85 | HR 101 | Temp 97.6°F | Ht 64.5 in | Wt 202.8 lb

## 2019-11-23 DIAGNOSIS — I10 Essential (primary) hypertension: Secondary | ICD-10-CM

## 2019-11-23 DIAGNOSIS — I712 Thoracic aortic aneurysm, without rupture, unspecified: Secondary | ICD-10-CM

## 2019-11-23 DIAGNOSIS — I4892 Unspecified atrial flutter: Secondary | ICD-10-CM

## 2019-11-23 DIAGNOSIS — R0789 Other chest pain: Secondary | ICD-10-CM

## 2019-11-23 DIAGNOSIS — I5032 Chronic diastolic (congestive) heart failure: Secondary | ICD-10-CM

## 2019-11-23 NOTE — Patient Instructions (Signed)
Medication Instructions:  Your physician recommends that you continue on your current medications as directed. Please refer to the Current Medication list given to you today.  *If you need a refill on your cardiac medications before your next appointment, please call your pharmacy*  Lab Work: none If you have labs (blood work) drawn today and your tests are completely normal, you will receive your results only by: Marland Kitchen MyChart Message (if you have MyChart) OR . A paper copy in the mail If you have any lab test that is abnormal or we need to change your treatment, we will call you to review the results.  Testing/Procedures: none  Follow-Up: At [facility], you and your health needs are our priority.  As part of our continuing mission to provide you with exceptional heart care, we have created designated Provider Care Teams.  These Care Teams include your primary Cardiologist (physician) and Advanced Practice Providers (APPs -  Physician Assistants and Nurse Practitioners) who all work together to provide you with the care you need, when you need it.  Your next appointment:   12 month(s)  The format for your next appointment:   Either In Person or Virtual  Provider:   K. Mali Hilty, MD (or your doctor of choice in [place])
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
## 2019-11-23 NOTE — Telephone Encounter (Signed)
error 

## 2019-11-25 ENCOUNTER — Encounter: Payer: Self-pay | Admitting: Medical

## 2019-12-17 DIAGNOSIS — S82002A Unspecified fracture of left patella, initial encounter for closed fracture: Secondary | ICD-10-CM | POA: Diagnosis not present

## 2019-12-18 DIAGNOSIS — Z9884 Bariatric surgery status: Secondary | ICD-10-CM | POA: Diagnosis not present

## 2019-12-18 DIAGNOSIS — H543 Unqualified visual loss, both eyes: Secondary | ICD-10-CM | POA: Diagnosis not present

## 2019-12-18 DIAGNOSIS — I10 Essential (primary) hypertension: Secondary | ICD-10-CM | POA: Diagnosis not present

## 2019-12-18 DIAGNOSIS — M545 Low back pain: Secondary | ICD-10-CM | POA: Diagnosis not present

## 2019-12-18 DIAGNOSIS — Z6834 Body mass index (BMI) 34.0-34.9, adult: Secondary | ICD-10-CM | POA: Diagnosis not present

## 2019-12-18 DIAGNOSIS — E669 Obesity, unspecified: Secondary | ICD-10-CM | POA: Diagnosis not present

## 2019-12-18 DIAGNOSIS — Z853 Personal history of malignant neoplasm of breast: Secondary | ICD-10-CM | POA: Diagnosis not present

## 2019-12-18 DIAGNOSIS — G629 Polyneuropathy, unspecified: Secondary | ICD-10-CM | POA: Diagnosis not present

## 2019-12-18 DIAGNOSIS — I712 Thoracic aortic aneurysm, without rupture: Secondary | ICD-10-CM | POA: Diagnosis not present

## 2019-12-18 DIAGNOSIS — G2581 Restless legs syndrome: Secondary | ICD-10-CM | POA: Diagnosis not present

## 2019-12-18 DIAGNOSIS — G4700 Insomnia, unspecified: Secondary | ICD-10-CM | POA: Diagnosis not present

## 2019-12-18 DIAGNOSIS — H9193 Unspecified hearing loss, bilateral: Secondary | ICD-10-CM | POA: Diagnosis not present

## 2019-12-18 DIAGNOSIS — I4891 Unspecified atrial fibrillation: Secondary | ICD-10-CM | POA: Diagnosis not present

## 2019-12-18 DIAGNOSIS — Z7901 Long term (current) use of anticoagulants: Secondary | ICD-10-CM | POA: Diagnosis not present

## 2019-12-30 ENCOUNTER — Encounter: Payer: Self-pay | Admitting: Family Medicine

## 2019-12-30 ENCOUNTER — Ambulatory Visit (INDEPENDENT_AMBULATORY_CARE_PROVIDER_SITE_OTHER): Payer: Medicare HMO | Admitting: Family Medicine

## 2019-12-30 ENCOUNTER — Other Ambulatory Visit: Payer: Self-pay

## 2019-12-30 DIAGNOSIS — F329 Major depressive disorder, single episode, unspecified: Secondary | ICD-10-CM

## 2019-12-30 DIAGNOSIS — D508 Other iron deficiency anemias: Secondary | ICD-10-CM | POA: Diagnosis not present

## 2019-12-30 DIAGNOSIS — I10 Essential (primary) hypertension: Secondary | ICD-10-CM | POA: Diagnosis not present

## 2019-12-30 DIAGNOSIS — M25551 Pain in right hip: Secondary | ICD-10-CM

## 2019-12-30 DIAGNOSIS — F32A Depression, unspecified: Secondary | ICD-10-CM

## 2019-12-30 DIAGNOSIS — F419 Anxiety disorder, unspecified: Secondary | ICD-10-CM | POA: Diagnosis not present

## 2019-12-30 DIAGNOSIS — Z9884 Bariatric surgery status: Secondary | ICD-10-CM

## 2019-12-30 DIAGNOSIS — K635 Polyp of colon: Secondary | ICD-10-CM | POA: Diagnosis not present

## 2019-12-30 MED ORDER — LORAZEPAM 0.5 MG PO TABS: 0.5000 mg | ORAL_TABLET | Freq: Two times a day (BID) | ORAL | 3 refills | Status: DC | PRN

## 2019-12-30 MED ORDER — PAROXETINE HCL 20 MG PO TABS: 20.0000 mg | ORAL_TABLET | Freq: Every day | ORAL | 6 refills | Status: DC

## 2019-12-30 NOTE — Patient Instructions (Addendum)
I would get both COVID vaccines first before other immunizations.  You can decide what to get next. The pneumonia is a easier vaccine, less side effects.  You need one more 23.   You have had several pneumonia vaccines, so you are most likely protected.    The shingles vaccine generally has more side effects.  It is a two dose vaccine with the second dose two to six months after the first.  Make sure you plan to get it when you could be down for a couple of days.   I would suggest. Get both COVID shots first.  Wait at least two weeks after the second COVID before any other shots. Next I suggest the first shingles. Wait a month then get the pneumonia 23 vaccine Wait another month and get the second shingles vaccine.   I refilled the same medications for you that have worked in the past See me in 4-6 weeks to check how things are going.

## 2019-12-31 ENCOUNTER — Encounter: Payer: Self-pay | Admitting: Family Medicine

## 2019-12-31 LAB — LIPID PANEL
Chol/HDL Ratio: 2.8 ratio (ref 0.0–4.4)
Cholesterol, Total: 155 mg/dL (ref 100–199)
HDL: 55 mg/dL (ref >39)
LDL Chol Calc (NIH): 75 mg/dL (ref 0–99)
Triglycerides: 147 mg/dL (ref 0–149)
VLDL Cholesterol Cal: 25 mg/dL (ref 5–40)

## 2019-12-31 LAB — CBC
Hematocrit: 32.2 % — ABNORMAL LOW (ref 34.0–46.6)
Hemoglobin: 10.1 g/dL — ABNORMAL LOW (ref 11.1–15.9)
MCH: 25.8 pg — ABNORMAL LOW (ref 26.6–33.0)
MCHC: 31.4 g/dL — ABNORMAL LOW (ref 31.5–35.7)
MCV: 82 fL (ref 79–97)
Platelets: 258 10*3/uL (ref 150–450)
RBC: 3.91 x10E6/uL (ref 3.77–5.28)
RDW: 15.3 % (ref 11.7–15.4)
WBC: 7.3 10*3/uL (ref 3.4–10.8)

## 2019-12-31 LAB — CMP14+EGFR
ALT: 7 IU/L (ref 0–32)
AST: 18 IU/L (ref 0–40)
Albumin/Globulin Ratio: 1.5 (ref 1.2–2.2)
Albumin: 4.3 g/dL (ref 3.8–4.8)
Alkaline Phosphatase: 110 IU/L (ref 39–117)
BUN/Creatinine Ratio: 34 — ABNORMAL HIGH (ref 12–28)
BUN: 28 mg/dL — ABNORMAL HIGH (ref 8–27)
Bilirubin Total: 0.3 mg/dL (ref 0.0–1.2)
CO2: 25 mmol/L (ref 20–29)
Calcium: 10.6 mg/dL — ABNORMAL HIGH (ref 8.7–10.3)
Chloride: 106 mmol/L (ref 96–106)
Creatinine, Ser: 0.82 mg/dL (ref 0.57–1.00)
GFR calc Af Amer: 85 mL/min/{1.73_m2} (ref >59)
GFR calc non Af Amer: 74 mL/min/{1.73_m2} (ref >59)
Globulin, Total: 2.8 g/dL (ref 1.5–4.5)
Glucose: 87 mg/dL (ref 65–99)
Potassium: 4.1 mmol/L (ref 3.5–5.2)
Sodium: 140 mmol/L (ref 134–144)
Total Protein: 7.1 g/dL (ref 6.0–8.5)

## 2019-12-31 MED ORDER — FERROUS SULFATE 325 (65 FE) MG PO TBEC: 325.0000 mg | DELAYED_RELEASE_TABLET | Freq: Every day | ORAL | 3 refills | Status: DC

## 2019-12-31 NOTE — Assessment & Plan Note (Signed)
Will restart previous meds.  Exercise is good adjuvant therapy.  She knows she needs some behavioral change.

## 2019-12-31 NOTE — Progress Notes (Signed)
   CHIEF COMPLAINT / HPI:  Depression and other issues. 1. Hx of depression and anxiety.  Successfully treated in the past with SSRI and prn benzo.  Her life situation changed and has been off meds for several years.  This year has been tough.  Her mother died of COVID.  Remarried x 1 year.  There is tension between new husband and her adult children of her previous marriage.  She is feeling overwhelmed.  No SI or HI.  2. Obesity.  S/P bariatric surgery.  She is regaining some weight.  Not exercising.  With new husband, she is cooking and eating more robust meals.  3. Multiple aches and pains.  Left Knee.  Right hip.  Also low back pain with right leg radiculopathy.  4. Has questions about immunizations.  Will get 1st COVId I 3 days.  Also due for shingles and pneumovax 23.  Already had flu shot.  Wants to know staging.    5. Told HGb low.      OBJECTIVE: BP 128/88   Pulse 75   Wt 201 lb 9.6 oz (91.4 kg)   SpO2 100%   BMI 34.07 kg/m   Affect good and appropriat throughout. Did some motivational interviewing around handling the relationship conflict with husband and lifestyle intervention.s    ASSESSMENT / PLAN:  Anemia, iron deficiency Called with results.  Patient has mild microcytic anemia.  No gross bleeding.  Up to date on colon cancer screen.  Has had bariatric surg.  Will rx with iron and recheck in three months.  Anxiety and depression Will restart previous meds.  Exercise is good adjuvant therapy.  She knows she needs some behavioral change.  Colon polyp She is up to date on colon cancer screen.  Thus I feel comfortable in treating presumed iron deficiency anemia.  And I attribute it to her bariatric surgery.  I do not paln additional WU at this time.     H/O bariatric surgery Puts her at risk for iron deficiency.  Right hip pain We decided this is a back burner issue for now.  Will discuss at FU visit.     Zenia Resides, MD [place]

## 2019-12-31 NOTE — Assessment & Plan Note (Signed)
Called with results.  Patient has mild microcytic anemia.  No gross bleeding.  Up to date on colon cancer screen.  Has had bariatric surg.  Will rx with iron and recheck in three months.

## 2019-12-31 NOTE — Assessment & Plan Note (Signed)
She is up to date on colon cancer screen.  Thus I feel comfortable in treating presumed iron deficiency anemia.  And I attribute it to her bariatric surgery.  I do not paln additional WU at this time.

## 2019-12-31 NOTE — Assessment & Plan Note (Signed)
Puts her at risk for iron deficiency.

## 2019-12-31 NOTE — Assessment & Plan Note (Signed)
We decided this is a back burner issue for now.  Will discuss at FU visit.

## 2020-01-22 ENCOUNTER — Other Ambulatory Visit: Payer: Self-pay | Admitting: Family Medicine

## 2020-01-22 DIAGNOSIS — F329 Major depressive disorder, single episode, unspecified: Secondary | ICD-10-CM

## 2020-01-22 DIAGNOSIS — F32A Depression, unspecified: Secondary | ICD-10-CM

## 2020-02-04 ENCOUNTER — Other Ambulatory Visit: Payer: Self-pay | Admitting: Family Medicine

## 2020-02-04 DIAGNOSIS — I10 Essential (primary) hypertension: Secondary | ICD-10-CM

## 2020-03-11 ENCOUNTER — Other Ambulatory Visit: Payer: Self-pay | Admitting: Internal Medicine

## 2020-04-03 ENCOUNTER — Other Ambulatory Visit: Payer: Self-pay | Admitting: Cardiology

## 2020-04-03 ENCOUNTER — Other Ambulatory Visit: Payer: Self-pay | Admitting: Internal Medicine

## 2020-04-03 DIAGNOSIS — I10 Essential (primary) hypertension: Secondary | ICD-10-CM

## 2020-04-04 NOTE — Telephone Encounter (Signed)
Rx has been sent to the pharmacy electronically. ° °

## 2020-04-08 ENCOUNTER — Ambulatory Visit: Payer: Medicare HMO | Admitting: Physician Assistant

## 2020-04-21 ENCOUNTER — Telehealth: Payer: Self-pay | Admitting: Internal Medicine

## 2020-04-21 NOTE — Telephone Encounter (Signed)
Called patient 04/21/20 to schedule for follow up visit, no answer left message

## 2020-05-04 ENCOUNTER — Other Ambulatory Visit: Payer: Self-pay | Admitting: Family Medicine

## 2020-05-04 DIAGNOSIS — M25551 Pain in right hip: Secondary | ICD-10-CM

## 2020-05-17 ENCOUNTER — Ambulatory Visit: Payer: Medicare HMO | Admitting: Physician Assistant

## 2020-05-17 ENCOUNTER — Other Ambulatory Visit: Payer: Self-pay

## 2020-05-17 ENCOUNTER — Encounter: Payer: Self-pay | Admitting: Physician Assistant

## 2020-05-17 DIAGNOSIS — D485 Neoplasm of uncertain behavior of skin: Secondary | ICD-10-CM | POA: Diagnosis not present

## 2020-05-17 DIAGNOSIS — L2089 Other atopic dermatitis: Secondary | ICD-10-CM | POA: Diagnosis not present

## 2020-05-17 DIAGNOSIS — Z1283 Encounter for screening for malignant neoplasm of skin: Secondary | ICD-10-CM | POA: Diagnosis not present

## 2020-05-17 DIAGNOSIS — B079 Viral wart, unspecified: Secondary | ICD-10-CM | POA: Diagnosis not present

## 2020-05-17 DIAGNOSIS — L814 Other melanin hyperpigmentation: Secondary | ICD-10-CM

## 2020-05-17 DIAGNOSIS — L821 Other seborrheic keratosis: Secondary | ICD-10-CM

## 2020-05-17 DIAGNOSIS — L578 Other skin changes due to chronic exposure to nonionizing radiation: Secondary | ICD-10-CM | POA: Diagnosis not present

## 2020-05-17 DIAGNOSIS — D1800 Hemangioma unspecified site: Secondary | ICD-10-CM | POA: Diagnosis not present

## 2020-05-17 DIAGNOSIS — L219 Seborrheic dermatitis, unspecified: Secondary | ICD-10-CM

## 2020-05-17 MED ORDER — TRIAMCINOLONE ACETONIDE 0.1 % EX CREA
1.0000 | TOPICAL_CREAM | Freq: Every day | CUTANEOUS | 2 refills | Status: DC
Start: 2020-05-17 — End: 2020-07-05

## 2020-05-17 MED ORDER — TACROLIMUS 0.1 % EX OINT: TOPICAL_OINTMENT | Freq: Every day | CUTANEOUS | 5 refills | Status: DC

## 2020-05-17 MED ORDER — HYDROCORTISONE 2.5 % EX CREA: TOPICAL_CREAM | Freq: Two times a day (BID) | CUTANEOUS | 5 refills | Status: DC | PRN

## 2020-05-17 NOTE — Progress Notes (Addendum)
Follow-Up Visit   Subjective  Christina Hardin is a 69 y.o. female who presents for the following: Annual Exam (left jawline x 3 months- no itch no bleed).   The following portions of the chart were reviewed this encounter and updated as appropriate: Tobacco  Allergies  Meds  Problems  Med Hx  Surg Hx  Fam Hx      Objective  Well appearing patient in no apparent distress; mood and affect are within normal limits.  A full examination was performed including scalp, head, eyes, ears, nose, lips, neck, chest, axillae, abdomen, back, buttocks, bilateral upper extremities, bilateral lower extremities, hands, feet, fingers, toes, fingernails, and toenails. All findings within normal limits unless otherwise noted below.  Objective  Head -to toe: Head to toe  Objective  Right Forearm - Posterior: Thick crust     Objective  Left Forearm - Posterior: Thick crust     Objective  Left side of neck: Verrucous growth     Objective  Left Alar Crease, Mid Forehead, Right Alar Crease: Erythematous plaques with greasy scale.   Objective  Right Thigh - Posterior: Ill-defined pink papules/plaques with scale-crust.   Assessment & Plan  Screening exam for skin cancer Head -to toe  Yearly skin exam  Neoplasm of uncertain behavior of skin (3) Right Forearm - Posterior  Epidermal / dermal shaving  Lesion diameter (cm):  1 Informed consent: discussed and consent obtained   Timeout: patient name, date of birth, surgical site, and procedure verified   Procedure prep:  Patient was prepped and draped in usual sterile fashion Prep type:  Chlorhexidine Anesthesia: the lesion was anesthetized in a standard fashion   Anesthetic:  1% lidocaine w/ epinephrine 1-100,000 local infiltration Instrument used: DermaBlade   Hemostasis achieved with: aluminum chloride   Outcome: patient tolerated procedure well   Post-procedure details: sterile dressing applied and wound care instructions  given   Dressing type: petrolatum gauze, petrolatum and bandage    Specimen 1 - Surgical pathology Differential Diagnosis: ISK Check Margins: No  Left Forearm - Posterior  Epidermal / dermal shaving  Lesion diameter (cm):  1 Informed consent: discussed and consent obtained   Timeout: patient name, date of birth, surgical site, and procedure verified   Procedure prep:  Patient was prepped and draped in usual sterile fashion Prep type:  Chlorhexidine Anesthesia: the lesion was anesthetized in a standard fashion   Anesthetic:  1% lidocaine w/ epinephrine 1-100,000 local infiltration Instrument used: DermaBlade   Hemostasis achieved with: aluminum chloride   Outcome: patient tolerated procedure well   Post-procedure details: sterile dressing applied and wound care instructions given   Dressing type: petrolatum gauze, petrolatum and bandage    Specimen 2 - Surgical pathology Differential Diagnosis: ISK Check Margins: No  Left side of neck  Epidermal / dermal shaving  Lesion diameter (cm):  1 Informed consent: discussed and consent obtained   Timeout: patient name, date of birth, surgical site, and procedure verified   Procedure prep:  Patient was prepped and draped in usual sterile fashion Prep type:  Chlorhexidine Anesthesia: the lesion was anesthetized in a standard fashion   Anesthetic:  1% lidocaine w/ epinephrine 1-100,000 local infiltration Instrument used: DermaBlade   Hemostasis achieved with: aluminum chloride   Outcome: patient tolerated procedure well   Post-procedure details: sterile dressing applied and wound care instructions given   Dressing type: petrolatum gauze, petrolatum and bandage    Specimen 3 - Surgical pathology Differential Diagnosis: veruca Check Margins: No  Seborrheic dermatitis (3) Mid Forehead; Left Alar Crease; Right Alar Crease  tacrolimus  Ordered Medications: tacrolimus (PROTOPIC) 0.1 % ointment  Other atopic dermatitis Right  Thigh - Posterior  RX: TAC  Ordered Medications: hydrocortisone 2.5 % cream Lentigines - Scattered tan macules - Discussed due to sun exposure - Benign, observe - Call for any changes  Seborrheic Keratoses - Stuck-on, waxy, tan-brown papules and plaques  - Discussed benign etiology and prognosis. - Observe - Call for any changes  Hemangiomas - Red papules - Discussed benign nature - Observe - Call for any changes  Actinic Damage - diffuse scaly erythematous macules with underlying dyspigmentation - Recommend daily broad spectrum sunscreen SPF 30+ to sun-exposed areas, reapply every 2 hours as needed.  - Call for new or changing lesions.

## 2020-05-17 NOTE — Patient Instructions (Signed)

## 2020-06-13 ENCOUNTER — Other Ambulatory Visit: Payer: Self-pay | Admitting: Physician Assistant

## 2020-06-28 ENCOUNTER — Ambulatory Visit: Payer: Medicare HMO | Admitting: Internal Medicine

## 2020-07-05 ENCOUNTER — Other Ambulatory Visit: Payer: Self-pay | Admitting: *Deleted

## 2020-07-05 DIAGNOSIS — F329 Major depressive disorder, single episode, unspecified: Secondary | ICD-10-CM

## 2020-07-05 DIAGNOSIS — I10 Essential (primary) hypertension: Secondary | ICD-10-CM

## 2020-07-05 DIAGNOSIS — F32A Depression, unspecified: Secondary | ICD-10-CM

## 2020-07-05 MED ORDER — TRIAMCINOLONE ACETONIDE 0.1 % EX CREA: 1.0000 "application " | TOPICAL_CREAM | Freq: Every day | CUTANEOUS | 2 refills | Status: DC

## 2020-07-05 MED ORDER — LOSARTAN POTASSIUM 50 MG PO TABS: 50.0000 mg | ORAL_TABLET | Freq: Every day | ORAL | 3 refills | Status: DC

## 2020-07-05 MED ORDER — LORAZEPAM 0.5 MG PO TABS: 0.5000 mg | ORAL_TABLET | Freq: Two times a day (BID) | ORAL | 3 refills | Status: DC | PRN

## 2020-07-05 MED ORDER — METOPROLOL SUCCINATE ER 25 MG PO TB24: 25.0000 mg | ORAL_TABLET | Freq: Every day | ORAL | 3 refills | Status: DC

## 2020-07-05 MED ORDER — PRAMIPEXOLE DIHYDROCHLORIDE 0.125 MG PO TABS: 0.1250 mg | ORAL_TABLET | Freq: Every day | ORAL | 3 refills | Status: DC

## 2020-07-05 MED ORDER — PAROXETINE HCL 20 MG PO TABS: 20.0000 mg | ORAL_TABLET | Freq: Every day | ORAL | 3 refills | Status: DC

## 2020-07-07 ENCOUNTER — Other Ambulatory Visit: Payer: Self-pay | Admitting: Family Medicine

## 2020-07-07 DIAGNOSIS — G2581 Restless legs syndrome: Secondary | ICD-10-CM

## 2020-07-07 MED ORDER — PRAMIPEXOLE DIHYDROCHLORIDE 0.125 MG PO TABS: 0.1250 mg | ORAL_TABLET | Freq: Every day | ORAL | 3 refills | Status: DC

## 2020-07-07 MED ORDER — TRIAMCINOLONE ACETONIDE 0.1 % EX CREA: 1.0000 "application " | TOPICAL_CREAM | Freq: Every day | CUTANEOUS | 2 refills | Status: DC

## 2020-07-12 ENCOUNTER — Other Ambulatory Visit: Payer: Self-pay | Admitting: *Deleted

## 2020-07-12 DIAGNOSIS — I10 Essential (primary) hypertension: Secondary | ICD-10-CM

## 2020-07-12 DIAGNOSIS — M25551 Pain in right hip: Secondary | ICD-10-CM

## 2020-07-12 NOTE — Telephone Encounter (Signed)
I am not clear if she needs long term doxy.  Will message derm.

## 2020-07-13 MED ORDER — HYDROCHLOROTHIAZIDE 12.5 MG PO TABS: 12.5000 mg | ORAL_TABLET | Freq: Every day | ORAL | 3 refills | Status: DC

## 2020-07-13 MED ORDER — GABAPENTIN 300 MG PO CAPS: 600.0000 mg | ORAL_CAPSULE | Freq: Three times a day (TID) | ORAL | 3 refills | Status: DC

## 2020-07-13 NOTE — Telephone Encounter (Signed)
I refilled two meds.  I am waiting to hear from derm before I approve doxy.

## 2020-07-14 MED ORDER — DOXYCYCLINE HYCLATE 50 MG PO CAPS: ORAL_CAPSULE | ORAL | 2 refills | Status: DC

## 2020-07-14 NOTE — Telephone Encounter (Signed)
Doxy refilled

## 2020-07-14 NOTE — Addendum Note (Signed)
Addended by: Zenia Resides on: 07/14/2020 03:14 PM   Modules accepted: Orders

## 2020-07-14 NOTE — Telephone Encounter (Signed)
Christina Hardin uses this medication to treat chronic rosacea.  I am fine with her taking it long term as it is subantimicrobial and she only uses it occasionally for flares. Thank you.  Christina Hardin

## 2020-08-09 DIAGNOSIS — N3001 Acute cystitis with hematuria: Secondary | ICD-10-CM | POA: Diagnosis not present

## 2020-08-16 ENCOUNTER — Telehealth: Payer: Self-pay | Admitting: Internal Medicine

## 2020-08-16 ENCOUNTER — Ambulatory Visit: Payer: Medicare HMO | Admitting: Internal Medicine

## 2020-08-16 NOTE — Telephone Encounter (Signed)
Unfortunately, I don't know any cardiologists personally in that area [place], but not [place]. Sorry. Best of luck to her!  Dr Lemmie Evens

## 2020-08-16 NOTE — Telephone Encounter (Signed)
Christina Hardin is calling requesting a referral to a Cardiologist near [place]. She states it's close to [place]. Please advise.

## 2020-08-16 NOTE — Telephone Encounter (Signed)
Left detailed message on pt's phone per dpr with Dr.Hilty's response. Notified if she had any other questions to contact our office.

## 2020-09-06 ENCOUNTER — Other Ambulatory Visit: Payer: Self-pay | Admitting: Family Medicine

## 2020-09-06 DIAGNOSIS — N179 Acute kidney failure, unspecified: Secondary | ICD-10-CM | POA: Diagnosis not present

## 2020-09-06 DIAGNOSIS — Z853 Personal history of malignant neoplasm of breast: Secondary | ICD-10-CM | POA: Diagnosis not present

## 2020-09-06 DIAGNOSIS — Z7901 Long term (current) use of anticoagulants: Secondary | ICD-10-CM | POA: Diagnosis not present

## 2020-09-06 DIAGNOSIS — I10 Essential (primary) hypertension: Secondary | ICD-10-CM | POA: Diagnosis not present

## 2020-09-06 DIAGNOSIS — I9589 Other hypotension: Secondary | ICD-10-CM | POA: Diagnosis not present

## 2020-09-06 DIAGNOSIS — R112 Nausea with vomiting, unspecified: Secondary | ICD-10-CM | POA: Diagnosis not present

## 2020-09-06 DIAGNOSIS — N3000 Acute cystitis without hematuria: Secondary | ICD-10-CM | POA: Diagnosis not present

## 2020-09-06 DIAGNOSIS — I959 Hypotension, unspecified: Secondary | ICD-10-CM | POA: Diagnosis not present

## 2020-09-06 DIAGNOSIS — I4891 Unspecified atrial fibrillation: Secondary | ICD-10-CM | POA: Diagnosis not present

## 2020-09-06 DIAGNOSIS — I2541 Coronary artery aneurysm: Secondary | ICD-10-CM | POA: Diagnosis not present

## 2020-09-14 DIAGNOSIS — Z13820 Encounter for screening for osteoporosis: Secondary | ICD-10-CM | POA: Diagnosis not present

## 2020-09-14 DIAGNOSIS — F418 Other specified anxiety disorders: Secondary | ICD-10-CM | POA: Diagnosis not present

## 2020-09-14 DIAGNOSIS — I4891 Unspecified atrial fibrillation: Secondary | ICD-10-CM | POA: Diagnosis not present

## 2020-09-14 DIAGNOSIS — Z1231 Encounter for screening mammogram for malignant neoplasm of breast: Secondary | ICD-10-CM | POA: Diagnosis not present

## 2020-09-14 DIAGNOSIS — Z6826 Body mass index (BMI) 26.0-26.9, adult: Secondary | ICD-10-CM | POA: Diagnosis not present

## 2020-09-14 DIAGNOSIS — Z23 Encounter for immunization: Secondary | ICD-10-CM | POA: Diagnosis not present

## 2020-09-14 DIAGNOSIS — Z1211 Encounter for screening for malignant neoplasm of colon: Secondary | ICD-10-CM | POA: Diagnosis not present

## 2020-09-14 DIAGNOSIS — I10 Essential (primary) hypertension: Secondary | ICD-10-CM | POA: Diagnosis not present

## 2020-09-14 DIAGNOSIS — I712 Thoracic aortic aneurysm, without rupture: Secondary | ICD-10-CM | POA: Diagnosis not present

## 2020-09-20 DIAGNOSIS — Z1211 Encounter for screening for malignant neoplasm of colon: Secondary | ICD-10-CM | POA: Diagnosis not present

## 2020-09-20 DIAGNOSIS — I10 Essential (primary) hypertension: Secondary | ICD-10-CM | POA: Diagnosis not present

## 2020-09-20 DIAGNOSIS — Z7901 Long term (current) use of anticoagulants: Secondary | ICD-10-CM | POA: Diagnosis not present

## 2020-09-22 ENCOUNTER — Telehealth: Payer: Self-pay | Admitting: Internal Medicine

## 2020-09-22 NOTE — Telephone Encounter (Signed)
    Called pt to schedule recall, pt called back to let us know she moved and don't need appt.

## 2020-10-18 DIAGNOSIS — Z20822 Contact with and (suspected) exposure to covid-19: Secondary | ICD-10-CM | POA: Diagnosis not present

## 2020-10-18 DIAGNOSIS — Z1211 Encounter for screening for malignant neoplasm of colon: Secondary | ICD-10-CM | POA: Diagnosis not present

## 2020-10-18 DIAGNOSIS — Z01812 Encounter for preprocedural laboratory examination: Secondary | ICD-10-CM | POA: Diagnosis not present

## 2020-10-19 DIAGNOSIS — Z1211 Encounter for screening for malignant neoplasm of colon: Secondary | ICD-10-CM | POA: Diagnosis not present

## 2020-10-19 DIAGNOSIS — K640 First degree hemorrhoids: Secondary | ICD-10-CM | POA: Diagnosis not present

## 2020-10-19 DIAGNOSIS — I712 Thoracic aortic aneurysm, without rupture: Secondary | ICD-10-CM | POA: Diagnosis not present

## 2020-10-19 DIAGNOSIS — I10 Essential (primary) hypertension: Secondary | ICD-10-CM | POA: Diagnosis not present

## 2020-10-19 DIAGNOSIS — Z79899 Other long term (current) drug therapy: Secondary | ICD-10-CM | POA: Diagnosis not present

## 2020-10-19 DIAGNOSIS — I4891 Unspecified atrial fibrillation: Secondary | ICD-10-CM | POA: Diagnosis not present

## 2020-10-19 DIAGNOSIS — Z7901 Long term (current) use of anticoagulants: Secondary | ICD-10-CM | POA: Diagnosis not present

## 2020-10-24 DIAGNOSIS — I712 Thoracic aortic aneurysm, without rupture: Secondary | ICD-10-CM | POA: Diagnosis not present

## 2020-11-04 DIAGNOSIS — N390 Urinary tract infection, site not specified: Secondary | ICD-10-CM | POA: Diagnosis not present

## 2020-11-10 DIAGNOSIS — Z6830 Body mass index (BMI) 30.0-30.9, adult: Secondary | ICD-10-CM | POA: Diagnosis not present

## 2020-11-10 DIAGNOSIS — I712 Thoracic aortic aneurysm, without rupture: Secondary | ICD-10-CM | POA: Diagnosis not present

## 2020-12-05 DIAGNOSIS — Z1231 Encounter for screening mammogram for malignant neoplasm of breast: Secondary | ICD-10-CM | POA: Diagnosis not present

## 2020-12-07 DIAGNOSIS — H43821 Vitreomacular adhesion, right eye: Secondary | ICD-10-CM | POA: Diagnosis not present

## 2020-12-07 DIAGNOSIS — H25813 Combined forms of age-related cataract, bilateral: Secondary | ICD-10-CM | POA: Diagnosis not present

## 2020-12-07 DIAGNOSIS — H43813 Vitreous degeneration, bilateral: Secondary | ICD-10-CM | POA: Diagnosis not present

## 2020-12-08 DIAGNOSIS — Z7901 Long term (current) use of anticoagulants: Secondary | ICD-10-CM | POA: Diagnosis not present

## 2020-12-08 DIAGNOSIS — I712 Thoracic aortic aneurysm, without rupture: Secondary | ICD-10-CM | POA: Diagnosis not present

## 2020-12-08 DIAGNOSIS — R0789 Other chest pain: Secondary | ICD-10-CM | POA: Diagnosis not present

## 2020-12-08 DIAGNOSIS — I10 Essential (primary) hypertension: Secondary | ICD-10-CM | POA: Diagnosis not present

## 2020-12-08 DIAGNOSIS — I480 Paroxysmal atrial fibrillation: Secondary | ICD-10-CM | POA: Diagnosis not present

## 2020-12-08 DIAGNOSIS — Z6830 Body mass index (BMI) 30.0-30.9, adult: Secondary | ICD-10-CM | POA: Diagnosis not present

## 2020-12-10 ENCOUNTER — Other Ambulatory Visit: Payer: Self-pay | Admitting: Cardiology

## 2020-12-20 DIAGNOSIS — H25813 Combined forms of age-related cataract, bilateral: Secondary | ICD-10-CM | POA: Diagnosis not present

## 2020-12-26 DIAGNOSIS — M5416 Radiculopathy, lumbar region: Secondary | ICD-10-CM | POA: Diagnosis not present

## 2020-12-26 DIAGNOSIS — M9908 Segmental and somatic dysfunction of rib cage: Secondary | ICD-10-CM | POA: Diagnosis not present

## 2020-12-26 DIAGNOSIS — M4126 Other idiopathic scoliosis, lumbar region: Secondary | ICD-10-CM | POA: Diagnosis not present

## 2020-12-26 DIAGNOSIS — M5440 Lumbago with sciatica, unspecified side: Secondary | ICD-10-CM | POA: Diagnosis not present

## 2020-12-26 DIAGNOSIS — N390 Urinary tract infection, site not specified: Secondary | ICD-10-CM | POA: Diagnosis not present

## 2020-12-26 DIAGNOSIS — Z6830 Body mass index (BMI) 30.0-30.9, adult: Secondary | ICD-10-CM | POA: Diagnosis not present

## 2020-12-26 DIAGNOSIS — R319 Hematuria, unspecified: Secondary | ICD-10-CM | POA: Diagnosis not present

## 2020-12-26 DIAGNOSIS — M62838 Other muscle spasm: Secondary | ICD-10-CM | POA: Diagnosis not present

## 2020-12-26 DIAGNOSIS — E669 Obesity, unspecified: Secondary | ICD-10-CM | POA: Diagnosis not present

## 2020-12-26 DIAGNOSIS — M47816 Spondylosis without myelopathy or radiculopathy, lumbar region: Secondary | ICD-10-CM | POA: Diagnosis not present

## 2021-01-04 DIAGNOSIS — N390 Urinary tract infection, site not specified: Secondary | ICD-10-CM | POA: Diagnosis not present

## 2021-01-05 DIAGNOSIS — R3121 Asymptomatic microscopic hematuria: Secondary | ICD-10-CM | POA: Diagnosis not present

## 2021-01-05 DIAGNOSIS — R309 Painful micturition, unspecified: Secondary | ICD-10-CM | POA: Diagnosis not present

## 2021-01-05 DIAGNOSIS — R5382 Chronic fatigue, unspecified: Secondary | ICD-10-CM | POA: Diagnosis not present

## 2021-01-05 DIAGNOSIS — N952 Postmenopausal atrophic vaginitis: Secondary | ICD-10-CM | POA: Diagnosis not present

## 2021-01-11 DIAGNOSIS — N390 Urinary tract infection, site not specified: Secondary | ICD-10-CM | POA: Diagnosis not present

## 2021-01-11 DIAGNOSIS — I361 Nonrheumatic tricuspid (valve) insufficiency: Secondary | ICD-10-CM | POA: Diagnosis not present

## 2021-01-11 DIAGNOSIS — N3946 Mixed incontinence: Secondary | ICD-10-CM | POA: Diagnosis not present

## 2021-01-11 DIAGNOSIS — R0789 Other chest pain: Secondary | ICD-10-CM | POA: Diagnosis not present

## 2021-01-11 DIAGNOSIS — I351 Nonrheumatic aortic (valve) insufficiency: Secondary | ICD-10-CM | POA: Diagnosis not present

## 2021-01-11 DIAGNOSIS — R3129 Other microscopic hematuria: Secondary | ICD-10-CM | POA: Diagnosis not present

## 2021-01-11 DIAGNOSIS — I712 Thoracic aortic aneurysm, without rupture: Secondary | ICD-10-CM | POA: Diagnosis not present

## 2021-01-11 DIAGNOSIS — N952 Postmenopausal atrophic vaginitis: Secondary | ICD-10-CM | POA: Diagnosis not present

## 2021-01-11 DIAGNOSIS — I480 Paroxysmal atrial fibrillation: Secondary | ICD-10-CM | POA: Diagnosis not present

## 2021-01-12 DIAGNOSIS — N200 Calculus of kidney: Secondary | ICD-10-CM | POA: Diagnosis not present

## 2021-01-12 DIAGNOSIS — N281 Cyst of kidney, acquired: Secondary | ICD-10-CM | POA: Diagnosis not present

## 2021-01-12 DIAGNOSIS — N210 Calculus in bladder: Secondary | ICD-10-CM | POA: Diagnosis not present

## 2021-02-16 DIAGNOSIS — N200 Calculus of kidney: Secondary | ICD-10-CM | POA: Diagnosis not present

## 2021-02-16 DIAGNOSIS — N390 Urinary tract infection, site not specified: Secondary | ICD-10-CM | POA: Diagnosis not present

## 2021-02-16 DIAGNOSIS — N281 Cyst of kidney, acquired: Secondary | ICD-10-CM | POA: Diagnosis not present

## 2021-02-16 DIAGNOSIS — R3129 Other microscopic hematuria: Secondary | ICD-10-CM | POA: Diagnosis not present

## 2021-02-16 DIAGNOSIS — N952 Postmenopausal atrophic vaginitis: Secondary | ICD-10-CM | POA: Diagnosis not present

## 2021-02-16 DIAGNOSIS — Q625 Duplication of ureter: Secondary | ICD-10-CM | POA: Diagnosis not present

## 2021-02-16 DIAGNOSIS — N3946 Mixed incontinence: Secondary | ICD-10-CM | POA: Diagnosis not present

## 2021-03-06 DIAGNOSIS — H52201 Unspecified astigmatism, right eye: Secondary | ICD-10-CM | POA: Diagnosis not present

## 2021-03-06 DIAGNOSIS — H25811 Combined forms of age-related cataract, right eye: Secondary | ICD-10-CM | POA: Diagnosis not present

## 2021-03-22 DIAGNOSIS — N200 Calculus of kidney: Secondary | ICD-10-CM | POA: Diagnosis not present

## 2021-03-22 DIAGNOSIS — N289 Disorder of kidney and ureter, unspecified: Secondary | ICD-10-CM | POA: Diagnosis not present

## 2021-03-22 DIAGNOSIS — N281 Cyst of kidney, acquired: Secondary | ICD-10-CM | POA: Diagnosis not present

## 2021-04-19 ENCOUNTER — Other Ambulatory Visit: Payer: Self-pay | Admitting: *Deleted

## 2021-04-19 DIAGNOSIS — I712 Thoracic aortic aneurysm, without rupture, unspecified: Secondary | ICD-10-CM

## 2021-05-23 ENCOUNTER — Ambulatory Visit: Payer: Medicare HMO | Admitting: Physician Assistant

## 2021-05-31 ENCOUNTER — Other Ambulatory Visit: Payer: Medicare PPO

## 2021-05-31 ENCOUNTER — Encounter: Payer: Medicare HMO | Admitting: Surgery

## 2021-11-16 DIAGNOSIS — I2510 Atherosclerotic heart disease of native coronary artery without angina pectoris: Secondary | ICD-10-CM | POA: Insufficient documentation

## 2021-12-07 ENCOUNTER — Other Ambulatory Visit: Payer: Self-pay | Admitting: Family Medicine

## 2021-12-07 DIAGNOSIS — M25551 Pain in right hip: Secondary | ICD-10-CM

## 2022-11-29 ENCOUNTER — Other Ambulatory Visit: Payer: Self-pay | Admitting: Family Medicine

## 2022-11-29 DIAGNOSIS — M25551 Pain in right hip: Secondary | ICD-10-CM

## 2023-01-23 ENCOUNTER — Ambulatory Visit: Payer: Medicare PPO | Admitting: Student

## 2023-02-14 ENCOUNTER — Encounter: Payer: Self-pay | Admitting: Student

## 2023-02-14 ENCOUNTER — Other Ambulatory Visit: Payer: Self-pay

## 2023-02-14 ENCOUNTER — Ambulatory Visit: Payer: Medicare HMO | Admitting: Student

## 2023-02-14 VITALS — BP 139/74 | HR 71 | Ht 64.5 in | Wt 208.6 lb

## 2023-02-14 DIAGNOSIS — I480 Paroxysmal atrial fibrillation: Secondary | ICD-10-CM | POA: Diagnosis not present

## 2023-02-14 DIAGNOSIS — I10 Essential (primary) hypertension: Secondary | ICD-10-CM | POA: Diagnosis not present

## 2023-02-14 DIAGNOSIS — D0512 Intraductal carcinoma in situ of left breast: Secondary | ICD-10-CM | POA: Diagnosis not present

## 2023-02-14 MED ORDER — APIXABAN 5 MG PO TABS: 5.0000 mg | ORAL_TABLET | Freq: Two times a day (BID) | ORAL | 1 refills | Status: DC

## 2023-02-14 MED ORDER — LOSARTAN POTASSIUM 50 MG PO TABS: 50.0000 mg | ORAL_TABLET | Freq: Every day | ORAL | 3 refills | Status: DC

## 2023-02-14 MED ORDER — METOPROLOL SUCCINATE ER 25 MG PO TB24: 25.0000 mg | ORAL_TABLET | Freq: Every day | ORAL | 3 refills | Status: DC

## 2023-02-14 NOTE — Assessment & Plan Note (Signed)
Tolerating Eliquis well.  Sent in refill for Eliquis and metoprolol.  Rate controlled in the office.

## 2023-02-14 NOTE — Progress Notes (Signed)
    SUBJECTIVE:   CHIEF COMPLAINT / HPI:   Christina Hardin is a 72 y.o. female  presenting to establish care as a new patient.  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  She has not had a mammogram since 2020 due to moving to [place] having lost to follow-up.  She was last seen by oncology in 2018 and was instructed to follow-up annually however she had lost to follow-up from this as well.  Her husband just recently passed in December so she is hoping to reestablish care and prioritize her own health care.   PERTINENT  PMH / PSH: Reviewed   OBJECTIVE:   BP 139/74   Pulse 71   Ht 5' 4.5" (1.638 m)   Wt 208 lb 9.6 oz (94.6 kg)   SpO2 99%   BMI 35.25 kg/m   Well-appearing, no acute distress Cardio: Regular rate, regular rhythm, no murmurs on exam. Pulm: Clear, no wheezing, no crackles. No increased work of breathing Abdominal: bowel sounds present, soft, non-tender, non-distended Extremities: no peripheral edema    ASSESSMENT/PLAN:   DCIS (ductal carcinoma in situ) of breast Concerning for new onset breast pain and nodules.  Ordered diagnostic mammogram.  Will also reach out to former oncologist and try to reestablish care.  Essential hypertension Blood pressure borderline.  Will continue to recheck every visit.  For now continue losartan and metoprolol.  Refilled medications  Paroxysmal atrial fibrillation (HCC) Tolerating Eliquis well.  Sent in refill for Eliquis and  metoprolol.  Rate controlled in the office.    Darci Current, [place]

## 2023-02-14 NOTE — Assessment & Plan Note (Signed)
Blood pressure borderline.  Will continue to recheck every visit.  For now continue losartan and metoprolol.  Refilled medications

## 2023-02-14 NOTE — Patient Instructions (Addendum)
It was great to see you today!   Today we addressed: Breast tenderness and pain. I sent in an order for a mammogram.  I refilled your medications, please bring your anxiety medication with you to the next appointment.    You should return to our clinic Return in about 3 months (around 05/17/2023).  Please arrive 15 minutes before your appointment to ensure smooth check in process.    Please call the clinic at [phone] if your symptoms worsen or you have any concerns.  Thank you for allowing me to participate in your care, Dr. Darci Current [facility]

## 2023-02-14 NOTE — Assessment & Plan Note (Signed)
Concerning for new onset breast pain and nodules.  Ordered diagnostic mammogram.  Will also reach out to former oncologist and try to reestablish care.

## 2023-02-15 ENCOUNTER — Telehealth: Payer: Self-pay | Admitting: Hematology and Oncology

## 2023-02-15 ENCOUNTER — Other Ambulatory Visit: Payer: Self-pay | Admitting: Student

## 2023-02-15 DIAGNOSIS — D0512 Intraductal carcinoma in situ of left breast: Secondary | ICD-10-CM

## 2023-02-15 NOTE — Telephone Encounter (Signed)
scheduled per 3/15 referral , pt has been called and confirmed date and time. Pt is aware of location and to arrive early for check in

## 2023-02-20 ENCOUNTER — Ambulatory Visit
Admission: RE | Admit: 2023-02-20 | Discharge: 2023-02-20 | Disposition: A | Discharge status: Home / Self Care | Payer: Medicare HMO | Source: Ambulatory Visit | Attending: Family Medicine | Admitting: Family Medicine

## 2023-02-20 ENCOUNTER — Other Ambulatory Visit: Payer: Self-pay | Admitting: Student

## 2023-02-20 ENCOUNTER — Ambulatory Visit (INDEPENDENT_AMBULATORY_CARE_PROVIDER_SITE_OTHER): Payer: Medicare HMO | Admitting: Student

## 2023-02-20 ENCOUNTER — Encounter: Payer: Self-pay | Admitting: Student

## 2023-02-20 VITALS — BP 108/72 | HR 76 | Temp 98.4°F | Wt 197.0 lb

## 2023-02-20 DIAGNOSIS — I480 Paroxysmal atrial fibrillation: Secondary | ICD-10-CM

## 2023-02-20 DIAGNOSIS — R051 Acute cough: Secondary | ICD-10-CM | POA: Diagnosis not present

## 2023-02-20 DIAGNOSIS — D0512 Intraductal carcinoma in situ of left breast: Secondary | ICD-10-CM

## 2023-02-20 DIAGNOSIS — I10 Essential (primary) hypertension: Secondary | ICD-10-CM

## 2023-02-20 DIAGNOSIS — N644 Mastodynia: Secondary | ICD-10-CM

## 2023-02-20 DIAGNOSIS — R0602 Shortness of breath: Secondary | ICD-10-CM | POA: Diagnosis not present

## 2023-02-20 LAB — POC SOFIA 2 FLU + SARS ANTIGEN FIA
Influenza A, POC: NEGATIVE
Influenza B, POC: NEGATIVE
SARS Coronavirus 2 Ag: NEGATIVE

## 2023-02-20 NOTE — Progress Notes (Unsigned)
  SUBJECTIVE:   CHIEF COMPLAINT / HPI:   She thought it was allergies. Started with runny nose. Endorses wheezing and crackles in her chest, sore throat x5-6 days. Coughing all night long. Temperature of 101 for the last several. She has coughed up some mucus yellow/green with trace blood. Did not home test for flu/covid. No sick contacts that she is aware of. Appetite is poor.  Denies nausea/vomiting/diarrhea.  Does not have smoking history.   PERTINENT  PMH / PSH: HTN, DCIS of breast, paroxysmal A-fib  OBJECTIVE:  BP 108/72   Pulse 76   Temp 98.4 F (36.9 C) (Oral)   Wt 197 lb (89.4 kg)   SpO2 98%   BMI 33.29 kg/m  General: Fatigued appearing, NAD HEENT: 1 cm left anterior cervical lymph node, clear oropharynx, erythematous nasal turbinates bilaterally CV: RRR, no murmurs auscultated Pulm: Crackles in right lower lobe, no wheezing appreciated, normal WOB Abdomen: Soft, nontender, normoactive bowel sounds  ASSESSMENT/PLAN:  Acute cough Assessment & Plan: Considered acute etiology, however given constellation of symptoms most likely infectious viral versus bacterial.  Flu COVID-negative.  Given age and physical exam, will further explore with chest x-ray.  Precautions discussed.  Advised if pneumonia or she experiences double sickening afterwards, antibiotics would be expected.  Orders: -     POC SOFIA 2 FLU + SARS ANTIGEN FIA -     DG Chest 2 View; Future  Return if symptoms worsen or fail to improve. [name], DO 02/21/2023, 8:48 AM PGY-2, [place]

## 2023-02-20 NOTE — Assessment & Plan Note (Signed)
Flu COVID-negative.  Given age and physical exam, will further explore with chest x-ray.  Precautions discussed.  Advised if pneumonia or she experiences double sickening afterwards, antibiotics would be expected.

## 2023-02-20 NOTE — Patient Instructions (Signed)
It was great to see you today! Thank you for choosing [facility] for your primary care. Christina Hardin was seen for shortness of breath.  Today we addressed: Your viral testing was negative.  This may still be viral even if it is not flu or COVID. Let's get a chest x-ray to look for pneumonia.   If you haven't already, sign up for My Chart to have easy access to your labs results, and communication with your primary care physician.  I have placed an order for chest xray.  Please go to [place] at [employer] or at [facility] to have this completed.  You do not need an appointment, but if you would like to call them beforehand, their number is [phone].  We will contact you with your results afterwards.   We are checking some labs today. If they are abnormal, I will call you. If they are normal, I will send you a MyChart message (if it is active) or a letter in the mail. If you do not hear about your labs in the next 2 weeks, please call the office. Call the clinic at [phone] if your symptoms worsen or you have any concerns.  You should return to our clinic Return if symptoms worsen or fail to improve. Please arrive 15 minutes before your appointment to ensure smooth check in process.  We appreciate your efforts in making this happen.  Thank you for allowing me to participate in your care, [name], DO 02/20/2023, 12:22 PM PGY-2, [place]

## 2023-02-25 ENCOUNTER — Other Ambulatory Visit: Payer: Self-pay | Admitting: Student

## 2023-02-25 ENCOUNTER — Ambulatory Visit
Admission: RE | Admit: 2023-02-25 | Discharge: 2023-02-25 | Disposition: A | Discharge status: Home / Self Care | Payer: Medicare HMO | Source: Ambulatory Visit | Attending: Family Medicine | Admitting: Family Medicine

## 2023-02-25 ENCOUNTER — Telehealth: Payer: Self-pay | Admitting: Student

## 2023-02-25 DIAGNOSIS — Z853 Personal history of malignant neoplasm of breast: Secondary | ICD-10-CM | POA: Diagnosis not present

## 2023-02-25 DIAGNOSIS — N644 Mastodynia: Secondary | ICD-10-CM | POA: Diagnosis not present

## 2023-02-25 DIAGNOSIS — D0512 Intraductal carcinoma in situ of left breast: Secondary | ICD-10-CM

## 2023-02-25 DIAGNOSIS — J189 Pneumonia, unspecified organism: Secondary | ICD-10-CM

## 2023-02-25 MED ORDER — AMOXICILLIN-POT CLAVULANATE 875-125 MG PO TABS: 1.0000 | ORAL_TABLET | Freq: Two times a day (BID) | ORAL | 0 refills | Status: AC

## 2023-02-25 MED ORDER — AZITHROMYCIN 250 MG PO TABS: ORAL_TABLET | ORAL | 0 refills | Status: DC

## 2023-02-25 NOTE — Telephone Encounter (Signed)
CXR shows multifocal pneumonia. Will treat with augmentin and azithromycin. Attempted to call patient, unable to reach. Left HIPAA compliant VM. Advised to call clinic if she has questions. Already sent medications as I did not want this to delay treatment.

## 2023-02-26 ENCOUNTER — Telehealth: Payer: Self-pay | Admitting: Student

## 2023-02-26 NOTE — Telephone Encounter (Signed)
Called patient to discuss results of mammogram and U/S breast which were both negative for malignancy. Patient will need follow up in 1 year for re-screening.   Darci Current, DO [facility], PGY-1 02/26/23 1:49 PM

## 2023-03-07 ENCOUNTER — Ambulatory Visit (INDEPENDENT_AMBULATORY_CARE_PROVIDER_SITE_OTHER): Payer: Medicare HMO | Admitting: Family Medicine

## 2023-03-07 ENCOUNTER — Inpatient Hospital Stay: Payer: Medicare HMO | Attending: Hematology and Oncology | Admitting: Hematology and Oncology

## 2023-03-07 VITALS — BP 129/73 | HR 63 | Ht 64.5 in | Wt 199.2 lb

## 2023-03-07 DIAGNOSIS — J189 Pneumonia, unspecified organism: Secondary | ICD-10-CM | POA: Insufficient documentation

## 2023-03-07 DIAGNOSIS — M25551 Pain in right hip: Secondary | ICD-10-CM | POA: Diagnosis not present

## 2023-03-07 DIAGNOSIS — I480 Paroxysmal atrial fibrillation: Secondary | ICD-10-CM | POA: Diagnosis not present

## 2023-03-07 MED ORDER — RIVAROXABAN 20 MG PO TABS: 20.0000 mg | ORAL_TABLET | Freq: Every day | ORAL | 0 refills | Status: DC

## 2023-03-07 MED ORDER — GABAPENTIN 300 MG PO CAPS: 600.0000 mg | ORAL_CAPSULE | Freq: Three times a day (TID) | ORAL | 0 refills | Status: DC

## 2023-03-07 NOTE — Assessment & Plan Note (Signed)
Prior office note from 02/14/23 report patient was on Eliquis.  This is incorrect. Patient brings current medication list which includes Xarelto.  Confirmed pharmacy dispense for 108-month supply in January 2024 in [place].  Refill sent for Xarelto 20 mg daily.  Patient knows not to take Eliquis that was previously sent in on 02/14/23.  Advised she reestablish with Dr. Debara Pickett for ongoing follow-up (she will call us if she needs new referral).

## 2023-03-07 NOTE — Patient Instructions (Addendum)
It was great to meet you!  You have a pneumonia. There are 2 antibiotics at the pharmacy for you. Take these as prescribed until they are completely gone.  It can take weeks for the cough to go away completely. However, if you're not improving in ~2 weeks please let us know.  Seek help right away if you develop difficulty breathing.  I sent refills on your Xarelto and Gabapentin.  I recommend you re-establish with Dr. Lysbeth Penner office. [phone] is their phone number. IF you need a new referral please let us know.  -Dr Rock Nephew

## 2023-03-07 NOTE — Progress Notes (Signed)
    SUBJECTIVE:   CHIEF COMPLAINT / HPI:   Cough -diagnosed with multifocal PNA (worst on  L) based on CXR on 02/20/23 -rx was sent for augmentin and azithromycin -patient was unaware of this, did not pick up medications -still coughing -no shortness of breath -fever initially but none recently -no chest pain  Med refills Patient requesting refills on her Xarelto and gabapentin. Moved back to [place] a few months ago.  PERTINENT  PMH / PSH: A-fib, HTN, DCIS  OBJECTIVE:   BP 129/73   Pulse 63   Ht 5' 4.5" (1.638 m)   Wt 199 lb 3.2 oz (90.4 kg)   SpO2 100%   BMI 33.66 kg/m   Gen: NAD, pleasant, able to participate in exam CV: RRR, normal S1/S2, no murmur Resp: Normal effort on room air, coughing intermittently, slightly diminished breath sounds at R base, slight crackles L base Extremities: no edema or cyanosis Skin: warm and dry, no rashes noted Neuro: alert, no obvious focal deficits Psych: Normal affect and mood   ASSESSMENT/PLAN:   Multifocal pneumonia Ongoing cough likely secondary to multifocal pneumonia diagnosed on 02/20/23 CXR.  No red flags.  Curb-65 score is low.  Appropriate for outpatient management.  Advised patient pick up Augmentin and Azithromycin which were previously prescribed.  Confirmed with her pharmacy these are waiting for pickup.  ED and return precautions reviewed.  Paroxysmal atrial fibrillation ([place]) Prior office note from 02/14/23 report patient was on Eliquis.  This is incorrect. Patient brings current medication list which includes Xarelto.  Confirmed pharmacy dispense for 76-month supply in January 2024 in [place].  Refill sent for Xarelto 20 mg daily.  Patient knows not to take Eliquis that was previously sent in on 02/14/23.  Advised she reestablish with Dr. Debara Pickett for ongoing follow-up (she will call us if she needs new referral).     Alcus Dad, MD [place]

## 2023-03-07 NOTE — Assessment & Plan Note (Signed)
Ongoing cough likely secondary to multifocal pneumonia diagnosed on 02/20/23 CXR.  No red flags.  Curb-65 score is low.  Appropriate for outpatient management.  Advised patient pick up Augmentin and Azithromycin which were previously prescribed.  Confirmed with her pharmacy these are waiting for pickup.  ED and return precautions reviewed.

## 2023-03-07 NOTE — Addendum Note (Signed)
Addended by: Alcus Dad on: 03/07/2023 02:50 PM   Modules accepted: Orders

## 2023-03-15 ENCOUNTER — Telehealth: Payer: Self-pay | Admitting: Student

## 2023-03-15 NOTE — Telephone Encounter (Signed)
Contacted Christina Hardin to schedule their annual wellness visit. Appointment made for 03/19/2023.  Thank you,  [facility] Support [facility] Direct dial  [phone]

## 2023-03-19 ENCOUNTER — Ambulatory Visit (INDEPENDENT_AMBULATORY_CARE_PROVIDER_SITE_OTHER): Payer: Medicare HMO

## 2023-03-19 DIAGNOSIS — Z0000 Encounter for general adult medical examination without abnormal findings: Secondary | ICD-10-CM | POA: Diagnosis not present

## 2023-03-19 NOTE — Progress Notes (Signed)
I connected with  Christina Hardin on 03/19/23 by a audio enabled telemedicine application and verified that I am speaking with the correct person using two identifiers.  Patient Location: Home  Provider Location: Home Office  I discussed the limitations of evaluation and management by telemedicine. The patient expressed understanding and agreed to proceed.   Subjective:   Christina Hardin is a 72 y.o. female who presents for Medicare Annual (Subsequent) preventive examination.  Review of Systems    Per HPI unless specifically indicated below.  Cardiac Risk Factors include: advanced age (>40men, >12 women);female gender, Essential Hypertension.          Objective:       03/07/2023   11:13 AM 02/20/2023   11:26 AM 02/14/2023    2:47 PM  Vitals with BMI  Height 5' 4.5"  5' 4.5"  Weight 199 lbs 3 oz 197 lbs 208 lbs 10 oz  BMI 33.68 33.31 35.27  Systolic 129 108 161  Diastolic 73 72 74  Pulse 63 76 71    Today's Vitals   03/19/23 1100  PainSc: 0-No pain   There is no height or weight on file to calculate BMI.     03/07/2023   11:58 AM 02/20/2023   11:24 AM 02/14/2023    2:49 PM 01/12/2019    1:45 PM 09/16/2017    9:27 AM 08/22/2017    9:55 AM 08/15/2017    4:38 PM  Advanced Directives  Does Patient Have a Medical Advance Directive? No No No No Yes Yes Yes  Type of Agricultural consultant;Living will Living will;Healthcare Power of [employer] Power of [place];Living will  Does patient want to make changes to medical advance directive?     No - Patient declined No - Patient declined No - Patient declined  Copy of Healthcare Power of Attorney in Chart?     No - copy requested No - copy requested No - copy requested  Would patient like information on creating a medical advance directive?  No - Patient declined No - Patient declined No - Patient declined       Current Medications (verified) Outpatient Encounter Medications as of 03/19/2023   Medication Sig   acetaminophen (TYLENOL) 500 MG tablet Take 1,000 mg by mouth every 6 (six) hours as needed for headache (pain).   alendronate (FOSAMAX) 35 MG tablet Take 35 mg by mouth once a week.   amLODipine (NORVASC) 5 MG tablet Take 5 mg by mouth daily. PRN   Cyanocobalamin 2500 MCG CHEW Chew 2,500 mcg by mouth daily. Vitamin B12   cyclobenzaprine (FLEXERIL) 10 MG tablet Take 10 mg by mouth 3 (three) times daily as needed for muscle spasms.   diphenhydramine-acetaminophen (TYLENOL PM) 25-500 MG TABS tablet Take 2 tablets by mouth at bedtime as needed.    escitalopram (LEXAPRO) 10 MG tablet Take 10 mg by mouth daily.   gabapentin (NEURONTIN) 300 MG capsule Take 2 capsules (600 mg total) by mouth 3 (three) times daily.   losartan (COZAAR) 50 MG tablet Take 1 tablet (50 mg total) by mouth daily. (Patient taking differently: Take 100 mg by mouth daily.)   metoprolol succinate (TOPROL-XL) 25 MG 24 hr tablet Take 1 tablet (25 mg total) by mouth daily.   rivaroxaban (XARELTO) 20 MG TABS tablet Take 1 tablet (20 mg total) by mouth daily with supper. With a meal   [DISCONTINUED] azithromycin (ZITHROMAX Z-PAK) 250 MG tablet 2 tablets on  the first day, followed by 1 tablet every day (Patient not taking: Reported on 03/19/2023)   No facility-administered encounter medications on file as of 03/19/2023.    Allergies (verified) Dilaudid [hydromorphone hcl] and Propoxyphene hcl   History: Past Medical History:  Diagnosis Date   A-fib    Anxiety    Breast cancer 09/05/2016   left breast dcis   Carpal tunnel syndrome    Ductal carcinoma in situ (DCIS) of left breast    Family history of breast cancer    Gastric bypass status for obesity    GERD (gastroesophageal reflux disease)    Hypertension    Personal history of chemotherapy 2017   Rheumatoid arthritis    Thoracic aortic aneurysm    Thoracic aortic aneurysm    Past Surgical History:  Procedure Laterality Date   ABDOMINAL HYSTERECTOMY      APPENDECTOMY  1958   BLADDER REPAIR     BREAST BIOPSY Left    BREAST LUMPECTOMY WITH RADIOACTIVE SEED AND SENTINEL LYMPH NODE BIOPSY Left 10/09/2016   Procedure: BREAST LUMPECTOMY WITH RADIOACTIVE SEED AND SENTINEL LYMPH NODE BIOPSY;  Surgeon: Manus Rudd, MD;  Location: [facility];  Service: General;  Laterality: Left;   CARPAL TUNNEL RELEASE Left 08/22/2017   Procedure: LEFT CARPAL TUNNEL RELEASE;  Surgeon: Cindee Salt, MD;  Location: [facility];  Service: Orthopedics;  Laterality: Left;   CARPOMETACARPEL SUSPENSION PLASTY Left 08/22/2017   Procedure: SUSPENSIONPLASTY LEFT THUMB TRAPENZIUM EXCISION ABDUCTOR POLLICIS LONGUS;  Surgeon: Cindee Salt, MD;  Location: [facility];  Service: Orthopedics;  Laterality: Left;   COLONOSCOPY W/ POLYPECTOMY     GASTRIC BYPASS     HYSTERECTOMY ABDOMINAL WITH SALPINGECTOMY     ULNAR NERVE TRANSPOSITION Left 08/22/2017   Procedure: ULNAR NERVE DECOMPRESSION;  Surgeon: Cindee Salt, MD;  Location: [facility];  Service: Orthopedics;  Laterality: Left;   Family History  Problem Relation Age of Onset   Atrial fibrillation Mother    Dementia Mother    Hypertension Mother    Gout Mother    Arthritis Mother    Stroke Mother    COPD Father    Emphysema Father    Lung cancer Father    Melanoma Father    Hypertension Sister    Sleep apnea Sister    Healthy Brother    Hypertension Brother    Healthy Brother    Hypertension Brother    Heart attack Maternal Aunt    Breast cancer Maternal Aunt        dx over 50   Heart attack Maternal Uncle    Prostate cancer Maternal Uncle        unsure if this was truly prostate cancer   Heart attack Paternal Uncle    Heart attack Maternal Grandfather    Breast cancer Cousin        mat first cousin   Breast cancer Cousin        mat first cousin   Cancer Cousin        father's maternal first cousin with Breast, Ovarian, Uterine Cancer   Leukemia Other         father's maternal uncle   Leukemia Other        father's maternal uncle   Leukemia Cousin        3 of father's maternal first cousin   Social History   Socioeconomic History   Marital status: Widowed    Spouse name: Christina Hardin  Number of children: 3   Years of education: Not on file   Highest education level: Not on file  Occupational History    Comment: Retired  Tobacco Use   Smoking status: Never    Passive exposure: Never   Smokeless tobacco: Never  Vaping Use   Vaping Use: Never used  Substance and Sexual Activity   Alcohol use: No    Alcohol/week: 0.0 standard drinks of alcohol   Drug use: No   Sexual activity: Never    Birth control/protection: Post-menopausal  Other Topics Concern   Not on file  Social History Narrative   Epworth Sleepiness Scale Score: 10       Current Social History 08/15/2017        Who lives at home: Patient lives alone in one level home 08/15/2017       Transportation: Patient has own vehicle  08/15/2017   Important Relationships: Fiance, 3 sons, 10 grandkids, mother, 2 brothers, 1 sister and their families and many friends 08/15/2017    Pets: None 08/15/2017   Education / Work:  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 Ducatte, RN, BSN       Lives with middle son, Barbara Cower. Has a dog Rascal that is sick - might have to put him down                                                                                                 Social Determinants of Health   Financial Resource Strain: Low Risk  (03/19/2023)   Overall Financial Resource Strain (CARDIA)    Difficulty of Paying Living Expenses: Not hard at all  Food Insecurity: No Food  Insecurity (03/19/2023)   Hunger Vital Sign    Worried About Running Out of Food in the Last Year: Never true    Ran Out of Food in the Last Year: Never true  Transportation Needs: No Transportation Needs (03/19/2023)   PRAPARE - Administrator, Civil Service (Medical): No    Lack of Transportation (Non-Medical): No  Physical Activity: Inactive (03/19/2023)   Exercise Vital Sign    Days of Exercise per Week: 0 days    Minutes of Exercise per Session: 0 min  Stress: No Stress Concern Present (03/19/2023)   [employer] of Occupational Health - Occupational Stress Questionnaire    Feeling of Stress : Not at all  Social Connections: Moderately Integrated (03/19/2023)   Social Connection and Isolation Panel [NHANES]    Frequency of Communication with Friends and Family: Twice a week    Frequency of Social Gatherings with Friends and Family: Once a week    Attends Religious Services: More than 4 times per year    Active Member of [employer] or Organizations: Yes    Attends Banker  Meetings: Never    Marital Status: Widowed    Tobacco Counseling Counseling given: No   Clinical Intake:  Pre-visit preparation completed: No  Pain : No/denies pain Pain Score: 0-No pain     Nutritional Status: BMI 25 -29 Overweight Nutritional Risks: None  How often do you need to have someone help you when you read instructions, pamphlets, or other written materials from your doctor or pharmacy?: 1 - Never  Diabetic?No  Interpreter Needed?: No  Information entered by :: Laurel Dimmer, CMA   Activities of Daily Living    03/19/2023   10:59 AM  In your present state of health, do you have any difficulty performing the following activities:  Hearing? 1  Comment tinnutis  Vision? 0  Difficulty concentrating or making decisions? 0  Walking or climbing stairs? 0  Dressing or bathing? 0  Doing errands, shopping? 0    Patient Care Team: Glendale Chard, DO as PCP -  General (Family Medicine) Rennis Golden Lisette Abu, MD as PCP - Cardiology (Cardiology) Serena Croissant, MD as Consulting Physician (Hematology and Oncology) Glenna Fellows, MD as Consulting Physician (Plastic Surgery) Dorothy Puffer, MD as Consulting Physician (Radiation Oncology) Axel Filler, Larna Daughters, NP as Nurse Practitioner (Hematology and Oncology) Manus Rudd, MD as Consulting Physician (General Surgery) Rennis Golden Lisette Abu, MD as Consulting Physician (Cardiology) Glyn Ade, PA-C as Physician Assistant (Dermatology)  Indicate any recent Medical Services you may have received from other than [facility] providers in the past year (date may be approximate).     Assessment:   This is a routine wellness examination for Meena.   Hearing/Vision screen Denies any hearing issues. Denies any change to her vision.  Dietary issues and exercise activities discussed: Current Exercise Habits: The patient does not participate in regular exercise at present, Exercise limited by: None identified   Goals Addressed   None    Depression Screen    03/19/2023   10:58 AM 03/07/2023   11:57 AM 02/20/2023   11:27 AM 12/30/2019    2:04 PM 04/07/2019    9:45 AM 01/12/2019    1:44 PM 10/27/2018   10:44 AM  PHQ 2/9 Scores  PHQ - 2 Score 0 0 1 2 1  0 0  PHQ- 9 Score  5 4        Fall Risk    03/19/2023   10:59 AM 02/20/2023   11:27 AM 02/14/2023    2:49 PM 12/30/2019    2:04 PM 04/07/2019    9:46 AM  Fall Risk   Falls in the past year? 0 1 1 0 0  Number falls in past yr: 0 0 1 0 0  Injury with Fall? 0 1 1    Risk for fall due to : No Fall Risks History of fall(s);Impaired balance/gait     Follow up Falls evaluation completed Falls prevention discussed  Falls evaluation completed Falls evaluation completed    FALL RISK PREVENTION PERTAINING TO THE HOME:  Any stairs in or around the home? No  If so, are there any without handrails? No  Home free of loose throw rugs in walkways, pet beds, electrical  cords, etc? Yes  Adequate lighting in your home to reduce risk of falls? Yes   ASSISTIVE DEVICES UTILIZED TO PREVENT FALLS:  Life alert? No  Use of a cane, walker or w/c? No  Grab bars in the bathroom? Yes  Shower chair or bench in shower? Yes  Elevated toilet seat or a handicapped toilet? No   TIMED  UP AND GO:  Was the test performed?Unable to perform, virtual appointment   Cognitive Function:        03/19/2023   11:02 AM  6CIT Screen  What Year? 0 points  What month? 0 points  What time? 0 points  Count back from 20 0 points  Months in reverse 0 points  Repeat phrase 0 points  Total Score 0 points    Immunizations Immunization History  Administered Date(s) Administered   Influenza Split 09/20/2011   Influenza Whole 09/12/2007, 10/14/2009   Influenza, High Dose Seasonal PF 08/01/2019   Influenza,inj,Quad PF,6+ Mos 09/11/2013, 10/09/2013, 12/10/2014, 08/09/2016, 08/15/2017   Influenza-Unspecified 09/16/2018   Moderna Covid-19 Vaccine Bivalent Booster 61yrs & up 10/03/2021   Moderna Sars-Covid-2 Vaccination 10/11/2020, 04/02/2021   Pneumococcal Conjugate-13 06/21/2016   Pneumococcal Polysaccharide-23 07/03/2002, 12/10/2014   Td 12/03/1998   Tdap 11/23/2011, 10/09/2013   Zoster Recombinat (Shingrix) 04/02/2021   Zoster, Live 11/08/2011    TDAP status: Up to date  Flu Vaccine status: Up to date  Pneumococcal vaccine status: Due, Education has been provided regarding the importance of this vaccine. Advised may receive this vaccine at local pharmacy or Health Dept. Aware to provide a copy of the vaccination record if obtained from local pharmacy or Health Dept. Verbalized acceptance and understanding.  Covid-19 vaccine status: Information provided on how to obtain vaccines.   Qualifies for Shingles Vaccine? Yes   Zostavax completed No   Shingrix Completed?: No.    Education has been provided regarding the importance of this vaccine. Patient has been advised to call  insurance company to determine out of pocket expense if they have not yet received this vaccine. Advised may also receive vaccine at local pharmacy or Health Dept. Verbalized acceptance and understanding.  Screening Tests Health Maintenance  Topic Date Due   Zoster Vaccines- Shingrix (2 of 2) 05/28/2021   Pneumonia Vaccine 40+ Years old (3 of 3 - PPSV23 or PCV20) 06/21/2021   COLONOSCOPY (Pts 45-60yrs Insurance coverage will need to be confirmed)  04/30/2022   COVID-19 Vaccine (4 - 2023-24 season) 08/03/2022   INFLUENZA VACCINE  07/04/2023   DTaP/Tdap/Td (4 - Td or Tdap) 10/10/2023   Medicare Annual Wellness (AWV)  03/18/2024   MAMMOGRAM  02/24/2025   DEXA SCAN  Completed   Hepatitis C Screening  Completed   HPV VACCINES  Aged Out    Health Maintenance  Health Maintenance Due  Topic Date Due   Zoster Vaccines- Shingrix (2 of 2) 05/28/2021   Pneumonia Vaccine 32+ Years old (3 of 3 - PPSV23 or PCV20) 06/21/2021   COLONOSCOPY (Pts 45-36yrs Insurance coverage will need to be confirmed)  04/30/2022   COVID-19 Vaccine (4 - 2023-24 season) 08/03/2022    Colorectal Cancer Screening: decline   Mammogram status: Completed 02/25/2023. Repeat every year  DEXA Scan: 09/23/2013  Lung Cancer Screening: (Low Dose CT Chest recommended if Age 37-80 years, 30 pack-year currently smoking OR have quit w/in 15years.) does not qualify.   Lung Cancer Screening Referral: not applicable   Additional Screening:  Hepatitis C Screening: does qualify; Completed 05/24/2016  Vision Screening: Recommended annual ophthalmology exams for early detection of glaucoma and other disorders of the eye. Is the patient up to date with their annual eye exam?  Yes  Who is the provider or what is the name of the office in which the patient attends annual eye exams?  If pt is not established with a provider, would they like to be referred to a  provider to establish care? No .   Dental Screening: Recommended annual  dental exams for proper oral hygiene  Community Resource Referral / Chronic Care Management: CRR required this visit?  No   CCM required this visit?  No      Plan:     I have personally reviewed and noted the following in the patient's chart:   Medical and social history Use of alcohol, tobacco or illicit drugs  Current medications and supplements including opioid prescriptions. Patient is not currently taking opioid prescriptions. Functional ability and status Nutritional status Physical activity Advanced directives List of other physicians Hospitalizations, surgeries, and ER visits in previous 12 months Vitals Screenings to include cognitive, depression, and falls Referrals and appointments  In addition, I have reviewed and discussed with patient certain preventive protocols, quality metrics, and best practice recommendations. A written personalized care plan for preventive services as well as general preventive health recommendations were provided to patient.     Ms. Stigall , Thank you for taking time to come for your Medicare Wellness Visit. I appreciate your ongoing commitment to your health goals. Please review the following plan we discussed and let me know if I can assist you in the future.   These are the goals we discussed:  Goals       CONTACT NUTRITIONIST AT [facility]      To assist in weight loss following gastric bypass      Exercise 3x per week (45 min per time) (pt-stated)      Walking        This is a list of the screening recommended for you and due dates:  Health Maintenance  Topic Date Due   Zoster (Shingles) Vaccine (2 of 2) 05/28/2021   Pneumonia Vaccine (3 of 3 - PPSV23 or PCV20) 06/21/2021   Colon Cancer Screening  04/30/2022   COVID-19 Vaccine (4 - 2023-24 season) 08/03/2022   Flu Shot  07/04/2023   DTaP/Tdap/Td vaccine (4 - Td or Tdap) 10/10/2023   Medicare Annual Wellness Visit  03/18/2024   Mammogram  02/24/2025    DEXA scan (bone density measurement)  Completed   Hepatitis C Screening: USPSTF Recommendation to screen - Ages 77-79 yo.  Completed   HPV Vaccine  Aged [address], [place]   03/19/2023   Nurse Notes: Approximately 30 minute Non-Face -To-Face Medicare Wellness Visit

## 2023-03-19 NOTE — Patient Instructions (Signed)

## 2023-04-23 ENCOUNTER — Ambulatory Visit (INDEPENDENT_AMBULATORY_CARE_PROVIDER_SITE_OTHER): Payer: Medicare HMO | Admitting: Family Medicine

## 2023-04-23 VITALS — BP 118/74 | HR 51 | Ht 64.0 in | Wt 202.6 lb

## 2023-04-23 DIAGNOSIS — Z23 Encounter for immunization: Secondary | ICD-10-CM | POA: Diagnosis not present

## 2023-04-23 DIAGNOSIS — R21 Rash and other nonspecific skin eruption: Secondary | ICD-10-CM

## 2023-04-23 DIAGNOSIS — B379 Candidiasis, unspecified: Secondary | ICD-10-CM | POA: Diagnosis not present

## 2023-04-23 MED ORDER — NYSTATIN 100000 UNIT/GM EX POWD
1.0000 | Freq: Three times a day (TID) | CUTANEOUS | 2 refills | Status: DC
Start: 2023-04-23 — End: 2023-06-03

## 2023-04-23 MED ORDER — NYSTATIN 100000 UNIT/GM EX CREA
1.0000 | TOPICAL_CREAM | Freq: Four times a day (QID) | CUTANEOUS | 0 refills | Status: DC
Start: 2023-04-23 — End: 2023-05-27

## 2023-04-23 NOTE — Progress Notes (Signed)
    SUBJECTIVE:   CHIEF COMPLAINT / HPI:   Patient presents with rash under her belly that has been ongoing for the past 2 months. She has used hydrogen peroxide, it did not completely resolve. Seems to be itching. Denies fever, chills or other symptoms. She has also tried benadryl which only helped with the itching. Denies any new contact exposures including no new foods, perfumes and soaps. Has a small dog but this is not new.   OBJECTIVE:   BP 118/74   Pulse (!) 51   Ht 5\' 4"  (1.626 m)   Wt 202 lb 9 oz (91.9 kg)   SpO2 100%   BMI 34.77 kg/m   General: Patient well-appearing, in no acute distress. Resp: normal work of breathing noted Derm: erythematous rash resembling satellite lesions noted across pannus without drainage and tender on palpation, no edema or associated warmth noted   Psych: mood appropriate   ASSESSMENT/PLAN:   Rash -rash seems most consistent with candida, prescribed both nystatin powder and cream to alternate between. Advised to keep area dry and wear loose clothing to allow optimal and more rapid healing. We discussed how DM can worsen this, does not have a current DM diagnosis and she has not been checked for this recently but has a family history of DM. May benefit from A1c at next visit which was scheduled with PCP. -follow up as appropriate      Reece Leader, DO [facility]

## 2023-04-23 NOTE — Assessment & Plan Note (Signed)
-  rash seems most consistent with candida, prescribed both nystatin powder and cream to alternate between. Advised to keep area dry and wear loose clothing to allow optimal and more rapid healing. We discussed how DM can worsen this, does not have a current DM diagnosis and she has not been checked for this recently but has a family history of DM. May benefit from A1c at next visit which was scheduled with PCP. -follow up as appropriate

## 2023-04-23 NOTE — Patient Instructions (Addendum)
It was great seeing you today!  Today we discussed your rash, this seems to be due to a fungal infection. Please keep the area dry and wear loose clothing while at home. I have prescribed both nystatin cream and powder, please apply this multiple times a day.   Please follow up at your next scheduled appointment on 7/1 at 1:30 pm with Dr. Hyacinth Meeker, if anything arises between now and then, please don't hesitate to contact our office.   Thank you for allowing [place] to be a part of your medical care!  Thank you, Dr. Robyne Peers

## 2023-04-25 ENCOUNTER — Telehealth: Payer: Self-pay

## 2023-04-25 NOTE — Telephone Encounter (Signed)
Prior Auth for patients medication NTSTATIN POWDER approved by [employer] from 04/25/23 to 12/03/23.  CoverMyMeds Key: [facility] PA Case ID #: [id number]

## 2023-04-25 NOTE — Telephone Encounter (Signed)
A Prior Authorization was initiated for this patients NYSTATIN POWDER through CoverMyMeds.   Key: [id number]

## 2023-04-26 ENCOUNTER — Other Ambulatory Visit: Payer: Self-pay | Admitting: Family Medicine

## 2023-04-26 DIAGNOSIS — M25551 Pain in right hip: Secondary | ICD-10-CM

## 2023-05-01 ENCOUNTER — Encounter (HOSPITAL_COMMUNITY): Payer: Self-pay

## 2023-05-01 ENCOUNTER — Other Ambulatory Visit: Payer: Self-pay

## 2023-05-01 ENCOUNTER — Emergency Department (HOSPITAL_COMMUNITY)
Admission: EM | Admit: 2023-05-01 | Discharge: 2023-05-01 | Disposition: A | Discharge status: Home / Self Care | Payer: Medicare HMO | Attending: Emergency Medicine | Admitting: Emergency Medicine

## 2023-05-01 ENCOUNTER — Emergency Department (HOSPITAL_COMMUNITY): Payer: Medicare HMO

## 2023-05-01 DIAGNOSIS — R197 Diarrhea, unspecified: Secondary | ICD-10-CM | POA: Insufficient documentation

## 2023-05-01 DIAGNOSIS — R42 Dizziness and giddiness: Secondary | ICD-10-CM | POA: Diagnosis present

## 2023-05-01 DIAGNOSIS — R531 Weakness: Secondary | ICD-10-CM | POA: Diagnosis not present

## 2023-05-01 DIAGNOSIS — F19939 Other psychoactive substance use, unspecified with withdrawal, unspecified: Secondary | ICD-10-CM

## 2023-05-01 DIAGNOSIS — R059 Cough, unspecified: Secondary | ICD-10-CM | POA: Insufficient documentation

## 2023-05-01 DIAGNOSIS — I10 Essential (primary) hypertension: Secondary | ICD-10-CM | POA: Diagnosis not present

## 2023-05-01 DIAGNOSIS — F19130 Other psychoactive substance abuse with withdrawal, uncomplicated: Secondary | ICD-10-CM | POA: Diagnosis not present

## 2023-05-01 DIAGNOSIS — Z79899 Other long term (current) drug therapy: Secondary | ICD-10-CM | POA: Insufficient documentation

## 2023-05-01 DIAGNOSIS — R350 Frequency of micturition: Secondary | ICD-10-CM | POA: Insufficient documentation

## 2023-05-01 DIAGNOSIS — F1123 Opioid dependence with withdrawal: Secondary | ICD-10-CM | POA: Diagnosis not present

## 2023-05-01 DIAGNOSIS — R109 Unspecified abdominal pain: Secondary | ICD-10-CM | POA: Insufficient documentation

## 2023-05-01 DIAGNOSIS — R0602 Shortness of breath: Secondary | ICD-10-CM | POA: Insufficient documentation

## 2023-05-01 DIAGNOSIS — R000 Tachycardia, unspecified: Secondary | ICD-10-CM | POA: Diagnosis not present

## 2023-05-01 DIAGNOSIS — R202 Paresthesia of skin: Secondary | ICD-10-CM | POA: Diagnosis not present

## 2023-05-01 DIAGNOSIS — Z7901 Long term (current) use of anticoagulants: Secondary | ICD-10-CM | POA: Insufficient documentation

## 2023-05-01 LAB — CBC WITH DIFFERENTIAL/PLATELET
Abs Immature Granulocytes: 0.02 10*3/uL (ref 0.00–0.07)
Basophils Absolute: 0.1 10*3/uL (ref 0.0–0.1)
Basophils Relative: 1 %
Eosinophils Absolute: 0.1 10*3/uL (ref 0.0–0.5)
Eosinophils Relative: 1 %
HCT: 36.5 % (ref 36.0–46.0)
Hemoglobin: 11.5 g/dL — ABNORMAL LOW (ref 12.0–15.0)
Immature Granulocytes: 0 %
Lymphocytes Relative: 25 %
Lymphs Abs: 1.4 10*3/uL (ref 0.7–4.0)
MCH: 26.7 pg (ref 26.0–34.0)
MCHC: 31.5 g/dL (ref 30.0–36.0)
MCV: 84.7 fL (ref 80.0–100.0)
Monocytes Absolute: 0.7 10*3/uL (ref 0.1–1.0)
Monocytes Relative: 13 %
Neutro Abs: 3.3 10*3/uL (ref 1.7–7.7)
Neutrophils Relative %: 60 %
Platelets: 256 10*3/uL (ref 150–400)
RBC: 4.31 MIL/uL (ref 3.87–5.11)
RDW: 14.9 % (ref 11.5–15.5)
WBC: 5.5 10*3/uL (ref 4.0–10.5)
nRBC: 0 % (ref 0.0–0.2)

## 2023-05-01 LAB — I-STAT CHEM 8, ED
BUN: 15 mg/dL (ref 8–23)
Calcium, Ion: 1.29 mmol/L (ref 1.15–1.40)
Chloride: 105 mmol/L (ref 98–111)
Creatinine, Ser: 1 mg/dL (ref 0.44–1.00)
Glucose, Bld: 94 mg/dL (ref 70–99)
HCT: 37 % (ref 36.0–46.0)
Hemoglobin: 12.6 g/dL (ref 12.0–15.0)
Potassium: 3.8 mmol/L (ref 3.5–5.1)
Sodium: 139 mmol/L (ref 135–145)
TCO2: 23 mmol/L (ref 22–32)

## 2023-05-01 LAB — COMPREHENSIVE METABOLIC PANEL
ALT: 12 U/L (ref 0–44)
AST: 23 U/L (ref 15–41)
Albumin: 4 g/dL (ref 3.5–5.0)
Alkaline Phosphatase: 105 U/L (ref 38–126)
Anion gap: 13 (ref 5–15)
BUN: 12 mg/dL (ref 8–23)
CO2: 21 mmol/L — ABNORMAL LOW (ref 22–32)
Calcium: 10.8 mg/dL — ABNORMAL HIGH (ref 8.9–10.3)
Chloride: 103 mmol/L (ref 98–111)
Creatinine, Ser: 1.07 mg/dL — ABNORMAL HIGH (ref 0.44–1.00)
GFR, Estimated: 56 mL/min — ABNORMAL LOW (ref >60)
Glucose, Bld: 96 mg/dL (ref 70–99)
Potassium: 3.7 mmol/L (ref 3.5–5.1)
Sodium: 137 mmol/L (ref 135–145)
Total Bilirubin: 0.8 mg/dL (ref 0.3–1.2)
Total Protein: 7.7 g/dL (ref 6.5–8.1)

## 2023-05-01 LAB — URINALYSIS, ROUTINE W REFLEX MICROSCOPIC
Bilirubin Urine: NEGATIVE
Glucose, UA: NEGATIVE mg/dL
Hgb urine dipstick: NEGATIVE
Ketones, ur: 20 mg/dL — AB
Leukocytes,Ua: NEGATIVE
Nitrite: NEGATIVE
Protein, ur: NEGATIVE mg/dL
Specific Gravity, Urine: 1.017 (ref 1.005–1.030)
pH: 7 (ref 5.0–8.0)

## 2023-05-01 LAB — TROPONIN I (HIGH SENSITIVITY): Troponin I (High Sensitivity): 5 ng/L (ref <18)

## 2023-05-01 LAB — CBG MONITORING, ED: Glucose-Capillary: 99 mg/dL (ref 70–99)

## 2023-05-01 LAB — POC OCCULT BLOOD, ED: Fecal Occult Bld: NEGATIVE

## 2023-05-01 MED ORDER — GABAPENTIN 300 MG PO CAPS
600.0000 mg | ORAL_CAPSULE | Freq: Once | ORAL | Status: AC
  Administered 2023-05-01: 600 mg via ORAL
  Filled 2023-05-01: qty 2

## 2023-05-01 MED ORDER — DIAZEPAM 2 MG PO TABS
2.0000 mg | ORAL_TABLET | Freq: Once | ORAL | Status: AC
  Administered 2023-05-01: 2 mg via ORAL
  Filled 2023-05-01: qty 1

## 2023-05-01 NOTE — ED Provider Notes (Signed)
[place] EMERGENCY DEPARTMENT AT [facility] Provider Note   CSN: [id number] Arrival date & time: 05/01/23  2595     History  Chief Complaint  Patient presents with   Weakness    Christina Hardin is a 72 y.o. female PMH significant for atrial fibrillation, hypertension, RLS and thoracic aortic aneurysm who presents today via EMS for weakness. She states she has been out of gabapentin for 6 days and feels she might be having withdrawals. Additional complaints include dizziness and feeling like she will pass out when she sits or stands up. She has been short of breath recently, worse upon standing/walking. Occasional cough but denies sputum production or hemoptysis. Denies chest pain. She has had a few falls over the last year but most recent was 2 months ago. No recent head injury.   She reports having one large episode of diarrhea yesterday described as black in color. She has had a few smaller episodes since then. Associated symptoms include abdominal pain, nausea and pruritus all over her body. She has been taking Peptobismol as well as ibuprofen daily.   She reports urinary frequency and urgency for the last week. Has not taken her temperature at home but states she has felt hot recently. Denies hematuria or flank pain. She recently was evaluated for candidal infection of groin and given a prescription for nystatin cream.    Weakness      Home Medications Prior to Admission medications   Medication Sig Start Date End Date Taking? Authorizing Provider  amLODipine (NORVASC) 5 MG tablet Take 5 mg by mouth daily. PRN 12/24/22  Yes [provider]  Cyanocobalamin 2500 MCG CHEW Chew 2,500 mcg by mouth daily. Vitamin B12   Yes [provider]  diphenhydramine-acetaminophen (TYLENOL PM) 25-500 MG TABS tablet Take 1 tablet by mouth at bedtime as needed.   Yes [provider]  escitalopram (LEXAPRO) 10 MG tablet Take 10 mg by mouth daily.   Yes [provider]  ibuprofen (ADVIL) 200 MG tablet Take 200 mg by mouth every 6 (six) hours as needed for headache.   Yes [provider]  losartan (COZAAR) 100 MG tablet Take 100 mg by mouth daily. 03/21/23  Yes [provider]  metoprolol succinate (TOPROL-XL) 25 MG 24 hr tablet Take 1 tablet (25 mg total) by mouth daily. 02/14/23  Yes Glendale Chard, DO  nystatin (MYCOSTATIN/NYSTOP) powder Apply 1 Application topically 3 (three) times daily. Patient taking differently: Apply 1 Application topically 3 (three) times daily as needed (rash). 04/23/23  Yes Ganta, Anupa, DO  nystatin cream (MYCOSTATIN) Apply 1 Application topically 4 (four) times daily for 15 days. Apply to rash 4 times daily for 2 weeks. 04/23/23 05/08/23 Yes Ganta, Anupa, DO  rivaroxaban (XARELTO) 20 MG TABS tablet Take 1 tablet (20 mg total) by mouth daily with supper. With a meal 03/07/23  Yes Maury Dus, MD  alendronate (FOSAMAX) 35 MG tablet Take 35 mg by mouth once a week. Patient not taking: Reported on 05/01/2023    [provider]  cyclobenzaprine (FLEXERIL) 10 MG tablet Take 10 mg by mouth 3 (three) times daily as needed for muscle spasms. Patient not taking: Reported on 05/01/2023 12/04/22   [provider]  gabapentin (NEURONTIN) 300 MG capsule TAKE 2 CAPSULES BY MOUTH 3 TIMES DAILY. Patient not taking: Reported on 05/01/2023 04/29/23   Evette Georges, MD  losartan (COZAAR) 50 MG tablet Take 1 tablet (50 mg total) by mouth daily. Patient not taking:  Reported on 05/01/2023 02/14/23   Glendale Chard, DO      Allergies    Dilaudid [hydromorphone hcl] and Propoxyphene hcl    Review of Systems   Review of Systems  Neurological:  Positive for weakness.    Physical Exam Updated Vital Signs BP 130/85   Pulse 67   Temp 97.9 F (36.6 C)   Resp 19   SpO2 96%  Physical Exam Vitals and nursing note reviewed. Exam conducted with a chaperone present.  Constitutional:      General: She is not in acute  distress.    Appearance: She is well-developed. She is not diaphoretic.  HENT:     Head: Normocephalic and atraumatic.     Right Ear: External ear normal.     Left Ear: External ear normal.     Nose: Nose normal.     Mouth/Throat:     Mouth: Mucous membranes are moist.  Eyes:     General: No scleral icterus.    Conjunctiva/sclera: Conjunctivae normal.  Cardiovascular:     Rate and Rhythm: Normal rate and regular rhythm.     Heart sounds: Normal heart sounds. No murmur heard.    No friction rub. No gallop.  Pulmonary:     Effort: Pulmonary effort is normal. No respiratory distress.     Breath sounds: Normal breath sounds.  Abdominal:     General: Bowel sounds are normal. There is no distension.     Palpations: Abdomen is soft. There is no mass.     Tenderness: There is no abdominal tenderness. There is no guarding.  Musculoskeletal:     Cervical back: Normal range of motion.  Skin:    General: Skin is warm and dry.  Neurological:     Mental Status: She is alert and oriented to person, place, and time.     Motor: Tremor present.  Psychiatric:        Mood and Affect: Mood is anxious.        Behavior: Behavior normal.     ED Results / Procedures / Treatments   Labs (all labs ordered are listed, but only abnormal results are displayed) Labs Reviewed  CBC WITH DIFFERENTIAL/PLATELET - Abnormal; Notable for the following components:      Result Value   Hemoglobin 11.5 (*)    All other components within normal limits  COMPREHENSIVE METABOLIC PANEL - Abnormal; Notable for the following components:   CO2 21 (*)    Creatinine, Ser 1.07 (*)    Calcium 10.8 (*)    GFR, Estimated 56 (*)    All other components within normal limits  URINALYSIS, ROUTINE W REFLEX MICROSCOPIC - Abnormal; Notable for the following components:   Ketones, ur 20 (*)    All other components within normal limits  CBG MONITORING, ED  POC OCCULT BLOOD, ED  I-STAT CHEM 8, ED  TROPONIN I (HIGH SENSITIVITY)     EKG EKG Interpretation  Date/Time:  Wednesday May 01 2023 07:11:49 EDT Ventricular Rate:  65 PR Interval:  181 QRS Duration: 83 QT Interval:  448 QTC Calculation: 466 R Axis:   11 Text Interpretation: Sinus rhythm Borderline T abnormalities, anterior leads similar to Oct 2014 Confirmed by Pricilla Loveless [phone]) on 05/01/2023 7:21:16 AM  Radiology DG Chest Port 1 View  Result Date: 05/01/2023 CLINICAL DATA:  Shortness of breath. EXAM: PORTABLE CHEST 1 VIEW COMPARISON:  02/20/2023 FINDINGS: Vague densities in the periphery of the left lung likely represent chronic changes related to previous treatment/radiation.  No focal airspace disease. Heart size is normal. Trachea is midline. Surgical clips in left breast. IMPRESSION: 1. No acute cardiopulmonary disease. 2. Vague densities in the periphery of the left lung are probably related to previous treatment changes. Electronically Signed   By: Richarda Overlie M.D.   On: 05/01/2023 08:24    Procedures Procedures    Medications Ordered in ED Medications  gabapentin (NEURONTIN) capsule 600 mg (600 mg Oral Given 05/01/23 0830)  diazepam (VALIUM) tablet 2 mg (2 mg Oral Given 05/01/23 0830)    ED Course/ Medical Decision Making/ A&P                             Medical Decision Making Amount and/or Complexity of Data Reviewed Labs: ordered. Radiology: ordered. ECG/medicine tests: ordered.  Risk Prescription drug management.   72 year old female who presents emergency department with complaint of near syncope. The differential for syncope is extensive and includes, but is not limited to: arrythmia (Vtach, SVT, SSS, sinus arrest, AV block, bradycardia) aortic stenosis, AMI, HOCM, PE, atrial myxoma, pulmonary hypertension, orthostatic hypotension, (hypovolemia, drug effect, GB syndrome, micturition, cough, swall) carotid sinus sensitivity, Seizure, TIA/CVA, hypoglycemia,  Vertigo.   Sxs began after abrupt discontinuation of  Gabapentin  SDOH Screenings   Food Insecurity: No Food Insecurity (03/19/2023)  Housing: Low Risk  (03/19/2023)  Transportation Needs: No Transportation Needs (03/19/2023)  Utilities: Not At Risk (03/19/2023)  Depression (PHQ2-9): Medium Risk (04/23/2023)  Financial Resource Strain: Low Risk  (03/19/2023)  Physical Activity: Inactive (03/19/2023)  Social Connections: Moderately Integrated (03/19/2023)  Stress: No Stress Concern Present (03/19/2023)  Tobacco Use: Low Risk  (05/01/2023)   . I ordered and reviewed patient's labs  Troponin is negative, remainder of labs are not significant. I ordered and interpreted imaging including 1 view chest x-ray which shows no acute findings I interpreted the EKG which shows sinus rhythm at a rate of 65.  I I ordered gabapentin and Valium and patient has had complete resolution of her symptoms.  She has negative orthostatic vital signs. I consulted with our ED pharmacist, patient appears to have been out of refills.  There is a current 1 month prescription of her gabapentin at her pharmacy.  Suspect this is all secondary to withdrawal symptoms.   Pt has remained hemodynamically stable throughout their time in the ED.   BP 130/85   Pulse 67   Temp 97.9 F (36.6 C)   Resp 19   SpO2 96%   Patient understands return precautions and follow up care. All questions answered.            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

## 2023-05-01 NOTE — Discharge Instructions (Signed)
Get help right away if you have any of the following symptoms Allergic reactions or angioedema--skin rash, itching, hives, swelling of the face, eyes, lips, tongue, arms, or legs, trouble swallowing or breathing Rash, fever, and swollen lymph nodes Thoughts of suicide or self harm, worsening mood, feelings of depression Trouble breathing Unusual changes in mood or behavior in children after use such as difficulty concentrating, hostility, or restlessness

## 2023-05-01 NOTE — ED Triage Notes (Signed)
Pt BIB [facility] from home d/t weakness felt all over since she has been out of her Gabapentin. Denies pain just endorses worsening numbness & tingling in extremities. A/Ox4, 168/70, was in A-fib does endorse that at baseline, 24 resp, 100 cbg.

## 2023-05-03 ENCOUNTER — Telehealth: Payer: Self-pay

## 2023-05-03 NOTE — Transitions of Care (Post Inpatient/ED Visit) (Signed)
05/03/2023  Name: Christina Hardin MRN: [id number] DOB: Feb 07, 1951  Today's TOC FU Call Status: Today's TOC FU Call Status:: Successful TOC FU Call Competed TOC FU Call Complete Date: 05/03/23  Transition Care Management Follow-up Telephone Call Date of Discharge: 05/01/23 Discharge Facility: [facility]) Type of Discharge: Emergency Department Reason for ED Visit: Other: How have you been since you were released from the hospital?: Same ("I still feel lightheaded") Any questions or concerns?: No  Items Reviewed: Did you receive and understand the discharge instructions provided?: Yes Medications obtained,verified, and reconciled?: Yes (Medications Reviewed) Any new allergies since your discharge?: No Dietary orders reviewed?: No Do you have support at home?: Yes People in Home: child(ren), adult Name of Support/Comfort Primary Source: Minerva Areola  Medications Reviewed Today: Medications Reviewed Today     Reviewed by Jodelle Gross, RN (Case Manager) on 05/03/23 at 1412  Med List Status: <None>   Medication Order Taking? Sig Documenting Provider Last Dose Status Informant  alendronate (FOSAMAX) 35 MG tablet [id number] No Take 35 mg by mouth once a week.  Patient not taking: Reported on 05/01/2023   [provider] Not Taking Active Self, Family Member, Pharmacy Records  amLODipine (NORVASC) 5 MG tablet [id number] Yes Take 5 mg by mouth daily. PRN [provider] Taking Active Self, Family Member, Pharmacy Records  Cyanocobalamin 2500 MCG CHEW [id number] Yes Chew 2,500 mcg by mouth daily. Vitamin B12 [provider] Taking Active Self, Family Member, Pharmacy Records  cyclobenzaprine (FLEXERIL) 10 MG tablet [id number] No Take 10 mg by mouth 3 (three) times daily as needed for muscle spasms.  Patient not taking: Reported on 05/01/2023   [provider] Not Taking Active Self, Family Member, Pharmacy Records  diphenhydramine-acetaminophen (TYLENOL PM)  25-500 MG TABS tablet [id number] No Take 1 tablet by mouth at bedtime as needed. [provider] Unknown Active Self, Family Member, Pharmacy Records  escitalopram (LEXAPRO) 10 MG tablet [id number] Yes Take 10 mg by mouth daily. [provider] Taking Active Self, Family Member, Pharmacy Records  gabapentin (NEURONTIN) 300 MG capsule [id number] Yes TAKE 2 CAPSULES BY MOUTH 3 TIMES DAILY. Evette Georges, MD Taking Active Self, Family Member, Pharmacy Records  ibuprofen (ADVIL) 200 MG tablet [id number] Yes Take 200 mg by mouth every 6 (six) hours as needed for headache. [provider] Taking Active Self, Family Member, Pharmacy Records  losartan (COZAAR) 100 MG tablet [id number] Yes Take 100 mg by mouth daily. [provider] Taking Active Self, Family Member, Pharmacy Records  losartan (COZAAR) 50 MG tablet [id number] No Take 1 tablet (50 mg total) by mouth daily.  Patient not taking: Reported on 05/01/2023   Glendale Chard, DO Not Taking Active Self, Family Member, Pharmacy Records  metoprolol succinate (TOPROL-XL) 25 MG 24 hr tablet [id number] Yes Take 1 tablet (25 mg total) by mouth daily. Glendale Chard, DO Taking Active Self, Family Member, Pharmacy Records  nystatin (MYCOSTATIN/NYSTOP) powder [id number] Yes Apply 1 Application topically 3 (three) times daily.  Patient taking differently: Apply 1 Application topically 3 (three) times daily as needed (rash).   Reece Leader, DO Taking Active Self, Family Member, Pharmacy Records  nystatin cream (MYCOSTATIN) [id number] Yes Apply 1 Application topically 4 (four) times daily for 15 days. Apply to rash 4 times daily for 2 weeks. Reece Leader, DO Taking Active Self, Family Member, Pharmacy Records  rivaroxaban (XARELTO) 20 MG TABS tablet [id number] Yes Take 1 tablet (20 mg total) by mouth daily with supper. With a meal Hardin, Ashleigh,  MD Taking Active Self, Family Member, Pharmacy Records            Home Care and  Equipment/Supplies: Were Home Health Services Ordered?: No Any new equipment or medical supplies ordered?: No  Functional Questionnaire: Do you need assistance with bathing/showering or dressing?: No Do you need assistance with meal preparation?: No Do you need assistance with eating?: No Do you have difficulty maintaining continence: No Do you need assistance with getting out of bed/getting out of a chair/moving?: No Do you have difficulty managing or taking your medications?: No  Follow up appointments reviewed: PCP Follow-up appointment confirmed?: Yes Date of PCP follow-up appointment?: 05/06/23 Follow-up Provider: [facility] Follow-up appointment confirmed?: NA Do you need transportation to your follow-up appointment?: No Do you understand care options if your condition(s) worsen?: Yes-patient verbalized understanding  SDOH Interventions Today    Flowsheet Row Most Recent Value  SDOH Interventions   Food Insecurity Interventions Intervention Not Indicated  Housing Interventions Intervention Not Indicated  Transportation Interventions Intervention Not Indicated  Utilities Interventions Intervention Not Indicated  Financial Strain Interventions Intervention Not Indicated      Interventions Today    Flowsheet Row Most Recent Value  Chronic Disease   Chronic disease during today's visit Other  [lightheaded, weakness]  General Interventions   General Interventions Discussed/Reviewed General Interventions Discussed, Doctor Visits  Doctor Visits Discussed/Reviewed Doctor Visits Discussed       TOC Interventions Today    Flowsheet Row Most Recent Value  TOC Interventions   TOC Interventions Discussed/Reviewed TOC Interventions Reviewed, Arranged PCP follow up within 7 days/Care Guide scheduled      Jodelle Gross, RN, BSN, CCM Care Management Coordinator [place] Phone: [phone]/Fax: [phone]

## 2023-05-06 ENCOUNTER — Ambulatory Visit (INDEPENDENT_AMBULATORY_CARE_PROVIDER_SITE_OTHER): Payer: Medicare HMO | Admitting: Student

## 2023-05-06 ENCOUNTER — Encounter: Payer: Self-pay | Admitting: Student

## 2023-05-06 VITALS — BP 112/80 | HR 76 | Ht 64.0 in | Wt 196.6 lb

## 2023-05-06 DIAGNOSIS — I712 Thoracic aortic aneurysm, without rupture, unspecified: Secondary | ICD-10-CM | POA: Diagnosis not present

## 2023-05-06 DIAGNOSIS — M810 Age-related osteoporosis without current pathological fracture: Secondary | ICD-10-CM | POA: Diagnosis not present

## 2023-05-06 DIAGNOSIS — M25551 Pain in right hip: Secondary | ICD-10-CM

## 2023-05-06 MED ORDER — GABAPENTIN 300 MG PO CAPS: ORAL_CAPSULE | ORAL | 3 refills | Status: DC

## 2023-05-06 NOTE — Progress Notes (Signed)
  SUBJECTIVE:   CHIEF COMPLAINT / HPI:   Christina Hardin is a pleasant 72 year old female here for ED follow-up.  On 5/29, she arrived to the ED after being out of her gabapentin for 6 days and was having withdrawals.    She is feeling well and back to baseline after resuming her medication.  No other concerns.  She would like a DEXA scan ordered today as her sister has "weak bones"   PERTINENT  PMH / PSH:   Past Medical History:  Diagnosis Date   A-fib (HCC)    Anxiety    Breast cancer (HCC) 09/05/2016   left breast dcis   Carpal tunnel syndrome    Ductal carcinoma in situ (DCIS) of left breast    Family history of breast cancer    Gastric bypass status for obesity    GERD (gastroesophageal reflux disease)    Hypertension    Personal history of chemotherapy 2017   Rheumatoid arthritis (HCC)    Thoracic aortic aneurysm (HCC)    Thoracic aortic aneurysm [facility])     Patient Care Team: Glendale Chard, DO as PCP - General (Family Medicine) Rennis Golden, Lisette Abu, MD as PCP - Cardiology (Cardiology) Serena Croissant, MD as Consulting Physician (Hematology and Oncology) Glenna Fellows, MD as Consulting Physician (Plastic Surgery) Dorothy Puffer, MD as Consulting Physician (Radiation Oncology) Axel Filler, Larna Daughters, NP as Nurse Practitioner (Hematology and Oncology) Manus Rudd, MD as Consulting Physician (General Surgery) Rennis Golden Lisette Abu, MD as Consulting Physician (Cardiology) Glyn Ade, PA-C as Physician Assistant (Dermatology) OBJECTIVE:  BP 112/80   Pulse 76   Ht 5\' 4"  (1.626 m)   Wt 196 lb 9.6 oz (89.2 kg)   SpO2 97%   BMI 33.75 kg/m  Physical Exam Constitutional:      Appearance: Normal appearance.  Eyes:     Extraocular Movements: Extraocular movements intact.     Conjunctiva/sclera: Conjunctivae normal.     Pupils: Pupils are equal, round, and reactive to light.  Cardiovascular:     Rate and Rhythm: Normal rate and regular rhythm.  Pulmonary:     Effort: Pulmonary  effort is normal.     Breath sounds: Normal breath sounds.  Neurological:     Mental Status: She is alert.      ASSESSMENT/PLAN:  Osteoporosis, unspecified osteoporosis type, unspecified pathological fracture presence -     DG Bone Density; Future  Right hip pain -     Gabapentin; TAKE 2 CAPSULES BY MOUTH 3 TIMES DAILY.  Dispense: 180 capsule; Refill: 3  Thoracic aortic aneurysm without rupture, unspecified part [facility]) Assessment & Plan: Due for screening.  Discussed following up with PCP for this.   Patient had questions about GLP-1 at the end of her visit.  I discussed that she can have follow-up with her PCP for A1c at their appointment July 1.  Did discuss that if she is not a diabetic, has unlikely that insurance will cover this medication.  And other weight loss options can be discussed at that time. No follow-ups on file. Darral Dash, DO 05/06/2023, 4:41 PM PGY-2, [facility]

## 2023-05-06 NOTE — Assessment & Plan Note (Signed)
Due for screening.  Discussed following up with PCP for this.

## 2023-05-06 NOTE — Patient Instructions (Addendum)
It was great to see you! Thank you for allowing me to participate in your care!  I recommend that you always bring your medications to each appointment as this makes it easy to ensure we are on the correct medications and helps [place] not miss when refills are needed.  Our plans for today:  - Take Gabapentin as prescribed, I sent in refills to your pharmacy. - Follow up with your primary doctor, Dr. Hyacinth Meeker, on July 1st to discuss your other concerns and screening tests  Take care and seek immediate care sooner if you develop any concerns.   Dr. Darral Dash, DO [facility]

## 2023-05-06 NOTE — Assessment & Plan Note (Signed)
>>  ASSESSMENT AND PLAN FOR THORACIC AORTIC ANEURYSM WITHOUT RUPTURE (HCC) WRITTEN ON 05/06/2023  4:40 PM BY DAMERON, MARISA, DO  Due for screening.  Discussed following up with PCP for this.

## 2023-05-27 ENCOUNTER — Other Ambulatory Visit: Payer: Self-pay | Admitting: Family Medicine

## 2023-05-27 DIAGNOSIS — B379 Candidiasis, unspecified: Secondary | ICD-10-CM

## 2023-06-03 ENCOUNTER — Encounter: Payer: Self-pay | Admitting: Student

## 2023-06-03 ENCOUNTER — Ambulatory Visit: Payer: Medicare HMO | Admitting: Student

## 2023-06-03 VITALS — BP 129/87 | HR 58 | Wt 196.4 lb

## 2023-06-03 DIAGNOSIS — I7121 Aneurysm of the ascending aorta, without rupture: Secondary | ICD-10-CM | POA: Diagnosis not present

## 2023-06-03 DIAGNOSIS — Z131 Encounter for screening for diabetes mellitus: Secondary | ICD-10-CM | POA: Diagnosis not present

## 2023-06-03 DIAGNOSIS — B379 Candidiasis, unspecified: Secondary | ICD-10-CM | POA: Diagnosis not present

## 2023-06-03 DIAGNOSIS — R21 Rash and other nonspecific skin eruption: Secondary | ICD-10-CM | POA: Diagnosis not present

## 2023-06-03 DIAGNOSIS — M25551 Pain in right hip: Secondary | ICD-10-CM | POA: Diagnosis not present

## 2023-06-03 LAB — POCT GLYCOSYLATED HEMOGLOBIN (HGB A1C): Hemoglobin A1C: 5.4 % (ref 4.0–5.6)

## 2023-06-03 MED ORDER — GABAPENTIN 300 MG PO CAPS: ORAL_CAPSULE | ORAL | 3 refills | Status: DC

## 2023-06-03 MED ORDER — RIVAROXABAN 20 MG PO TABS: 20.0000 mg | ORAL_TABLET | Freq: Every day | ORAL | 3 refills | Status: DC

## 2023-06-03 NOTE — Progress Notes (Signed)
    SUBJECTIVE:   CHIEF COMPLAINT / HPI:   Christina Hardin is a 72 y.o. female  presenting for screening for diabetes and discussion of weight loss and starting GLP-1.   Weight loss: Patient interested in pursuing weight loss to reduce recurrence of her yeast infection and skin folds.  She would like to be screened for diabetes today to see if she will qualify for GLP-1.   Yeast infection: Chronic in nature located in pannus and bilateral inguinal areas.  She has used nystatin cream with good relief.  She reports she had complete resolution with the cream and is just started to have return of symptoms.  She reports the cream is working well and she is not desiring anything other for treatment.  Thoracic aortic aneursynm: Followed by CT surgery in [place].  Last seen on 11/2022.  With recommended CT follow-up in 1 year.  Documented aneurysm at 4.5 cm on CT chest from 11/2022.  Patient will need to establish care within [facility] area   PERTINENT  PMH / PSH: Reviewed and updated   OBJECTIVE:   BP 129/87 (BP Location: Right Arm, Patient Position: Sitting, Cuff Size: Large)   Pulse (!) 58   Wt 196 lb 6.4 oz (89.1 kg)   SpO2 100%   BMI 33.71 kg/m   Well-appearing, no acute distress Cardio: Regular rate, regular rhythm, no murmurs on exam. Pulm: Clear, no wheezing, no crackles. No increased work of breathing Abdominal: bowel sounds present, soft, non-tender, non-distended Extremities: no peripheral edema  Neuro: alert and oriented x3, speech normal in content, no facial asymmetry, strength intact and equal bilaterally in UE and LE, pupils equal and reactive to light.  Psych:  Cognition and judgment appear intact. Alert, communicative  and cooperative with normal attention span and concentration. No apparent delusions, illusions, hallucinations Skin: Mild erythematous lesion under pannus and right inguinal area consistent with mild yeast infection     06/03/2023    1:38 PM 05/06/2023    3:35  PM 04/23/2023    2:07 PM  PHQ9 SCORE ONLY  PHQ-9 Total Score 5 6 5     Lab Results  Component Value Date   HGBA1C 5.4 06/03/2023      ASSESSMENT/PLAN:   Thoracic aortic aneurysm [facility]) Patient next due for CT chest 11/2023.  Will send in referral for cardiothoracic surgery to begin monitoring  Rash Consistent with yeast infection.  Instructed patient to continue nystatin cream.  If rash begins to worsen she has a severe reaction will call in one-time dose of fluconazole oral.   Diabetes screening: Patient's A1c is well below prediabetic or diabetic threshold.  Does not qualify for GLP-1 based off this criteria.  Can consider additional pursuit with cardiac risk.  Continue conversation at next appointment.  Discussed seeing nutritionist within [facility] or seeing [facility] for evaluation for weight loss  Glendale Chard, DO [facility]

## 2023-06-03 NOTE — Assessment & Plan Note (Signed)
Consistent with yeast infection.  Instructed patient to continue nystatin cream.  If rash begins to worsen she has a severe reaction will call in one-time dose of fluconazole oral.

## 2023-06-03 NOTE — Assessment & Plan Note (Signed)
Patient next due for CT chest 11/2023.  Will send in referral for cardiothoracic surgery to begin monitoring

## 2023-06-03 NOTE — Patient Instructions (Signed)
It was great to see you today!   Today we addressed: Medications - refills sent  Referral to cardiothoracic surgery sent to monitor your aneurysm  If you start to develop a severe rash call the office and I will send you in a prescription for a pill.  Check out [facility] online for weight loss, you can also call our clinic to schedule an appointment with our nutritionist.   Future Appointments  Date Time Provider Department Center  03/23/2024 12:30 PM [facility] ANNUAL WELLNESS VISIT [facility]    Please arrive 15 minutes before your appointment to ensure smooth check in process.    Please call the clinic at [phone] if your symptoms worsen or you have any concerns.  Thank you for allowing me to participate in your care, Dr. Glendale Chard [facility]

## 2023-06-20 ENCOUNTER — Other Ambulatory Visit: Payer: Self-pay

## 2023-06-20 DIAGNOSIS — I7121 Aneurysm of the ascending aorta, without rupture: Secondary | ICD-10-CM

## 2023-08-20 ENCOUNTER — Telehealth: Payer: Self-pay

## 2023-08-21 NOTE — Telephone Encounter (Signed)
PCP returned message. Aquilla Solian, CMA

## 2023-09-03 ENCOUNTER — Encounter: Payer: Self-pay | Admitting: Surgery

## 2023-09-04 ENCOUNTER — Ambulatory Visit (INDEPENDENT_AMBULATORY_CARE_PROVIDER_SITE_OTHER): Payer: Medicare HMO | Admitting: Family Medicine

## 2023-09-04 ENCOUNTER — Ambulatory Visit
Admission: RE | Admit: 2023-09-04 | Discharge: 2023-09-04 | Disposition: A | Discharge status: Home / Self Care | Payer: Medicare HMO | Source: Ambulatory Visit | Attending: Family Medicine | Admitting: Family Medicine

## 2023-09-04 ENCOUNTER — Encounter: Payer: Self-pay | Admitting: Family Medicine

## 2023-09-04 VITALS — BP 146/89 | HR 68 | Ht 64.0 in | Wt 196.0 lb

## 2023-09-04 DIAGNOSIS — G8929 Other chronic pain: Secondary | ICD-10-CM

## 2023-09-04 DIAGNOSIS — M25551 Pain in right hip: Secondary | ICD-10-CM | POA: Insufficient documentation

## 2023-09-04 DIAGNOSIS — M25552 Pain in left hip: Secondary | ICD-10-CM | POA: Diagnosis not present

## 2023-09-04 DIAGNOSIS — M545 Low back pain, unspecified: Secondary | ICD-10-CM | POA: Diagnosis not present

## 2023-09-04 MED ORDER — DICLOFENAC SODIUM 1 % EX GEL: 2.0000 g | Freq: Four times a day (QID) | CUTANEOUS | 0 refills | Status: DC

## 2023-09-04 NOTE — Patient Instructions (Addendum)
It was great to see you today! Thank you for choosing [facility] for your primary care. Christina Hardin was seen for back and hip pain.  Today we addressed: Back and hip pain - please get Xrays of your back and hips. I have ordered these for you at [facility] on [facility] Apply voltaren gel to locations of the pain Follow up with [place] after you get your Xrays in around 2 weeks.  If you haven't already, sign up for My Chart to have easy access to your labs results, and communication with your primary care physician.  Call the clinic at [phone] if your symptoms worsen or you have any concerns.   Thank you for allowing me to participate in your care, Para March, DO 09/04/2023, 10:33 AM PGY-1, [facility]

## 2023-09-04 NOTE — Assessment & Plan Note (Addendum)
Bilateral hip pain x 6 months.  Unable to stand for prolonged periods of time, pick up her dog, or stand to cook without pain.  Suspect this is related to osteoarthritis - Ordered bilateral hip x-rays - Voltaren gel as needed for pain - Will follow-up with PCP after x-rays

## 2023-09-04 NOTE — Progress Notes (Signed)
    SUBJECTIVE:   CHIEF COMPLAINT / HPI:   Bilateral hip pain and back pain Patient presents with bilateral hip and lower back pain x 6 months.  Notes that she typically does not have pain at rest. It is mainly when she stands for prolonged periods of time, stands in the grocery store, stands to cook, or bends over to pick up her dog.  States that Tylenol temporarily helps her pain. No recent injury.  No falls.  Takes alendronate for osteoporosis.  Patient handles all of her medications independently and her son lives with her. Patient denies bowel/bladder incontinence and numbness or tingling in her legs.  PERTINENT  PMH / PSH: Hypertension, A-fib, CAD, osteoporosis  OBJECTIVE:   BP (!) 146/89   Pulse 68   Ht 5\' 4"  (1.626 m)   Wt 196 lb (88.9 kg)   SpO2 98%   BMI 33.64 kg/m   MSK: Ambulating independently without difficulty.  Full range of motion of bilateral hips.  No tenderness to palpation over bilateral hips.  No swelling or warmth noted to bilateral hips. Back: No C, T, L-spine tenderness to palpation.  No paraspinal tenderness to palpation.  Able to stand from sitting position.  ASSESSMENT/PLAN:   Bilateral hip pain Bilateral hip pain x 6 months.  Unable to stand for prolonged periods of time, pick up her dog, or stand to cook without pain.  Suspect this is related to osteoarthritis - Ordered bilateral hip x-rays - Voltaren gel as needed for pain - Will follow-up with PCP after x-rays  Chronic bilateral low back pain without sciatica Chronic lower back pain x 6 months.  Unable to stand for prolonged periods of time, states pain is worse with sitting up.  No neurological symptoms.  Suspect this is related to osteoarthritis vs spinal stenosis vs compression fractures -Ordered lumbar spine x-ray - Voltaren gel as needed - Follow-up with PCP after x-ray obtained   Para March, DO [facility]

## 2023-09-04 NOTE — Assessment & Plan Note (Addendum)
Chronic lower back pain x 6 months.  Unable to stand for prolonged periods of time, states pain is worse with sitting up.  No neurological symptoms.  Suspect this is related to osteoarthritis vs spinal stenosis vs compression fractures -Ordered lumbar spine x-ray - Voltaren gel as needed - Follow-up with PCP after x-ray obtained

## 2023-09-04 NOTE — Assessment & Plan Note (Signed)
>>  ASSESSMENT AND PLAN FOR CHRONIC BILATERAL LOW BACK PAIN WITHOUT SCIATICA WRITTEN ON 09/04/2023 11:34 AM BY EVERHART, KIRSTIE, DO  Chronic lower back pain x 6 months.  Unable to stand for prolonged periods of time, states pain is worse with sitting up.  No neurological symptoms.  Suspect this is related to osteoarthritis vs spinal stenosis vs compression fractures -Ordered lumbar spine x-ray - Voltaren gel as needed - Follow-up with PCP after x-ray obtained

## 2023-09-16 ENCOUNTER — Other Ambulatory Visit: Payer: Medicare HMO

## 2023-09-17 ENCOUNTER — Other Ambulatory Visit: Payer: Self-pay | Admitting: Surgery

## 2023-09-17 ENCOUNTER — Other Ambulatory Visit: Payer: Self-pay | Admitting: Student

## 2023-09-17 DIAGNOSIS — I7121 Aneurysm of the ascending aorta, without rupture: Secondary | ICD-10-CM

## 2023-09-17 DIAGNOSIS — B379 Candidiasis, unspecified: Secondary | ICD-10-CM

## 2023-09-18 ENCOUNTER — Ambulatory Visit
Admission: RE | Admit: 2023-09-18 | Discharge: 2023-09-18 | Disposition: A | Discharge status: Home / Self Care | Payer: Medicare HMO | Source: Ambulatory Visit | Attending: Surgery | Admitting: Surgery

## 2023-09-18 ENCOUNTER — Ambulatory Visit: Payer: Medicare HMO | Admitting: Surgery

## 2023-09-18 DIAGNOSIS — I7121 Aneurysm of the ascending aorta, without rupture: Secondary | ICD-10-CM

## 2023-09-18 MED ORDER — IOPAMIDOL (ISOVUE-370) INJECTION 76%
200.0000 mL | Freq: Once | INTRAVENOUS | Status: AC | PRN
  Administered 2023-09-18: 75 mL via INTRAVENOUS

## 2023-09-23 ENCOUNTER — Other Ambulatory Visit: Payer: Self-pay | Admitting: Family Medicine

## 2023-09-23 DIAGNOSIS — M545 Low back pain, unspecified: Secondary | ICD-10-CM

## 2023-09-23 NOTE — Progress Notes (Signed)
Patient called.  Patient aware.  Placed referral to physical therapy.  Advised to follow-up with PCP if pain persists.

## 2023-09-25 ENCOUNTER — Encounter: Payer: Self-pay | Admitting: Surgery

## 2023-09-25 ENCOUNTER — Ambulatory Visit: Payer: Medicare HMO | Admitting: Surgery

## 2023-09-25 VITALS — BP 119/81 | HR 72 | Resp 18 | Ht 64.0 in | Wt 196.0 lb

## 2023-09-25 DIAGNOSIS — I7121 Aneurysm of the ascending aorta, without rupture: Secondary | ICD-10-CM

## 2023-09-25 NOTE — Progress Notes (Signed)
HPI:  The patient is a 72 year old woman who returns for follow-up of a 4.2 cm fusiform ascending aortic aneurysm.  I last saw her in June 2020.  This aneurysm was initially measured at 4.5 cm in 2017.  Since I last saw her she has continued to feel well overall.  Chronic complaint is of some chronic back pain.  She is supposed to start physical therapy for that.  Current Outpatient Medications  Medication Sig Dispense Refill   alendronate (FOSAMAX) 35 MG tablet Take 35 mg by mouth once a week.     Cyanocobalamin 2500 MCG CHEW Chew 2,500 mcg by mouth daily. Vitamin B12     diclofenac Sodium (VOLTAREN) 1 % GEL Apply 2 g topically 4 (four) times daily. 100 g 0   diphenhydramine-acetaminophen (TYLENOL PM) 25-500 MG TABS tablet Take 1 tablet by mouth at bedtime as needed.     escitalopram (LEXAPRO) 10 MG tablet Take 10 mg by mouth daily.     gabapentin (NEURONTIN) 300 MG capsule TAKE 2 CAPSULES BY MOUTH 3 TIMES DAILY. 360 capsule 3   losartan (COZAAR) 100 MG tablet Take 100 mg by mouth daily.     metoprolol succinate (TOPROL-XL) 25 MG 24 hr tablet Take 1 tablet (25 mg total) by mouth daily. 90 tablet 3   nystatin cream (MYCOSTATIN) APPLY TOPICALLY 4 (FOUR) TIMES DAILY TO RASH FOR 14 TO 15 DAYS 60 g 0   rivaroxaban (XARELTO) 20 MG TABS tablet Take 1 tablet (20 mg total) by mouth daily with supper. With a meal 90 tablet 3   No current facility-administered medications for this visit.     Physical Exam: BP 119/81 (BP Location: Right Arm)   Pulse 72   Resp 18   Ht 5\' 4"  (1.626 m)   Wt 196 lb (88.9 kg)   SpO2 96% Comment: RA  BMI 33.64 kg/m  She looks well. Cardiac exam shows a regular rate and rhythm with normal heart sounds.  There is no murmur. Lungs are clear.  Diagnostic Tests:  Narrative & Impression  CLINICAL DATA:  Thoracic aortic aneurysm.   EXAM: CT ANGIOGRAPHY CHEST WITH CONTRAST   TECHNIQUE: Multidetector CT imaging of the chest was performed using the standard  protocol during bolus administration of intravenous contrast. Multiplanar CT image reconstructions and MIPs were obtained to evaluate the vascular anatomy.   RADIATION DOSE REDUCTION: This exam was performed according to the departmental dose-optimization program which includes automated exposure control, adjustment of the mA and/or kV according to patient size and/or use of iterative reconstruction technique.   CONTRAST:  75mL ISOVUE-370 IOPAMIDOL (ISOVUE-370) INJECTION 76%   COMPARISON:  June 19, 2019.   FINDINGS: Cardiovascular: Grossly stable 4.2 cm ascending thoracic aortic aneurysm. No dissection is noted. Great vessels are widely patent without stenosis. Normal cardiac size. No pericardial effusion.   Mediastinum/Nodes: Stable 17 mm nodule seen in right paraesophageal region most likely related to thyroid gland. Thyroid gland, trachea, and esophagus demonstrate no significant findings.   Lungs/Pleura: No pneumothorax or pleural effusion is noted. Stable probable left lingular scarring is noted. No acute abnormality is noted.   Upper Abdomen: No acute abnormality.   Musculoskeletal: No chest wall abnormality. No acute or significant osseous findings.   Review of the MIP images confirms the above findings.   IMPRESSION: Grossly stable 4.2 cm ascending thoracic aortic aneurysm. Recommend annual imaging followup by CTA or MRA. This recommendation follows 2010 ACCF/AHA/AATS/ACR/ASA/SCA/SCAI/SIR/STS/SVM Guidelines for the Diagnosis and Management of Patients with Thoracic  Aortic Disease. Circulation. 2010; 121: B147-W295. Aortic aneurysm NOS (ICD10-I71.9).     Electronically Signed   By: Lupita Raider M.D.   On: 09/18/2023 14:08      Impression:  She has a stable 4.2 cm fusiform ascending aortic aneurysm.  This is well below the surgical threshold of 5.5 cm.  I reviewed the CTA images with her and answered her questions.  I stressed the importance of continued  good blood pressure control in preventing further enlargement and acute aortic dissection.  I cautioned her against doing any heavy lifting or strenuous activity that may require a Valsalva maneuver and could suddenly raise her blood pressure to high levels.  Plan:  I will see her back in 2 years with a CTA of the chest for aortic surveillance.  I spent 15 minutes performing this established patient evaluation and > 50% of this time was spent face to face counseling and coordinating the care of this patient's aortic aneurysm.    Alleen Borne, MD [facility] [phone]

## 2023-09-27 ENCOUNTER — Other Ambulatory Visit: Payer: Self-pay

## 2023-09-28 MED ORDER — LOSARTAN POTASSIUM 100 MG PO TABS: 100.0000 mg | ORAL_TABLET | Freq: Every day | ORAL | 3 refills | Status: DC

## 2023-10-03 ENCOUNTER — Ambulatory Visit: Payer: Medicare HMO | Admitting: Physical Therapy

## 2023-10-10 ENCOUNTER — Ambulatory Visit: Payer: Medicare HMO | Admitting: Rehabilitative and Restorative Service Providers"

## 2024-01-01 ENCOUNTER — Other Ambulatory Visit: Payer: Self-pay

## 2024-01-01 DIAGNOSIS — I10 Essential (primary) hypertension: Secondary | ICD-10-CM

## 2024-01-01 DIAGNOSIS — I480 Paroxysmal atrial fibrillation: Secondary | ICD-10-CM

## 2024-01-01 MED ORDER — METOPROLOL SUCCINATE ER 25 MG PO TB24: 25.0000 mg | ORAL_TABLET | Freq: Every day | ORAL | 3 refills | Status: DC

## 2024-01-01 MED ORDER — LOSARTAN POTASSIUM 100 MG PO TABS: 100.0000 mg | ORAL_TABLET | Freq: Every day | ORAL | 3 refills | Status: DC

## 2024-01-01 NOTE — Telephone Encounter (Signed)
Patient calls nurse line requesting refills on losartan and metoprolol.   She states that she has new insurance now and needs prescriptions to be sent to [facility] on [address]. Called and canceled at [facility].   Pended refills to this encounter.   Patient is also requesting to schedule follow up visit with Dr. Hyacinth Meeker to discuss fatigue, back pain and intermittent dizzy episodes. These have been occurring for the last two months.   I have scheduled her with PCP for next available appointment on 01/10/24.  Veronda Prude, RN

## 2024-01-06 ENCOUNTER — Telehealth: Payer: Self-pay

## 2024-01-06 DIAGNOSIS — R300 Dysuria: Secondary | ICD-10-CM

## 2024-01-06 MED ORDER — NITROFURANTOIN MONOHYD MACRO 100 MG PO CAPS
100.0000 mg | ORAL_CAPSULE | Freq: Two times a day (BID) | ORAL | 0 refills | Status: AC
Start: 2024-01-06 — End: 2024-01-11

## 2024-01-06 NOTE — Telephone Encounter (Signed)
Patient calls nurse line reporting UTI symptoms.   She reports symptoms stared ~ 2 days ago with dysuria and urinary frequency.   She denies any fevers, abdominal pain, flank pain, abnormal odors or appearance.   She reports she has been taking OTC Azo with some relief. However, she reports she was "up most of the night" with symptoms.   She reports she has a scheduled apt with PCP on 2/7. She is requesting an antibiotic prior to apt so she doesn't have to come in twice.   Advised will forward to PCP.   Precautions discussed for sooner evaluation.

## 2024-01-06 NOTE — Telephone Encounter (Signed)
Called patient and provided with update. Patient appreciative.   Talbot Grumbling, RN

## 2024-01-10 ENCOUNTER — Ambulatory Visit (INDEPENDENT_AMBULATORY_CARE_PROVIDER_SITE_OTHER): Payer: Medicare Other | Admitting: Student

## 2024-01-10 ENCOUNTER — Ambulatory Visit: Payer: Medicare HMO | Admitting: Student

## 2024-01-10 VITALS — BP 103/84 | HR 67 | Ht 64.0 in | Wt 194.6 lb

## 2024-01-10 DIAGNOSIS — R300 Dysuria: Secondary | ICD-10-CM | POA: Diagnosis not present

## 2024-01-10 DIAGNOSIS — I2510 Atherosclerotic heart disease of native coronary artery without angina pectoris: Secondary | ICD-10-CM

## 2024-01-10 DIAGNOSIS — R42 Dizziness and giddiness: Secondary | ICD-10-CM | POA: Diagnosis not present

## 2024-01-10 DIAGNOSIS — R4189 Other symptoms and signs involving cognitive functions and awareness: Secondary | ICD-10-CM

## 2024-01-10 DIAGNOSIS — R4789 Other speech disturbances: Secondary | ICD-10-CM

## 2024-01-10 DIAGNOSIS — R296 Repeated falls: Secondary | ICD-10-CM | POA: Diagnosis not present

## 2024-01-10 LAB — POCT URINALYSIS DIP (MANUAL ENTRY)
Bilirubin, UA: NEGATIVE
Blood, UA: NEGATIVE
Glucose, UA: NEGATIVE mg/dL
Ketones, POC UA: NEGATIVE mg/dL
Nitrite, UA: NEGATIVE
Protein Ur, POC: NEGATIVE mg/dL
Spec Grav, UA: 1.02 (ref 1.010–1.025)
Urobilinogen, UA: 0.2 U/dL
pH, UA: 5.5 (ref 5.0–8.0)

## 2024-01-10 LAB — POCT GLYCOSYLATED HEMOGLOBIN (HGB A1C): Hemoglobin A1C: 5.6 % (ref 4.0–5.6)

## 2024-01-10 MED ORDER — CLOPIDOGREL BISULFATE 75 MG PO TABS: 75.0000 mg | ORAL_TABLET | Freq: Every day | ORAL | 3 refills | Status: DC

## 2024-01-10 MED ORDER — ASPIRIN 81 MG PO TBEC: 81.0000 mg | DELAYED_RELEASE_TABLET | Freq: Every day | ORAL | Status: DC

## 2024-01-10 NOTE — Patient Instructions (Addendum)
 Let's do several things:  First, I'd like to MRI your brain to look for evidence of an old stroke. I am also going to re-start your daily Plavix  until we (or a cardiologist or neurologist) tell you otherwise.  I am checking some labs to evaluate both your stroke risk and other explanations for your symptoms. For your word-finding difficulty and falls, I want to have you seen in our Geriatrics clinic where Dr. McDiarmid can do some cognitive assessments and have you evaluated for PT needs.   JINNY Con Gasman, MD

## 2024-01-10 NOTE — Progress Notes (Signed)
 SUBJECTIVE:   CHIEF COMPLAINT / HPI:   Word-Finding Difficulty  Dizziness  Falls  Christina Hardin is here with her sister with a chief complaint of cognitive-neurologic decline that came on rather abruptly last fall and has been ongoing for the past few months.  She has had a constellation of symptoms, most notably significant word finding difficulty, episodic dizziness, and frequent falls. All of these started about the same time, though they cannot tell me exactly when.  Her sister has noticed this especially when they talk on the phone.  Her word finding difficulty is the most bothersome thing to her.  She also notes frequent ground-level falls that she attributes to disequilibrium.  Has not injured herself with these but is obviously concerned. They both voice concern for possible early Alzheimer's disease as there is a strong family history in both their mother and another of their sisters. She does have a history of paroxysmal A-fib and is on daily Xarelto .  Denies any lapses in her Xarelto  coverage. She is not on a statin.  Dysuria And off and, but the end of the visit.  Was recently been a course of Macrobid  for presumed UTI based on a MyChart encounter with Dr. Cleotilde.  Tells me that she is still having some symptoms.  Should be finishing her Macrobid  course tomorrow.  No fevers.  PERTINENT  PMH / PSH: Thoracic aortic aneurysm, stable at 4.2 cm as of her last imaging in 09/2023.  Iron  deficiency anemia, anxiety and depression, DCIS of the breast, diastolic heart failure, history of bariatric surgery, paroxysmal A-fib on Xarelto . PCI with stenting.   OBJECTIVE:   BP 103/84   Pulse 67   Ht 5' 4 (1.626 m)   Wt 194 lb 9.6 oz (88.3 kg)   SpO2 100%   BMI 33.40 kg/m   Gen: Age appropriate and NAD; obvious difficulty with word-finding over the course of our conversation  Cardio: RRR Pulm: Normal WOB on RA, speaking in full sentences, albeit with pauses for word-finding difficulty   Neuro: Antalgic gait secondary to knee pain which she and her sister state is at its baseline, speech with frequent and obvious halting due to word finding difficulty.  No appreciable slurring of her speech. Cranial nerves II through XII are intact.  She has good strength and sensation throughout the extremities. Balance is intact.  There is however, significant dysmetria on finger-to-nose testing.  Rapid alternating movements and heel shin testing is within normal limits.  ASSESSMENT/PLAN:   Assessment & Plan Word finding difficulty A fairly broad differential for this presentation.  However, given the concomitant word finding difficulty, disequilibrium, frequent falls, and dysmetria on neuro exam, I am most concerned for a possible posterior circulation stroke.  Other items for consideration include vitamin deficiency given her history of bariatric surgery versus neurocognitive decline in the setting of early dementia. -MRI brain ordered, to be done next week -B12, folate, TSH, HIV, RPR, A1c, lipid panel, CBC -On chart review, it appears that she is supposed to be on daily Plavix  given her history of drug-eluting stent but that this fell off of her med list when she moved to [place]  from [place] .  Will restart Plavix  at this time.  Remains on Xarelto . -Hold off on starting a statin for now pending the results of her lipid panel and MRI -Have scheduled her to follow-up in her geriatric clinic to further assess for underlying neurocognitive impairment -Referral made to speech therapy  Dizziness Differential as  above, also must consider symptomatic anemia given her history of iron  deficiency anemia. -Iron , TIBC, ferritin in addition to workup as above Frequent falls Is on Xarelto .  Thankfully has not injured herself to date. -Workup as above -Referral placed to physical therapy today Dysuria Mentioned at the end of the visit, already being treated with Macrobid . -Given persistence of  symptoms, will repeat UA at this time, can revisit in more detail at follow-up to assess for other causes such as atrophic vaginitis    J Con Gasman, MD [facility]

## 2024-01-11 LAB — CBC
Hematocrit: 35.7 % (ref 34.0–46.6)
Hemoglobin: 11.1 g/dL (ref 11.1–15.9)
MCH: 27.5 pg (ref 26.6–33.0)
MCHC: 31.1 g/dL — ABNORMAL LOW (ref 31.5–35.7)
MCV: 88 fL (ref 79–97)
Platelets: 246 10*3/uL (ref 150–450)
RBC: 4.04 x10E6/uL (ref 3.77–5.28)
RDW: 16.5 % — ABNORMAL HIGH (ref 11.7–15.4)
WBC: 6.3 10*3/uL (ref 3.4–10.8)

## 2024-01-11 LAB — IRON,TIBC AND FERRITIN PANEL
Ferritin: 15 ng/mL (ref 15–150)
Iron Saturation: 12 % — ABNORMAL LOW (ref 15–55)
Iron: 51 ug/dL (ref 27–139)
Total Iron Binding Capacity: 427 ug/dL (ref 250–450)
UIBC: 376 ug/dL — ABNORMAL HIGH (ref 118–369)

## 2024-01-11 LAB — MAGNESIUM: Magnesium: 2.3 mg/dL (ref 1.6–2.3)

## 2024-01-11 LAB — BASIC METABOLIC PANEL
BUN/Creatinine Ratio: 20 (ref 12–28)
BUN: 20 mg/dL (ref 8–27)
CO2: 20 mmol/L (ref 20–29)
Calcium: 11.2 mg/dL — ABNORMAL HIGH (ref 8.7–10.3)
Chloride: 105 mmol/L (ref 96–106)
Creatinine, Ser: 1 mg/dL (ref 0.57–1.00)
Glucose: 83 mg/dL (ref 70–99)
Potassium: 4.6 mmol/L (ref 3.5–5.2)
Sodium: 139 mmol/L (ref 134–144)
eGFR: 60 mL/min/{1.73_m2} (ref >59)

## 2024-01-11 LAB — LIPID PANEL
Chol/HDL Ratio: 3.2 {ratio} (ref 0.0–4.4)
Cholesterol, Total: 150 mg/dL (ref 100–199)
HDL: 47 mg/dL (ref >39)
LDL Chol Calc (NIH): 84 mg/dL (ref 0–99)
Triglycerides: 102 mg/dL (ref 0–149)
VLDL Cholesterol Cal: 19 mg/dL (ref 5–40)

## 2024-01-11 LAB — RPR: RPR Ser Ql: NONREACTIVE

## 2024-01-11 LAB — VITAMIN B12: Vitamin B-12: >2000 pg/mL — ABNORMAL HIGH (ref 232–1245)

## 2024-01-11 LAB — HIV ANTIBODY (ROUTINE TESTING W REFLEX): HIV Screen 4th Generation wRfx: NONREACTIVE

## 2024-01-11 LAB — TSH RFX ON ABNORMAL TO FREE T4: TSH: 0.698 u[IU]/mL (ref 0.450–4.500)

## 2024-01-11 LAB — FOLATE: Folate: 6 ng/mL (ref >3.0)

## 2024-01-13 ENCOUNTER — Telehealth: Payer: Self-pay

## 2024-01-13 NOTE — Telephone Encounter (Signed)
 Patient LVM on nurse line in regards to continued dysuria.   She reports she finished the antibiotic and felt better, however reports symptoms returned yesterday with burning with urination.   UA was collected at 2/7 office visit.   Attempted to call patient back to get more information, however no answer.   Will forward to provider who saw patient.

## 2024-01-14 ENCOUNTER — Ambulatory Visit: Payer: Medicare Other

## 2024-01-15 ENCOUNTER — Ambulatory Visit: Payer: Medicare Other

## 2024-01-15 VITALS — BP 128/90 | HR 79 | Ht 64.0 in | Wt 197.8 lb

## 2024-01-15 DIAGNOSIS — D649 Anemia, unspecified: Secondary | ICD-10-CM | POA: Diagnosis not present

## 2024-01-15 DIAGNOSIS — R296 Repeated falls: Secondary | ICD-10-CM

## 2024-01-15 DIAGNOSIS — D508 Other iron deficiency anemias: Secondary | ICD-10-CM | POA: Diagnosis not present

## 2024-01-15 DIAGNOSIS — K5900 Constipation, unspecified: Secondary | ICD-10-CM | POA: Diagnosis not present

## 2024-01-15 MED ORDER — FERROUS SULFATE 324 MG PO TBEC: 324.0000 mg | DELAYED_RELEASE_TABLET | ORAL | 2 refills | Status: DC

## 2024-01-15 NOTE — Assessment & Plan Note (Addendum)
New onset constipation, change in appetite, and drop in hgb worrisome for GI pathology. Patient is currently anticoagulated with Xarelto. Weight remains steady. Abdominal exam benign. GI referral placed today. Discussed starting miralax with titration for BM.

## 2024-01-15 NOTE — Patient Instructions (Signed)
I am sending a referral to gastroenterology due to you having new constipation and anemia.  You have been overdue for colonoscopy since 2023 this will be a good time for you to have this completed.  Your constipation I want you to take over-the-counter MiraLAX.  You could start with 1 capful but if you do not have a bowel movement up it to 2 capfuls.  Continue this until you have a bowel movement.  For your anemia I am going to be prescribing oral iron.  You should take this every other day as this can contribute to your constipation.  For your balance and falls I am going to send a referral to physical therapy.  You should receive a call in 1 to 2 weeks to get this scheduled.  You have an appointment scheduled with me in 2 weeks that we will go over your MRI results.  Future Appointments  Date Time Provider Department Center  01/17/2024  4:00 PM [facility]-MR 1 [facility]-MRI [facility]  01/21/2024  9:00 AM GI-[facility] DX DEXA 1 [facility] GI-BREAST [facility]  02/04/2024  1:30 PM Glendale Chard, DO [facility]  03/12/2024  1:30 PM [facility] GERIATRIC CLINIC [facility]  03/23/2024 10:40 AM [facility] ANNUAL WELLNESS VISIT [facility]    Please arrive 15 minutes before your appointment to ensure smooth check in process.    Please call the clinic at [phone] if your symptoms worsen or you have any concerns.  Thank you for allowing me to participate in your care, Dr. Glendale Chard [facility]

## 2024-01-15 NOTE — Progress Notes (Signed)
    SUBJECTIVE:   CHIEF COMPLAINT / HPI:   Christina Hardin is a 73 y.o. female  presenting for follow up.  Dysuria: improved with antibiotic. However still having mild discomfort when urinating. Does not drink much water during the day.  No fever, chills, CVA tenderness.    Constipation: Ongoing 2-3 months. Sometimes will go 2 weeks without a bowel movement. She has lost appetite and does not want to go out to eat like she used to. She can only tolerate bland foods in small amounts. She has tried OTC miralax intermittently without significant improvement. She reports straining with BMs. She denies black or bloody stools. Her last colonoscopy was done in 2013 which resulted with a small benign lymphoid polyp.   Mental impairment: struggles to give HPI.   PERTINENT  PMH / PSH: Reviewed and updated   OBJECTIVE:   BP (!) 128/90   Pulse 79   Ht 5\' 4"  (1.626 m)   Wt 197 lb 12.8 oz (89.7 kg)   SpO2 99%   BMI 33.95 kg/m   Well-appearing, no acute distress Cardio: Regular rate, regular rhythm, no murmurs on exam. Pulm: Clear, no wheezing, no crackles. No increased work of breathing Abdominal: bowel sounds present, soft, non-tender, non-distended Extremities: no peripheral edema  Neuro: alert and oriented x3, speech normal in content, no facial asymmetry, strength intact and equal bilaterally in UE and LE, pupils equal and reactive to light.  Psych:  Slow cognition. Alert, communicative  and cooperative with normal attention span and concentration. No apparent delusions, illusions, hallucinations      01/15/2024    9:49 AM 01/10/2024   11:59 AM 09/04/2023   10:33 AM  PHQ9 SCORE ONLY  PHQ-9 Total Score 3 6 7       ASSESSMENT/PLAN:   Anemia, iron deficiency On last CBC, Hgb dropping from 12.6> 11.1. Unclear source of anemia at this time. Starting oral iron supplementation every other day. Recheck CBC in 3 months.   Constipation New onset constipation, change in appetite, and drop in hgb  worrisome for GI pathology. Patient is currently anticoagulated with Xarelto. Weight remains steady. Abdominal exam benign. GI referral placed today. Discussed starting miralax with titration for BM.     Glendale Chard, DO [facility]

## 2024-01-15 NOTE — Assessment & Plan Note (Signed)
On last CBC, Hgb dropping from 12.6> 11.1. Unclear source of anemia at this time. Starting oral iron supplementation every other day. Recheck CBC in 3 months.

## 2024-01-16 NOTE — Telephone Encounter (Signed)
Called patient. She tells me that she "got everything taken care of" with Dr. Hyacinth Meeker yesterday. No further concerns.

## 2024-01-17 ENCOUNTER — Ambulatory Visit (HOSPITAL_COMMUNITY)
Admission: RE | Admit: 2024-01-17 | Discharge: 2024-01-17 | Disposition: A | Discharge status: Home / Self Care | Payer: Medicare Other | Source: Ambulatory Visit | Attending: Family Medicine | Admitting: Family Medicine

## 2024-01-17 DIAGNOSIS — R4789 Other speech disturbances: Secondary | ICD-10-CM | POA: Insufficient documentation

## 2024-01-17 DIAGNOSIS — R42 Dizziness and giddiness: Secondary | ICD-10-CM | POA: Diagnosis present

## 2024-01-21 ENCOUNTER — Other Ambulatory Visit: Payer: Self-pay | Admitting: Student

## 2024-01-21 ENCOUNTER — Inpatient Hospital Stay: Admission: RE | Admit: 2024-01-21 | Payer: Medicare HMO | Source: Ambulatory Visit

## 2024-01-21 DIAGNOSIS — M810 Age-related osteoporosis without current pathological fracture: Secondary | ICD-10-CM

## 2024-01-22 ENCOUNTER — Ambulatory Visit: Payer: Medicare Other | Attending: Family Medicine

## 2024-02-04 ENCOUNTER — Ambulatory Visit: Payer: Self-pay | Admitting: Student

## 2024-02-04 ENCOUNTER — Telehealth: Payer: Self-pay

## 2024-02-04 NOTE — Telephone Encounter (Signed)
 Patient calls nurse line in regards to MRI and apt this afternoon.   She reports she noticed on her mychart her MRI has not come back yet.   She reports the purpose of this afternoons visit was to discuss findings.   She reports she is fine with rescheduling or if PCP wants to see her.   Advised will forward to PCP.

## 2024-02-04 NOTE — Telephone Encounter (Signed)
 Patient rescheduled for 02/25/24.

## 2024-02-10 ENCOUNTER — Other Ambulatory Visit (HOSPITAL_COMMUNITY): Payer: Self-pay | Admitting: Student

## 2024-02-10 MED ORDER — ROSUVASTATIN CALCIUM 20 MG PO TABS: 20.0000 mg | ORAL_TABLET | Freq: Every day | ORAL | 3 refills | Status: DC

## 2024-02-11 ENCOUNTER — Ambulatory Visit: Payer: Medicare Other | Attending: Family Medicine

## 2024-02-11 DIAGNOSIS — R41841 Cognitive communication deficit: Secondary | ICD-10-CM | POA: Insufficient documentation

## 2024-02-11 DIAGNOSIS — R4789 Other speech disturbances: Secondary | ICD-10-CM | POA: Insufficient documentation

## 2024-02-11 NOTE — Therapy (Unsigned)
 OUTPATIENT SPEECH LANGUAGE PATHOLOGY EVALUATION   Patient Name: Christina Hardin MRN: [id number] DOB:Jan 17, 1951, 73 y.o., female Today's Date: 02/12/2024  PCP: Glendale Chard DO REFERRING PROVIDER: Latrelle Dodrill, MD  END OF SESSION:  End of Session - 02/11/24 1309     Visit Number 1    Number of Visits 17    Date for SLP Re-Evaluation 04/07/24    Authorization Type [employer] medicare - auth requested    SLP Start Time 1315    SLP Stop Time  1400    SLP Time Calculation (min) 45 min    Activity Tolerance Patient tolerated treatment well             Past Medical History:  Diagnosis Date   A-fib (HCC)    Anxiety    Breast cancer (HCC) 09/05/2016   left breast dcis   Carpal tunnel syndrome    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   Ductal carcinoma in situ (DCIS) of left breast    Family history of breast cancer    Gastric bypass status for obesity    Personal history of chemotherapy 2017   Thoracic aortic aneurysm [facility])    Thoracic aortic aneurysm (HCC)    Past Surgical History:  Procedure Laterality Date   ABDOMINAL HYSTERECTOMY     APPENDECTOMY  1958   BLADDER REPAIR     BREAST BIOPSY Left    BREAST LUMPECTOMY WITH RADIOACTIVE SEED AND SENTINEL LYMPH NODE BIOPSY Left 10/09/2016   Procedure: BREAST LUMPECTOMY WITH RADIOACTIVE SEED AND SENTINEL LYMPH NODE BIOPSY;  Surgeon: Manus Rudd, MD;  Location: [facility];  Service: General;  Laterality: Left;   CARPAL TUNNEL RELEASE Left 08/22/2017   Procedure: LEFT CARPAL TUNNEL RELEASE;  Surgeon: Cindee Salt, MD;  Location: [facility];  Service: Orthopedics;  Laterality: Left;   CARPOMETACARPEL SUSPENSION PLASTY Left 08/22/2017   Procedure: SUSPENSIONPLASTY LEFT THUMB TRAPENZIUM EXCISION ABDUCTOR POLLICIS LONGUS;  Surgeon: Cindee Salt, MD;  Location: [facility];  Service: Orthopedics;  Laterality: Left;   COLONOSCOPY W/ POLYPECTOMY     GASTRIC BYPASS      HYSTERECTOMY ABDOMINAL WITH SALPINGECTOMY     ULNAR NERVE TRANSPOSITION Left 08/22/2017   Procedure: ULNAR NERVE DECOMPRESSION;  Surgeon: Cindee Salt, MD;  Location: [facility];  Service: Orthopedics;  Laterality: Left;   Patient Active Problem List   Diagnosis Date Noted   Constipation 01/15/2024   Bilateral hip pain 09/04/2023   Coronary artery disease involving native coronary artery of native heart without angina pectoris 11/16/2021   Lumbar back pain with radiculopathy affecting right lower extremity 10/27/2018   Carcinoma of breast upper outer quadrant (HCC) 11/16/2016   Bursitis of right knee 08/10/2016   Diastolic dysfunction without heart failure 07/06/2016   Paroxysmal atrial fibrillation (HCC) 06/21/2016   Thoracic aortic aneurysm (HCC) 05/25/2016   Anemia, iron deficiency 05/25/2016   H/O bariatric surgery 12/20/2014   H/O hysterectomy with oophorectomy 10/09/2013   Restless leg syndrome 10/09/2013   Carpal tunnel syndrome 09/11/2013   Osteopenia 09/11/2013   Hereditary and idiopathic peripheral neuropathy 09/11/2013   Colon polyp 05/02/2012   NEPHROLITHIASIS 09/17/2008   OBESITY, NOS 01/30/2007   Anxiety and depression 01/30/2007   MIGRAINE, UNSPEC., W/O INTRACTABLE MIGRAINE 01/30/2007   Essential hypertension 01/30/2007   REFLUX ESOPHAGITIS 01/30/2007   DIVERTICULOSIS OF COLON 01/30/2007   ROSACEA 01/30/2007   APNEA, SLEEP 01/30/2007    ONSET DATE: 01/10/24 (referral date)   REFERRING DIAG:  R47.89 (ICD-10-CM) - Word finding difficulty  THERAPY DIAG: Cognitive communication deficit  Rationale for Evaluation and Treatment: Rehabilitation  SUBJECTIVE:   SUBJECTIVE STATEMENT: Endorsed intermittent difficulty with communication over last several months, more so outside the home. Exhibits intermittent halting and pausing in conversation resulting in mildly dysfluent speech pattern. Pt notes she occasionally repeating self but denies stutters. Overt  pausing and delays exhibited in conversation today. Pt accompanied by: self  PERTINENT HISTORY: Thoracic aortic aneurysm, stable at 4.2 cm as of her last imaging in 09/2023.  Iron deficiency anemia, anxiety and depression, DCIS of the breast, diastolic heart failure, history of bariatric surgery, paroxysmal A-fib on Xarelto. PCI with stenting.   "Ms. Dobratz is here with her sister with a chief complaint of cognitive-neurologic decline that came on rather abruptly last fall and has been ongoing for the past few months.  She has had a constellation of symptoms, most notably significant word finding difficulty, episodic dizziness, and frequent falls. All of these started about the same time, though they cannot tell me exactly when.  Her sister has noticed this especially when they talk on the phone.  Her word finding difficulty is the most bothersome thing to her.  She also notes frequent ground-level falls that she attributes to disequilibrium.  Has not injured herself with these but is obviously concerned. They both voice concern for possible early Alzheimer's disease as there is a strong family history in both their mother and another of their sisters."  PAIN: Are you having pain? No  FALLS: Has patient fallen in last 6 months?  Yes, Comment: PT evaluation already requested  LIVING ENVIRONMENT: Lives with: lives with their family Lives in: House/apartment  PLOF:  Level of assistance: Independent with ADLs, Independent with IADLs Employment: Retired  PATIENT GOALS: improve communication   OBJECTIVE:  Note: Objective measures were completed at Evaluation unless otherwise noted.  DIAGNOSTIC FINDINGS: MRI 01/27/24 No acute intracranial abnormality. Mild-to-moderate chronic microvascular ischemic changes and parenchymal volume loss.  COGNITION: Overall cognitive status: Impaired Areas of impairment:  Attention: Impaired: Sustained, Selective Memory: Impaired: Working, IT consultant  function: Impaired: Slow processing Functional deficits: Endorsed some memory changes, including managing new changes in routine. Has reported some difficulty recalling to take nightly medications but will usually recall after short time. Son has been assisting with financial management due to fear of error. Aware of frequently losing train of thought in conversation, with partner providing cues to redirect back to conversation. Questions Alzheimer's Disease given familial hx.   VERBAL EXPRESSION: Level of generative/spontaneous verbalization: conversation Naming: Confrontation: 76-100%  Comments: Occasional anomia (self-corrected); intermittent pausing, halting, and fillers in connected speech Interfering components: attention and speech fluency Effective strategies: description, sentence starters, written cues, phonemic cues   ORAL MOTOR EXAMINATION: Overall status: WFL  STANDARDIZED ASSESSMENTS: [place] NAMING TEST: 48/60 (< WNL)  PATIENT REPORTED OUTCOME MEASURES (PROM): Communication Participation Item Bank: 24  The Communicative Participation Item Bank        Does your condition interfere with... Pt Rating   ...talking with people you know 3   ...communicating when you need to say something quickly 3   ...talking with people you do not know 2   ...communicating when you are out in your community 3   ...asking questions in a conversation 2   ....communicating in a small group of people 2   ...having a long conversation 3   ...giving detailed infomrmation 2   ...getting your turn in a fast moving conversation 2   .Marland KitchenMarland Kitchen  trying to persuade a friend or family member to see a different point of view 2  3= Not at all; 2=A little; 1=Quite a bit; 0=Very much                                                                                                                            TREATMENT DATE:  02-11-24: eval only  PATIENT EDUCATION: Education details: POC, possible goals Person  educated: Patient Education method: Explanation Education comprehension: verbalized understanding and needs further education   GOALS: Goals reviewed with patient? Yes  SHORT TERM GOALS: Target date: 03/10/2024  Pt will utilize speech strategies to optimize speech fluency on structured tasks given occasional min A  Baseline: Goal status: INITIAL  2.  Pt will utilize speech strategies to optimize speech fluency during structured conversations given occasional min A  Baseline:  Goal status: INITIAL  3. Pt will demonstrate word retrieval strategies on structured tasks given occasional min A Baseline:  Goal status: INITIAL  4.  Pt will employ word retrieval strategies during structured conversations given occasional min A Baseline:  Goal status: INITIAL  5.  Pt will implement cognitive compensations to aid recall and completion of personally relevant tasks given occasional min A  Baseline:  Goal status: INITIAL   LONG TERM GOALS: Target date: 04/07/2024  Pt will utilize speech strategies to optimize speech fluency during unstructured conversations given rare min A  Baseline:  Goal status: INITIAL  2.  Pt will employ word retrieval strategies during unstructured conversations given rare min A Baseline:  Goal status: INITIAL  3.  Pt will implement cognitive compensations to aid recall and completion of personally relevant tasks > 1 week at home Baseline:  Goal status: INITIAL  ASSESSMENT:  CLINICAL IMPRESSION: Patient is a 73 y.o. F who was seen today for ST evaluation for word finding difficulty. Reported cognitive decline over last year, most notably word finding episodes per MD report. Today, pt presents with mild speech dysfluency at discourse level c/b intermittent pausing, halting, and fillers, which appears in part related to processing speed and attention. Occasional episodes of anomia were usually self-corrected with extra processing time. Limited communication reported  at home while family works. Expressed concern for presence of progressive neurologic disease given familial hx. Despite unknown etiology of cognitive communication changes, pt would benefit from skilled ST intervention to train compensatory strategies and techniques to optimize current cognitive functioning and communication effectiveness.   OBJECTIVE IMPAIRMENTS: include attention, memory, and expressive language. These impairments are limiting patient from household responsibilities and effectively communicating at home and in community. Factors affecting potential to achieve goals and functional outcome are  unknown etiology . Patient will benefit from skilled SLP services to address above impairments and improve overall function.  REHAB POTENTIAL: Good  PLAN:  SLP FREQUENCY: 1-2x/week  SLP DURATION: 8 weeks  PLANNED INTERVENTIONS: Language facilitation, Environmental controls, Cueing hierachy, Internal/external aids, Functional tasks, Multimodal communication approach, SLP instruction and feedback,  Compensatory strategies, Patient/family education, [phone] Treatment of speech (30 or 45 min) , and [phone]- Speech [address] [place], Phon, Eval Compre, Express   Gracy Racer, CCC-SLP 02/12/2024, 9:14 AM

## 2024-02-11 NOTE — Patient Instructions (Signed)

## 2024-02-19 ENCOUNTER — Ambulatory Visit: Admitting: Speech Pathology

## 2024-02-19 ENCOUNTER — Ambulatory Visit: Admitting: Physical Therapy

## 2024-02-25 ENCOUNTER — Encounter: Payer: Self-pay | Admitting: Student

## 2024-02-25 ENCOUNTER — Ambulatory Visit (INDEPENDENT_AMBULATORY_CARE_PROVIDER_SITE_OTHER): Admitting: Student

## 2024-02-25 VITALS — BP 122/85 | HR 90 | Ht 64.0 in | Wt 198.0 lb

## 2024-02-25 DIAGNOSIS — I480 Paroxysmal atrial fibrillation: Secondary | ICD-10-CM | POA: Diagnosis not present

## 2024-02-25 DIAGNOSIS — F03A0 Unspecified dementia, mild, without behavioral disturbance, psychotic disturbance, mood disturbance, and anxiety: Secondary | ICD-10-CM | POA: Insufficient documentation

## 2024-02-25 DIAGNOSIS — R4189 Other symptoms and signs involving cognitive functions and awareness: Secondary | ICD-10-CM | POA: Diagnosis not present

## 2024-02-25 DIAGNOSIS — I2510 Atherosclerotic heart disease of native coronary artery without angina pectoris: Secondary | ICD-10-CM

## 2024-02-25 DIAGNOSIS — Z8669 Personal history of other diseases of the nervous system and sense organs: Secondary | ICD-10-CM | POA: Insufficient documentation

## 2024-02-25 MED ORDER — ROSUVASTATIN CALCIUM 20 MG PO TABS: 20.0000 mg | ORAL_TABLET | Freq: Every day | ORAL | 3 refills | Status: DC

## 2024-02-25 NOTE — Progress Notes (Signed)
    SUBJECTIVE:   CHIEF COMPLAINT / HPI:   Christina Hardin is a 73 y.o. female  presenting for worsening dyspnea on exertion.   Dyspnea on exertion: She is having to take more frequent breaks when doing physical activities around the house. Gets SOB with activity, does not get chest pressure just feels like she is going to "give out". Has occasional pain over the left side of her chest not related to activity. Happens 2-3 times per week, usually lasts less than 1 minute. Can happen multiple times a night.  Does not have any peripheral swelling, can sleep flat at night.  History of RCA DES November 2022.  Has not been taking Crestor even though this medication has been prescribed.  Worsening overall MSK pain. Takes tylenol PM and this improves her pain.   PERTINENT  PMH / PSH: Reviewed and updated   OBJECTIVE:   BP 122/85   Pulse 90   Ht 5\' 4"  (1.626 m)   Wt 198 lb (89.8 kg)   SpO2 97%   BMI 33.99 kg/m   Chronically ill-appearing, no acute distress Cardio: Regular rate, regular rhythm, no murmurs on exam. Pulm: Clear, no wheezing, no crackles. No increased work of breathing Abdominal: bowel sounds present, soft, non-tender, non-distended Extremities: no peripheral edema  Neuro: alert and oriented x3, speech normal in content, no facial asymmetry, strength intact and equal bilaterally in UE and LE, pupils equal and reactive to light.  Psych: Tangential. Alert, communicative  and cooperative with normal attention span but impaired concentration. No apparent delusions, illusions, hallucinations      02/25/2024    1:28 PM 01/15/2024    9:49 AM 01/10/2024   11:59 AM  PHQ9 SCORE ONLY  PHQ-9 Total Score 6 3 6       ASSESSMENT/PLAN:   Coronary artery disease involving native coronary artery of native heart without angina pectoris With patient's worsening shortness of breath with exertion and history of CAD with stent placed in 2022 there is concern for worsening CAD.  Patient is also  not been compliant with her Crestor 20 mg daily.  Sent new prescription to the pharmacy and instructed patient to take at this visit.  Previous cholesterol panel was done 01/2024 and her LDL is 83 which is not within goal of less than 70. -Referral to cardiology placed today -Deferred EKG to due to not currently having chest pain -Restarted Crestor 20 mg  Cognitive impairment MRI showing findings consistent with chronic degenerative changes.  Patient has a history of Alzheimer's disease.  Discussed imaging findings with her today and reminded her that she has an appointment with our geriatric clinic 03/12/24.  Instructed her to discontinue Tylenol PM due to the Benadryl being harmful for cognitive function.     Glendale Chard, DO [place]

## 2024-02-25 NOTE — Assessment & Plan Note (Signed)
 With patient's worsening shortness of breath with exertion and history of CAD with stent placed in 2022 there is concern for worsening CAD.  Patient is also not been compliant with her Crestor 20 mg daily.  Sent new prescription to the pharmacy and instructed patient to take at this visit.  Previous cholesterol panel was done 01/2024 and her LDL is 83 which is not within goal of less than 70. -Referral to cardiology placed today -Deferred EKG to due to not currently having chest pain -Restarted Crestor 20 mg

## 2024-02-25 NOTE — Assessment & Plan Note (Signed)
 MRI showing findings consistent with chronic degenerative changes.  Patient has a history of Alzheimer's disease.  Discussed imaging findings with her today and reminded her that she has an appointment with our geriatric clinic 03/12/24.  Instructed her to discontinue Tylenol PM due to the Benadryl being harmful for cognitive function.

## 2024-02-25 NOTE — Patient Instructions (Addendum)
 It was great to see you today!   I am sending a referral to cardiology due to your SOB.   START TAKING your crestor (cholesterol medication)  STOP taking Tylenol PM, you can take regular tylenol   Future Appointments  Date Time Provider Department Center  03/12/2024  1:30 PM [facility] G[facility]ATRIC [facility]INIC [facility]  03/23/2024 10:40 AM [facility] ANNUAL WELLNESS VISIT [facility]  09/16/2024 11:30 AM [facility] DX DEXA 1 [facility]    Please arrive 15 minutes before your appointment to ensure smooth check in process.    Please call the clinic at [phone] if your symptoms worsen or you have any concerns.  Thank you for allowing me to participate in your care, Dr. Glendale Chard [facility]

## 2024-03-02 ENCOUNTER — Ambulatory Visit: Admitting: Physical Therapy

## 2024-03-02 ENCOUNTER — Encounter: Admitting: Speech Pathology

## 2024-03-10 ENCOUNTER — Encounter: Payer: Self-pay | Admitting: Family Medicine

## 2024-03-10 ENCOUNTER — Ambulatory Visit: Admitting: Physical Therapy

## 2024-03-10 ENCOUNTER — Encounter

## 2024-03-10 DIAGNOSIS — Q625 Duplication of ureter: Secondary | ICD-10-CM

## 2024-03-10 DIAGNOSIS — N3946 Mixed incontinence: Secondary | ICD-10-CM | POA: Insufficient documentation

## 2024-03-10 DIAGNOSIS — M48062 Spinal stenosis, lumbar region with neurogenic claudication: Secondary | ICD-10-CM | POA: Insufficient documentation

## 2024-03-10 DIAGNOSIS — N200 Calculus of kidney: Secondary | ICD-10-CM

## 2024-03-10 DIAGNOSIS — R3129 Other microscopic hematuria: Secondary | ICD-10-CM | POA: Insufficient documentation

## 2024-03-10 DIAGNOSIS — N390 Urinary tract infection, site not specified: Secondary | ICD-10-CM

## 2024-03-10 DIAGNOSIS — N952 Postmenopausal atrophic vaginitis: Secondary | ICD-10-CM | POA: Insufficient documentation

## 2024-03-10 HISTORY — DX: Calculus of kidney: N20.0

## 2024-03-10 HISTORY — DX: Urinary tract infection, site not specified: N39.0

## 2024-03-10 HISTORY — DX: Duplication of ureter: Q62.5

## 2024-03-12 ENCOUNTER — Ambulatory Visit: Payer: Medicare Other

## 2024-03-12 VITALS — BP 160/96 | HR 63 | Ht 63.39 in | Wt 202.2 lb

## 2024-03-12 DIAGNOSIS — F03A0 Unspecified dementia, mild, without behavioral disturbance, psychotic disturbance, mood disturbance, and anxiety: Secondary | ICD-10-CM | POA: Diagnosis not present

## 2024-03-12 DIAGNOSIS — R7989 Other specified abnormal findings of blood chemistry: Secondary | ICD-10-CM | POA: Diagnosis not present

## 2024-03-12 DIAGNOSIS — Z79899 Other long term (current) drug therapy: Secondary | ICD-10-CM

## 2024-03-12 DIAGNOSIS — R4789 Other speech disturbances: Secondary | ICD-10-CM | POA: Diagnosis not present

## 2024-03-12 DIAGNOSIS — I7121 Aneurysm of the ascending aorta, without rupture: Secondary | ICD-10-CM

## 2024-03-12 NOTE — Progress Notes (Addendum)
 Pharmacy consulted to complete medication reconciliation for geriatric clinic. Patient arrives in good spirits. Medication reconciliation completed by patient.   Medication list updated   Medication Issues Identified: Does NOT appear to be taking  Ferrous sulfate Alendronate  Clopidogrel Or  Diclofenac Gel   Medication Samples have been provided to the patient.  Drug name: Xarelto (rivaroxaban)       Strength: 20mg         Qty: 28  LOT: [id number], [id number]  Exp.Date: 10/02/2024  Dosing instructions: daily with suppler  The patient has been instructed regarding the correct time, dose, and frequency of taking this medication, including desired effects and most common side effects.   Madelon Lips 3:17 PM 03/12/2024     Patient seen with Darolyn Rua, PharmD Candidate.

## 2024-03-12 NOTE — Progress Notes (Deleted)
 Christina Hardin is {Pc accompanied by:5710} Sources of clinical information for visit is/are {Information source:60032}. Nursing assessment for this office visit was reviewed with the patient for accuracy and revision.     Previous Report(s) Reviewed: {Outside review:15817}     02/25/2024    1:28 PM  Depression screen PHQ 2/9  Decreased Interest 1  Down, Depressed, Hopeless 0  PHQ - 2 Score 1  Altered sleeping 0  Tired, decreased energy 3  Change in appetite 2  Feeling bad or failure about yourself  0  Trouble concentrating 0  Moving slowly or fidgety/restless 0  Suicidal thoughts 0  PHQ-9 Score 6   Flowsheet Row Office Visit from 02/25/2024 in [facility] - A Dept Of [place]. [facility] Office Visit from 01/15/2024 in [facility] - A Dept Of Eligha Bridegroom. [facility] Office Visit from 01/10/2024 in [facility] - A Dept Of Eligha Bridegroom. [facility]  Thoughts that you would be better off dead, or of hurting yourself in some way Not at all Not at all Not at all  PHQ-9 Total Score 6 3 6           02/25/2024    1:23 PM 01/15/2024    9:49 AM 01/10/2024   11:29 AM 09/04/2023   10:32 AM 06/03/2023    1:38 PM  Fall Risk   Falls in the past year? 0 0 0 0 0  Number falls in past yr: 0 0 0 0 0  Injury with Fall? 0 0 0 0 0  Risk for fall due to :     No Fall Risks       02/25/2024    1:28 PM 01/15/2024    9:49 AM 01/10/2024   11:59 AM  PHQ9 SCORE ONLY  PHQ-9 Total Score 6 3 6     There are no preventive care reminders to display for this patient.  Health Maintenance Due  Topic Date Due   Zoster Vaccines- Shingrix (2 of 2) 05/28/2021   Pneumonia Vaccine 39+ Years old (3 of 3 - PCV20 or PCV21) 06/21/2021   Colonoscopy  04/30/2022   COVID-19 Vaccine (5 - 2024-25 season) 08/04/2023   DTaP/Tdap/Td (4 - Td or Tdap) 10/10/2023   Medicare Annual Wellness (AWV)  03/18/2024      History/P.E. limitations: {exam; limitations  ed:60112}  There are no preventive care reminders to display for this patient. There are no preventive care reminders to display for this patient.  Health Maintenance Due  Topic Date Due   Zoster Vaccines- Shingrix (2 of 2) 05/28/2021   Pneumonia Vaccine 49+ Years old (3 of 3 - PCV20 or PCV21) 06/21/2021   Colonoscopy  04/30/2022   COVID-19 Vaccine (5 - 2024-25 season) 08/04/2023   DTaP/Tdap/Td (4 - Td or Tdap) 10/10/2023   Medicare Annual Wellness (AWV)  03/18/2024     No chief complaint on file.    --------------------------------------------------------------------------------------------------------------------------------------------- Visit Problem List with A/P  No problem-specific Assessment & Plan notes found for this encounter.

## 2024-03-12 NOTE — Progress Notes (Signed)
 Provider:  Acquanetta Belling, MD Location:      Place of Service:     PCP: Glendale Chard, DO Patient Care Team: Glendale Chard, DO as PCP - General (Family Medicine) Rennis Golden Lisette Abu, MD as PCP - Cardiology (Cardiology) Serena Croissant, MD as Consulting Physician (Hematology and Oncology) Glenna Fellows, MD as Consulting Physician (Plastic Surgery) Dorothy Puffer, MD as Consulting Physician (Radiation Oncology) Axel Filler, Larna Daughters, NP as Nurse Practitioner (Hematology and Oncology) Manus Rudd, MD as Consulting Physician (General Surgery) Rennis Golden Lisette Abu, MD as Consulting Physician (Cardiology) Glyn Ade, PA-C as Physician Assistant (Dermatology)  Extended Emergency Contact Information Primary Emergency Contact: Nicolasa Ducking [place] Home Phone: [phone] Relation: Son  Code Status: DNR Goals of Care: Advanced Directive information    03/12/2024    1:21 PM  Advanced Directives  Does Patient Have a Medical Advance Directive? No  Would patient like information on creating a medical advance directive? No - Patient declined   Discussed the use of AI scribe software for clinical note transcription with the patient, who gave verbal consent to proceed.  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 She reports forgetfulness, difficulty with word finding, and occasional disorientation, particularly when driving at night. The patient notes that these cognitive issues have been present for several months and seem to be worsening. She expresses concern about the possibility of dementia, as her mother and her siblings all had the condition.  In addition to cognitive concerns, the patient reports chronic pain, particularly in the back and hips. This pain significantly limits her mobility and activity level, preventing her from doing household chores,  shopping, and attending church. The patient also reports difficulty sleeping due to the pain and has been advised to stop taking Tylenol PM. Despite these challenges, the patient reports being in good spirits most of the time and does not feel her life is empty or hopeless.  The patient lives with her two sons, who assist with grocery shopping and other tasks. She is still able to drive short distances during the day but avoids driving at night due to disorientation. The patient also reports occasional difficulty with medication management, particularly remembering to take her evening dose.   [facility]:   Patient is alone Primary caregiver:  Widowed,   Patient's Currently living arrangement:  two sons live with her and help her when needed.  They work during the day Patient information was obtained from lab reports and imaging reports, 02/11/24 Speech Therapy evaluation   . 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 She reports forgetfulness, difficulty with word finding, and occasional disorientation, particularly when driving at night. The patient notes that these cognitive issues have been present for several months and seem to be worsening. She expresses concern about the possibility of dementia, as her mother and her siblings all had the condition.  In addition to cognitive  concerns, the patient reports chronic pain, particularly in the back and hips. This pain significantly limits her mobility and activity level, preventing her from doing household  chores, shopping, and attending church. The patient also reports difficulty sleeping due to the pain and has been advised to stop taking Tylenol PM. Despite these challenges, the patient reports being in good spirits most of the time and does not feel her life is empty or hopeless.  The patient lives with her two sons, who assist with grocery shopping and other tasks. She is still able to drive short distances during the day but avoids driving at night due to disorientation. The patient also reports occasional difficulty with medication management, particularly remembering to take her evening dose.  Cognitive impairment concern  Are there problems with thinking?  Cognition domains: Memory difficulties  When were the changes first noticed?  months  Did this change occur abruptly or gradually?  abrupt  How have the changes progressed since then?  gradually worsening  Has there been any tremors or abnormal movements?  no  Do they still have interests or activities they enjoyed doing?  No, they have anxiety around driving.  Problem remembering things about family and friends e.g. names,  occupations, birthdays, addresses?  no  Problem remembering conversations or news events a few days later?  no  Problem remembering what day and month it is? yes  Problem with losing things?  yes  Problem handling financial matters, e.g. their pension, checking, credit cards, dealing with the bank?  Eldest son is helping with managing her finances    Geriatric Depression Scale:  4 / 15  PHQ-9: Flowsheet Row Office Visit from 02/25/2024 in [facility] [place]  PHQ-9 Total Score 6        Outpatient Encounter Medications as of 03/12/2024  Medication Sig    acetaminophen (TYLENOL) 325 MG tablet Take 325-650 mg by mouth every 6 (six) hours as needed.   Cyanocobalamin 2500 MCG CHEW Chew 2,500 mcg by mouth daily. Vitamin B12   gabapentin (NEURONTIN) 300 MG capsule TAKE 2 CAPSULES BY MOUTH 3 TIMES DAILY.   losartan (COZAAR) 100 MG tablet Take 1 tablet (100 mg total) by mouth daily.   metoprolol succinate (TOPROL-XL) 25 MG 24 hr tablet Take 1 tablet (25 mg total) by mouth daily.   rivaroxaban (XARELTO) 20 MG TABS tablet Take 1 tablet (20 mg total) by mouth daily with supper. With a meal   alendronate (FOSAMAX) 35 MG tablet Take 35 mg by mouth once a week. (Patient not taking: Reported on 03/12/2024)   clopidogrel (PLAVIX) 75 MG tablet Take 1 tablet (75 mg total) by mouth daily. (Patient not taking: Reported on 03/12/2024)   diclofenac Sodium (VOLTAREN) 1 % GEL Apply 2 g topically 4 (four) times daily. (Patient not taking: Reported on 03/12/2024)   rosuvastatin (CRESTOR) 20 MG tablet Take 1 tablet (20 mg total) by mouth daily. (Patient not taking: Reported on 03/12/2024)   [DISCONTINUED] diphenhydramine-acetaminophen (TYLENOL PM) 25-500 MG TABS tablet Take 1 tablet by mouth at bedtime as needed. (Patient not taking: Reported on 03/12/2024)   [DISCONTINUED] ferrous sulfate 324 MG TBEC Take 1 tablet (324 mg total) by mouth every other day. (Patient not taking: Reported on 03/12/2024)   No facility-administered encounter medications on file as of 03/12/2024.    History Patient Active Problem List   Diagnosis Date Noted   Word finding difficulty 03/12/2024    Priority: High   History of impaired cognition 02/25/2024    Priority: High   Paroxysmal atrial fibrillation (HCC) 06/21/2016    Priority: High  Thoracic aortic aneurysm (HCC) 05/25/2016    Priority: High   APNEA, SLEEP 08/20/2000    Priority: High   Hypercalcemia 03/12/2024    Priority: Medium    Microscopic hematuria 03/10/2024    Priority: Medium    Anemia, iron deficiency 05/25/2016     Priority: Medium    H/O bariatric surgery 12/20/2014    Priority: Medium    Restless leg syndrome 10/09/2013    Priority: Medium    Hereditary and idiopathic peripheral neuropathy 09/11/2013    Priority: Medium    Anxiety and depression 01/30/2007    Priority: Medium    MIGRAINE, UNSPEC., W/O INTRACTABLE MIGRAINE 01/30/2007    Priority: Medium    Essential hypertension 01/30/2007    Priority: Medium    Atrophic vaginitis 03/10/2024    Priority: Low   Mixed incontinence 03/10/2024    Priority: Low   Spinal stenosis, lumbar region with neurogenic claudication     Priority: Low   Constipation 01/15/2024    Priority: Low   Bilateral hip pain 09/04/2023    Priority: Low   Coronary artery disease involving native coronary artery of native heart without angina pectoris 11/16/2021    Priority: Low   Lumbar back pain with radiculopathy affecting right lower extremity 10/27/2018    Priority: Low   Carpal tunnel syndrome 09/11/2013    Priority: Low   Osteopenia 09/11/2013    Priority: Low   Colon polyp 05/02/2012    Priority: Low   OBESITY, NOS 01/30/2007    Priority: Low   Mild dementia (HCC) 03/12/2024   History of breast cancer 11/16/2016   Past Medical History:  Diagnosis Date   A-fib [facility])    Anxiety    Bilateral kidney stones 03/10/2024   Breast cancer (HCC) 09/05/2016   left breast dcis   Bursitis of right knee 08/10/2016   Carpal tunnel syndrome    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   Diastolic dysfunction without heart failure 07/06/2016   Diverticulosis of colon 01/30/2007   Qualifier: Diagnosis of   By: Bryson Ha MD, Gwendalyn Ege     Replacing diagnoses that were inactivated after the 03/04/23 regulatory import     Ductal carcinoma in situ (DCIS) of left breast    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   Family history of breast cancer    Gastric bypass status for obesity    History of breast cancer 11/16/2016   NEPHROLITHIASIS 09/17/2008    Qualifier: Diagnosis of   By: Leveda Anna MD, Chrissie Noa         Personal history of chemotherapy 2017   Recurrent UTI 03/10/2024   Reflux esophagitis 01/30/2007   Qualifier: Diagnosis of   By: Bryson Ha MD, Gwendalyn Ege        Renal cyst, right 09/17/2008   Qualifier: Diagnosis of   By: Leveda Anna MD, Chrissie Noa         Rosacea 01/30/2007   Qualifier: Diagnosis of   By: Bryson Ha MD, Gwendalyn Ege        Thoracic aortic aneurysm [facility])    Thoracic aortic aneurysm [facility])    Past Surgical History:  Procedure Laterality Date   ABDOMINAL HYSTERECTOMY     APPENDECTOMY  1958   BLADDER REPAIR     BREAST BIOPSY Left    BREAST LUMPECTOMY WITH RADIOACTIVE SEED AND SENTINEL LYMPH NODE BIOPSY Left 10/09/2016   Procedure: BREAST LUMPECTOMY WITH RADIOACTIVE SEED AND SENTINEL LYMPH NODE BIOPSY;  Surgeon: Manus Rudd, MD;  Location: [facility];  Service:  General;  Laterality: Left;   CARPAL TUNNEL RELEASE Left 08/22/2017   Procedure: LEFT CARPAL TUNNEL RELEASE;  Surgeon: Cindee Salt, MD;  Location: [facility];  Service: Orthopedics;  Laterality: Left;   CARPOMETACARPEL SUSPENSION PLASTY Left 08/22/2017   Procedure: SUSPENSIONPLASTY LEFT THUMB TRAPENZIUM EXCISION ABDUCTOR POLLICIS LONGUS;  Surgeon: Cindee Salt, MD;  Location: [facility];  Service: Orthopedics;  Laterality: Left;   COLONOSCOPY W/ POLYPECTOMY     GASTRIC BYPASS     HYSTERECTOMY ABDOMINAL WITH SALPINGECTOMY     ULNAR NERVE TRANSPOSITION Left 08/22/2017   Procedure: ULNAR NERVE DECOMPRESSION;  Surgeon: Cindee Salt, MD;  Location: [facility];  Service: Orthopedics;  Laterality: Left;   Family History  Problem Relation Age of Onset   Atrial fibrillation Mother    Dementia Mother    Hypertension Mother    Gout Mother    Arthritis Mother    Stroke Mother    COPD Father    Emphysema Father    Lung cancer Father    Melanoma Father    Hypertension Sister    Sleep apnea Sister    Healthy Brother     Hypertension Brother    Healthy Brother    Hypertension Brother    Heart attack Maternal Aunt    Breast cancer Maternal Aunt        dx over 50   Heart attack Maternal Uncle    Prostate cancer Maternal Uncle        unsure if this was truly prostate cancer   Heart attack Paternal Uncle    Heart attack Maternal Grandfather    Breast cancer Cousin        mat first cousin   Breast cancer Cousin        mat first cousin   Cancer Cousin        father's maternal first cousin with Breast, Ovarian, Uterine Cancer   Leukemia Other        father's maternal uncle   Leukemia Other        father's maternal uncle   Leukemia Cousin        3 of father's maternal first cousin   Social History   Socioeconomic History   Marital status: Widowed    Spouse name: Jillyn Hidden   Number of children: 3   Years of education: Not on file   Highest education level: Not on file  Occupational History    Comment: Retired  Tobacco Use   Smoking status: Never    Passive exposure: Never   Smokeless tobacco: Never  Vaping Use   Vaping status: Never Used  Substance and Sexual Activity   Alcohol use: No    Alcohol/week: 0.0 standard drinks of alcohol   Drug use: No   Sexual activity: Never    Birth control/protection: Post-menopausal  Other Topics Concern   Not on file  Social History Narrative   Epworth Sleepiness Scale Score: 10       Current Social History 08/15/2017        Who lives at home: Patient lives alone in one level home 08/15/2017       Transportation: Patient has own vehicle  08/15/2017   Important Relationships: Fiance, 3 sons, 10 grandkids, mother, 2 brothers, 1 sister and their families and many friends 08/15/2017    Pets: None 08/15/2017   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 Ducatte, RN, BSN       Lives with middle son, Barbara Cower. Has a dog Rascal that is sick - might have to put him down                                                                                                 Social Drivers of Corporate investment banker Strain: Low Risk  (05/03/2023)   Overall Financial Resource Strain (CARDIA)    Difficulty of Paying Living Expenses: Not hard at all  Food Insecurity: No Food Insecurity (05/03/2023)   Hunger Vital Sign    Worried About Running Out of Food in the Last Year: Never true    Ran Out of Food in the Last Year: Never true  Transportation Needs: No Transportation Needs (05/03/2023)   PRAPARE - Administrator, Civil Service (Medical): No    Lack of Transportation (Non-Medical): No  Physical Activity: Inactive (03/19/2023)   Exercise Vital Sign    Days of Exercise per Week: 0 days    Minutes of Exercise per Session: 0 min  Stress: No Stress Concern Present (03/19/2023)   [employer] of Occupational Health - Occupational Stress Questionnaire    Feeling of Stress : Not at all  Social Connections: Moderately Integrated (03/19/2023)   Social Connection and Isolation Panel [NHANES]    Frequency of Communication with Friends and Family: Twice a week    Frequency of Social Gatherings with Friends and Family: Once a week    Attends Religious Services: More than 4 times per year    Active Member of [employer] or Organizations: Yes    Attends Banker Meetings: Never    Marital Status: Widowed     FALLS in last five office visits:     03/12/2024    1:21 PM 02/25/2024    1:23 PM 01/15/2024    9:49 AM 01/10/2024   11:29 AM 09/04/2023   10:32 AM  Fall Risk   Falls in the past year? 0 0 0 0 0  Number falls in past yr: 0 0 0 0 0  Injury with Fall? 0 0 0 0 0    Health Maintenance reviewed: Immunization History   Administered Date(s) Administered   Influenza Split 09/20/2011   Influenza Whole 09/12/2007, 10/14/2009   Influenza, High Dose Seasonal PF 08/01/2019   Influenza,inj,Quad PF,6+ Mos 09/11/2013, 10/09/2013, 12/10/2014, 08/09/2016, 08/15/2017   Influenza-Unspecified 09/16/2018   Moderna Covid-19 Vaccine Bivalent Booster 68yrs & up 10/03/2021   Moderna Sars-Covid-2 Vaccination 10/11/2020, 04/02/2021   Pfizer(Comirnaty)Fall Seasonal Vaccine 12 years and older 04/23/2023   Pneumococcal Conjugate-13 06/21/2016   Pneumococcal Polysaccharide-23 07/03/2002, 12/10/2014   Td 12/03/1998   Tdap 11/23/2011, 10/09/2013   Zoster Recombinant(Shingrix) 04/02/2021   Zoster, Live 11/08/2011   Health Maintenance Topics with due status: Overdue     Topic Date Due  Zoster Vaccines- Shingrix 05/28/2021   Pneumonia Vaccine 73+ Years old 06/21/2021   Colonoscopy 04/30/2022   DTaP/Tdap/Td 10/10/2023   Health Maintenance Topics with due status: Due Soon     Topic Date Due   Medicare Annual Wellness (AWV) 03/18/2024    Vital Signs Weight: 202 lb 4 oz (91.7 kg) Body mass index is 35.39 kg/m. CrCl cannot be calculated (Patient's most recent lab result is older than the maximum 21 days allowed.). Body surface area is 2.03 meters squared. Vitals:   03/12/24 1323 03/12/24 1453  BP: (!) 168/94 (!) 160/96  Pulse: 63   SpO2: 100%   Weight: 202 lb 4 oz (91.7 kg)   Height: 5' 3.39" (1.61 m)    Wt Readings from Last 3 Encounters:  03/12/24 202 lb 4 oz (91.7 kg)  02/25/24 198 lb (89.8 kg)  01/15/24 197 lb 12.8 oz (89.7 kg)   Hearing Screening   250Hz  500Hz  1000Hz  2000Hz  4000Hz   Right ear 20 20 20 20 20   Left ear 20 20 20 20 20    Vision Screening   Right eye Left eye Both eyes  Without correction 20/20 20/30 20/20   With correction       Physical Examination:  VS reviewed Physical Exam  Vitals:   03/12/24 1323 03/12/24 1453  BP: (!) 168/94 (!) 160/96  Pulse: 63   SpO2: 100%     HEENT:  Bilateral EAC adequately patent, I.e., able to see TMs General physical exam: well-dressed and groomed. Pleasant and cooperative. Cardiac: Regular rate and rhythm without murmur. No carotid bruits Lungs: Clear to auscultation.   No other insights were derived from the general Exam   Parkinsonism Yes []  No []   Rigidity Ab nl ? Yes []  No []   Cranial Nerve Abnl ? Yes []  No []   Vestibulo-ocular reflex (HINTS)  Abnl ? Yes []  No []   Smooth Pursuit Abnl Yes []  No []   Sensory Abnl ? Yes []  No []   Gait Abnl ? []  slow, []  petit ped, []  heel past toe no present, []  forward stoop present, []  en bloc turn present, []  symmetric stance phase, []  leg pasts midline present []  push back postural Stability Abnl Reflexes Abnl ? Yes []  No []  Very brisk in LE  Babinski Abnl Yes []  No []   Motor Abnl ? Yes []  No []   Tremor ? Yes []  No []     Timed Up & Go Test: 12 seconds Sit-to-Stand Test: 10 / 30-seconds 4-Stage Stand Test:  Feet Side-by-side: Yes.       Feet Semi-tandem: Yes.         Mini-Mental State Examination or Montreal Cognitive Assessment:  Patient did  require additional cues or prompts to complete tasks. Patient was cooperative and attentive to testing tasks Patient did  appear motivated to perform well       03/19/2023   11:02 AM  6CIT Screen  What Year? 0 points  What month? 0 points  What time? 0 points  Count back from 20 0 points  Months in reverse 0 points  Repeat phrase 0 points  Total Score 0 points        No data to display                  No data to display           Labs No components found for: "VITAMIND"  Lab Results  Component Value Date   VITAMINB12 >2000 (H) 01/10/2024    Lab Results  Component Value Date   FOLATE 6.0 01/10/2024    Lab Results  Component Value Date   TSH 0.698 01/10/2024    No results found for: "RPR"  Lab Results  Component Value Date   HIV Non Reactive 01/10/2024      Chemistry      Component Value Date/Time    NA 139 01/10/2024 1206   K 4.6 01/10/2024 1206   CL 105 01/10/2024 1206   CO2 20 01/10/2024 1206   BUN 20 01/10/2024 1206   CREATININE 1.00 01/10/2024 1206   CREATININE 0.98 05/24/2016 1105      Component Value Date/Time   CALCIUM 11.2 (H) 01/10/2024 1206   ALKPHOS 105 05/01/2023 0837   AST 23 05/01/2023 0837   ALT 12 05/01/2023 0837   BILITOT 0.8 05/01/2023 0837   BILITOT 0.3 12/30/2019 1457       CrCl cannot be calculated (Patient's most recent lab result is older than the maximum 21 days allowed.).   Lab Results  Component Value Date   HGBA1C 5.6 01/10/2024     @10RELATIVEDAYS @ Hearing Screening   250Hz  500Hz  1000Hz  2000Hz  4000Hz   Right ear 20 20 20 20 20   Left ear 20 20 20 20 20    Vision Screening   Right eye Left eye Both eyes  Without correction 20/20 20/30 20/20   With correction      Lab Results  Component Value Date   WBC 6.3 01/10/2024   HGB 11.1 01/10/2024   HCT 35.7 01/10/2024   MCV 88 01/10/2024   PLT 246 01/10/2024    No results found for this or any previous visit (from the past 24 hours).  Imaging   Brain MRI: no acute disease, mild to moderate parenchymal loss      ------------------------------------------------------------------------------------------------------------------------------------------------------------------------------------------------------------------------------------------------------------------------------------------------------------------------------------------------------------------------------------------------------------------------------------------------------------------------------------------------------------  Assessment and Plan: Please see individual consultation notes from physical therapy, pharmacy and social work for today.    Problem List Items Addressed This Visit       High   Word finding difficulty   Thoracic aortic aneurysm (HCC)     Medium    Hypercalcemia     Unprioritized    Mild dementia (HCC) - Primary   Relevant Orders   AMB Referral VBCI Care Management   Other Visit Diagnoses       High risk medication use          No problem-specific Assessment & Plan notes found for this encounter.   Assessment and Plan Cognitive Decline Progressive word-finding difficulties, forgetfulness, and occasional disorientation. Family history of dementia. Cognitive testing performed. Interest in cognitive maintenance activities noted. - Consider neurology referral if decline progresses.  Chronic Pain Chronic back, hip, and generalized pain limiting mobility. History of back surgery. Severe pain managed with Tylenol. - Consider referral to physical therapy for pain management and mobility improvement.  Atrial Fibrillation On Eliquis, but cost is a concern. Prefers anticoagulation without frequent blood tests. - Consult pharmacy for cost-effective anticoagulation alternatives.  General Health Maintenance Acknowledged need for dietary improvement and weight management. Limited physical activity due to pain. - Encourage balanced diet and weight management. - Promote increased physical activity within pain tolerance.  Follow-up Ongoing concerns about cognitive decline, pain management, and medication costs. - Schedule follow-up to reassess cognitive function and pain management. - Follow up with pharmacy regarding anticoagulation options.   Primary Contact: Extended Emergency Contact Information Primary Emergency Contact: Nicolasa Ducking [place] Home Phone: [phone] Relation: Son    CPT E&M Office Visit Time Before  Visit; reviewing medical records (e.g. recent visits, labs, studies): 20 minutes During Visit (F2F time): 60 minutes After Visit (discussion with family or HCP, prescribing, ordering, referring, calling result/recommendations or documenting on same day): 10 minutes Total Visit Time: 90 minutes  Level 2 Est: 10-19 min     New:  15-29 min Level 3 Est: 20-29 min     New: 30-44 min Level 4 Est: 30-39 min     New: 45-59 min Level 5 Est: 40-54 min     New: 60-74 min   The interdisciplinary team consisted of representatives from medicine, pharmacy.  Referral to [facility] for SDOH review and education about resources was made.

## 2024-03-13 ENCOUNTER — Other Ambulatory Visit (HOSPITAL_COMMUNITY): Payer: Self-pay

## 2024-03-13 ENCOUNTER — Encounter: Admitting: Speech Pathology

## 2024-03-13 ENCOUNTER — Telehealth: Payer: Self-pay | Admitting: *Deleted

## 2024-03-13 ENCOUNTER — Ambulatory Visit: Admitting: Physical Therapy

## 2024-03-13 ENCOUNTER — Encounter: Payer: Self-pay | Admitting: Family Medicine

## 2024-03-13 DIAGNOSIS — R7989 Other specified abnormal findings of blood chemistry: Secondary | ICD-10-CM | POA: Insufficient documentation

## 2024-03-13 DIAGNOSIS — Z955 Presence of coronary angioplasty implant and graft: Secondary | ICD-10-CM

## 2024-03-13 HISTORY — DX: Presence of coronary angioplasty implant and graft: Z95.5

## 2024-03-13 NOTE — Patient Instructions (Addendum)
  Thank you again for coming into the [facility] on 03/12/24.  It was our opinion that you have mild dementia that may interfere with your memory and knowing where you are at all times.   Thinking and memory in dementia often slowly increases become more impaired over time.  It is a good idea to choose someone to manage your money and property, if you were not able.  A document called a Durable Power of Attorney lets the banks and courts know who you would want managing your money and property.  Lawyers who do [employer] or [employer] can help you make a Engineer, agricultural.   It is also a good idea to choose someone ton make decisions about your health care, if you were not able.  A document called a Health Care Power of Attorney can let your doctors and nurses know who you want making decisions about your health.   You make choose the same person for both types of Powers of Attorney or different ones.  While lawyers can also help you with making a Health Care Power of Attorney, you may also ask your doctor's office to help.    Part of keeping your brain healthy and your memory strong is being around and talking with other people.  A Child psychotherapist from [employer] health will contact you about senior centers near [place] you may want to attend.    You amy want to speak with the folks at [facility] in [place].  They have organized activities everyday where people get together for meals, talk and activities.  Their telephone number is [phone].   Give them a call.Marland KitchenMarland Kitchen

## 2024-03-13 NOTE — Progress Notes (Signed)
 Complex Care Management Note Care Guide Note  03/13/2024 Name: Christina Hardin MRN: [id number] DOB: 09-05-1951   Complex Care Management Outreach Attempts: An unsuccessful telephone outreach was attempted today to offer the patient information about available complex care management services.  Follow Up Plan:  Additional outreach attempts will be made to offer the patient complex care management information and services.   Encounter Outcome:  No Answer  Gwenevere Ghazi  [facility]  [facility], [facility] Guide  Direct Dial: [phone]  Fax [phone]

## 2024-03-13 NOTE — Assessment & Plan Note (Signed)
 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 Her degree of dementia is mild with preservation of most iADLs. There are not behavioral and psychological symptoms of dementia manifest - she does have pre-existing depression and anxiety, but they do no seem to have worsened.   We discussed role of exercise, mediterranean diet and social activity in slowing memory decline.   A referral was sent to [employer] to help patient access information about senior centers in [place] area.  We also talked about Christina Hardin contacting the [facility] center to learn about the program and the availability of scholarships.   Financial, legal and medical advanced planning recommended to patient.  Recommend reviewing her iADLs every 6 to 12 months to look for evidence of progression.

## 2024-03-13 NOTE — Assessment & Plan Note (Signed)
 B12 serum > 2000 High Recommend Ms Hersh decrease Vitamin B12 2500 microgram daily to 1000 microgram dose daily

## 2024-03-16 ENCOUNTER — Encounter

## 2024-03-16 NOTE — Addendum Note (Signed)
 Addended byArita Belch, Keiri Solano D on: 03/16/2024 11:23 AM   Modules accepted: Orders

## 2024-03-16 NOTE — Progress Notes (Unsigned)
 Complex Care Management Note Care Guide Note  03/16/2024 Name: RITHIKA SEEL MRN: [id number] DOB: [birth year]   Complex Care Management Outreach Attempts: A second unsuccessful outreach was attempted today to offer the patient with information about available complex care management services.  Follow Up Plan:  Additional outreach attempts will be made to offer the patient complex care management information and services.   Encounter Outcome:  No Answer  Barnie Bora  [facility]  [facility], [facility] Guide  Direct Dial: [phone]  Fax [phone]

## 2024-03-18 ENCOUNTER — Ambulatory Visit: Admitting: Physical Therapy

## 2024-03-18 ENCOUNTER — Encounter

## 2024-03-19 NOTE — Progress Notes (Signed)
 Complex Care Management Note  Care Guide Note 03/19/2024 Name: Christina Hardin MRN: [id number] DOB: Nov 12, 1951  Christina Hardin is a 73 y.o. year old female who sees Clem Currier, DO for primary care. I reached out to Alexandra Ice by phone today to offer complex care management services.  [name] was given information about Complex Care Management services today including:   The Complex Care Management services include support from the care team which includes your Nurse Care Manager, Clinical Social Worker, or Pharmacist.  The Complex Care Management team is here to help remove barriers to the health concerns and goals most important to you. Complex Care Management services are voluntary, and the patient may decline or stop services at any time by request to their care team member.   Complex Care Management Consent Status: Patient did not agree to participate in complex care management services at this time.  Follow up plan None   Encounter Outcome:  Patient Refused  Barnie Bora  [facility]  [facility], [facility] Guide  Direct Dial: [phone]  Fax [phone]

## 2024-03-19 NOTE — Progress Notes (Signed)
 Complex Care Management Note Care Guide Note  03/19/2024 Name: Christina Hardin MRN: [id number] DOB: 06-06-1951   Complex Care Management Outreach Attempts: An unsuccessful telephone outreach was attempted today to offer the patient information about available complex care management services.  Follow Up Plan:  Additional outreach attempts will be made to offer the patient complex care management information and services.   Encounter Outcome:  No Answer  Patient returned call 4/16 and left message to call back.   Barnie Bora  [facility]  [facility], [facility] Guide  Direct Dial: [phone]  Fax [phone]

## 2024-03-23 ENCOUNTER — Ambulatory Visit: Payer: Medicare HMO

## 2024-03-23 ENCOUNTER — Telehealth: Payer: Self-pay

## 2024-03-23 VITALS — Ht 63.39 in | Wt 202.0 lb

## 2024-03-23 DIAGNOSIS — Z0000 Encounter for general adult medical examination without abnormal findings: Secondary | ICD-10-CM

## 2024-03-23 NOTE — Patient Instructions (Addendum)
 Christina Hardin , Thank you for taking time to come for your Medicare Wellness Visit. I appreciate your ongoing commitment to your health goals. Please review the following plan we discussed and let me know if I can assist you in the future.   Referrals/Orders/Follow-Ups/Clinician Recommendations: Keep maintaining your health by keeping your appointments with Dr.  Clem Currier and any specialists that you may see.  Call us  if you need anything.  Have a great year!!!!  This is a list of the screening recommended for you and due dates:  Health Maintenance  Topic Date Due   Zoster (Shingles) Vaccine (2 of 2) 05/28/2021   Pneumonia Vaccine (3 of 3 - PCV20 or PCV21) 06/21/2021   Colon Cancer Screening  04/30/2022   DTaP/Tdap/Td vaccine (4 - Td or Tdap) 10/10/2023   Medicare Annual Wellness Visit  03/18/2024   COVID-19 Vaccine (5 - 2024-25 season) 03/28/2024*   Flu Shot  07/03/2024   Mammogram  02/24/2025   DEXA scan (bone density measurement)  Completed   Hepatitis C Screening  Completed   HPV Vaccine  Aged Out   Meningitis B Vaccine  Aged Out  *Topic was postponed. The date shown is not the original due date.    Advanced directives: (Declined) Advance directive discussed with you today. Even though you declined this today, please call our office should you change your mind, and we can give you the proper paperwork for you to fill out. Paperwork is pending per patient.  Next Medicare Annual Wellness Visit scheduled for next year: Yes, It was nice speaking with you today! Your next Annual Wellness Visit is scheduled for 03/25/2025 at 10:30 a.m. via PHONE VISIT. If you need to reschedule or cancel, please call [phone].

## 2024-03-23 NOTE — Progress Notes (Signed)
 Because this visit was a virtual/telehealth visit,  certain criteria was not obtained, such a blood pressure, CBG if applicable, and timed get up and go. Any medications not marked as "taking" were not mentioned during the medication reconciliation part of the visit. Any vitals not documented were not able to be obtained due to this being a telehealth visit or patient was unable to self-report a recent blood pressure reading due to a lack of equipment at home via telehealth. Vitals that have been documented are verbally provided by the patient.   Subjective:   Christina Hardin is a 73 y.o. who presents for a Medicare Wellness preventive visit.  Visit Complete: Virtual I connected with  Christina Hardin on 03/23/24 by a audio enabled telemedicine application and verified that I am speaking with the correct person using two identifiers.  Patient Location: Home  Provider Location: Office/Clinic  I discussed the limitations of evaluation and management by telemedicine. The patient expressed understanding and agreed to proceed.  Vital Signs: Because this visit was a virtual/telehealth visit, some criteria may be missing or patient reported. Any vitals not documented were not able to be obtained and vitals that have been documented are patient reported.  VideoDeclined- This patient declined Librarian, academic. Therefore the visit was completed with audio only.  Persons Participating in Visit: Patient.  AWV Questionnaire: No: Patient Medicare AWV questionnaire was not completed prior to this visit.  Cardiac Risk Factors include: advanced age (>102men, >78 women);sedentary lifestyle;family history of premature cardiovascular disease;hypertension;obesity (BMI >30kg/m2)     Objective:    Today's Vitals   03/23/24 1033 03/23/24 1034  Weight: 202 lb (91.6 kg)   Height: 5' 3.39" (1.61 m)   PainSc: 3  3   PainLoc: Generalized    Body mass index is 35.34 kg/m.      03/23/2024   10:37 AM 03/12/2024    1:21 PM 02/25/2024    1:24 PM 02/11/2024    1:28 PM 01/15/2024    9:44 AM 09/04/2023   10:30 AM 05/06/2023    3:26 PM  Advanced Directives  Does Patient Have a Medical Advance Directive? No No No No No No No  Would patient like information on creating a medical advance directive? No - Patient declined No - Patient declined No - Patient declined  No - Patient declined No - Patient declined No - Patient declined    Current Medications (verified) Outpatient Encounter Medications as of 03/23/2024  Medication Sig   acetaminophen  (TYLENOL ) 325 MG tablet Take 325-650 mg by mouth every 6 (six) hours as needed.   Cyanocobalamin 2500 MCG CHEW Chew 2,500 mcg by mouth daily. Vitamin B12   gabapentin  (NEURONTIN ) 300 MG capsule TAKE 2 CAPSULES BY MOUTH 3 TIMES DAILY.   losartan  (COZAAR ) 100 MG tablet Take 1 tablet (100 mg total) by mouth daily.   metoprolol  succinate (TOPROL -XL) 25 MG 24 hr tablet Take 1 tablet (25 mg total) by mouth daily.   rivaroxaban  (XARELTO ) 20 MG TABS tablet Take 1 tablet (20 mg total) by mouth daily with supper. With a meal   rosuvastatin  (CRESTOR ) 20 MG tablet Take 1 tablet (20 mg total) by mouth daily.   No facility-administered encounter medications on file as of 03/23/2024.    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   Diastolic dysfunction without heart failure 07/06/2016   Diverticulosis of colon 01/30/2007   Qualifier: Diagnosis of   By: Rosanne Commodore MD, Garald Jumbo      Replacing diagnoses that were inactivated after the 03/04/23 regulatory import     Ductal carcinoma in situ (DCIS) of left breast    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   Family history of breast cancer    Gastric bypass status for obesity    History of breast cancer 11/16/2016   NEPHROLITHIASIS 09/17/2008   Qualifier: Diagnosis of   By: Bronson Canny MD, Sammie Crigler         Personal history of chemotherapy 2017   Recurrent UTI 03/10/2024   Reflux esophagitis 01/30/2007   Qualifier: Diagnosis of   By: Rosanne Commodore MD, Garald Jumbo        Renal cyst, right 09/17/2008   Qualifier: Diagnosis of   By: Bronson Canny MD, Sammie Crigler         Rosacea 01/30/2007   Qualifier: Diagnosis of   By: Rosanne Commodore MD, Garald Jumbo        S/P drug eluting coronary stent placement 03/13/2024   [place] CCTA 10/05/2021: Mild to moderate (50-60% diameter stenosis), single discreet stenosis of distal RCA. Minimal luminal narrowing of the LAD arteryCardiac catheterization 10/30/2021: DES to RCA, EF 60%, LVEDP 10 mmHg     Thoracic aortic aneurysm [facility])    Thoracic aortic aneurysm (HCC)    Past Surgical History:  Procedure Laterality Date   ABDOMINAL HYSTERECTOMY     APPENDECTOMY  1958   BLADDER REPAIR     BREAST BIOPSY Left    BREAST LUMPECTOMY WITH RADIOACTIVE SEED AND SENTINEL LYMPH NODE BIOPSY Left 10/09/2016   Procedure: BREAST LUMPECTOMY WITH RADIOACTIVE SEED AND SENTINEL LYMPH NODE BIOPSY;  Surgeon: Dareen Ebbing, MD;  Location: [facility];  Service: General;  Laterality: Left;   CARPAL TUNNEL RELEASE Left 08/22/2017   Procedure: LEFT CARPAL TUNNEL RELEASE;  Surgeon: Lyanne Sample, MD;  Location: [facility];  Service: Orthopedics;  Laterality: Left;   CARPOMETACARPEL SUSPENSION PLASTY Left 08/22/2017   Procedure: SUSPENSIONPLASTY LEFT THUMB TRAPENZIUM EXCISION ABDUCTOR POLLICIS LONGUS;  Surgeon: Lyanne Sample, MD;  Location: [facility];  Service: Orthopedics;  Laterality: Left;   COLONOSCOPY W/  POLYPECTOMY     GASTRIC BYPASS     HYSTERECTOMY ABDOMINAL WITH SALPINGECTOMY     ULNAR NERVE TRANSPOSITION Left 08/22/2017   Procedure: ULNAR NERVE DECOMPRESSION;  Surgeon: Lyanne Sample, MD;  Location: [facility];  Service: Orthopedics;  Laterality: Left;   Family History  Problem Relation Age of Onset   Atrial fibrillation Mother    Dementia Mother    Hypertension Mother    Gout Mother    Arthritis Mother    Stroke Mother    COPD Father    Emphysema Father    Lung cancer Father    Melanoma Father    Hypertension Sister    Sleep apnea Sister    Healthy Brother    Hypertension Brother    Healthy Brother    Hypertension Brother    Heart attack Maternal Aunt    Breast cancer Maternal Aunt  dx over 50   Heart attack Maternal Uncle    Prostate cancer Maternal Uncle        unsure if this was truly prostate cancer   Heart attack Paternal Uncle    Heart attack Maternal Grandfather    Breast cancer Cousin        mat first cousin   Breast cancer Cousin        mat first cousin   Cancer Cousin        father's maternal first cousin with Breast, Ovarian, Uterine Cancer   Leukemia Other        father's maternal uncle   Leukemia Other        father's maternal uncle   Leukemia Cousin        3 of father's maternal first cousin   Social History   Socioeconomic History   Marital status: Widowed    Spouse name: Ambrosio Junker   Number of children: 3   Years of education: Not on file   Highest education level: Not on file  Occupational History    Comment: Retired  Tobacco Use   Smoking status: Never    Passive exposure: Never   Smokeless tobacco: Never  Vaping Use   Vaping status: Never Used  Substance and Sexual Activity   Alcohol use: No    Alcohol/week: 0.0 standard drinks of alcohol   Drug use: No   Sexual activity: Never    Birth control/protection: Post-menopausal  Other Topics Concern   Not on file  Social History Narrative   Epworth Sleepiness Scale  Score: 10       Current Social History 08/15/2017        Who lives at home: Patient lives alone in one level home 08/15/2017       Transportation: Patient has own vehicle  08/15/2017   Important Relationships: Fiance, 3 sons, 10 grandkids, mother, 2 brothers, 1 sister and their families and many friends 08/15/2017    Pets: None 08/15/2017   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 Ducatte, RN, BSN       Lives with middle son, Reymundo Caulk. Has a dog Rascal that is sick - might have to put him down                                                                                                 Social Drivers of Corporate investment banker Strain: Low Risk  (03/23/2024)   Overall Financial Resource Strain (CARDIA)    Difficulty of Paying Living Expenses: Not hard at all  Food Insecurity: No Food Insecurity (03/23/2024)   Hunger Vital Sign    Worried About Running Out of Food in the Last Year: Never true    Ran Out of Food in the  Last Year: Never true  Transportation Needs: No Transportation Needs (03/23/2024)   PRAPARE - Administrator, Civil Service (Medical): No    Lack of Transportation (Non-Medical): No  Physical Activity: Inactive (03/23/2024)   Exercise Vital Sign    Days of Exercise per Week: 0 days    Minutes of Exercise per Session: 0 min  Stress: No Stress Concern Present (03/23/2024)   [employer] of Occupational Health - Occupational Stress Questionnaire    Feeling of Stress : Not at all  Social Connections: Moderately Integrated (03/23/2024)   Social Connection and Isolation Panel [NHANES]    Frequency of Communication with Friends and  Family: Twice a week    Frequency of Social Gatherings with Friends and Family: Once a week    Attends Religious Services: More than 4 times per year    Active Member of [employer] or Organizations: Yes    Attends Banker Meetings: Never    Marital Status: Widowed    Tobacco Counseling Counseling given: Not Answered    Clinical Intake:  Pre-visit preparation completed: Yes  Pain : 0-10 Pain Score: 3  Pain Type: Chronic pain Pain Location: Generalized     BMI - recorded: 35.34 Nutritional Status: BMI > 30  Obese Nutritional Risks: None Diabetes: No  Lab Results  Component Value Date   HGBA1C 5.6 01/10/2024   HGBA1C 5.4 06/03/2023   HGBA1C 5.6 09/11/2013     How often do you need to have someone help you when you read instructions, pamphlets, or other written materials from your doctor or pharmacy?: 1 - Never What is the last grade level you completed in school?: TECHNICAL COLLEGE  Interpreter Needed?: No  Information entered by :: Druscilla Gerhard, LPN.   Activities of Daily Living     03/23/2024   10:39 AM  In your present state of health, do you have any difficulty performing the following activities:  Hearing? 0  Vision? 0  Difficulty concentrating or making decisions? 1  Comment Patient has mild dementia  Walking or climbing stairs? 0  Dressing or bathing? 0  Doing errands, shopping? 0  Preparing Food and eating ? Y  Using the Toilet? N  In the past six months, have you accidently leaked urine? N  Do you have problems with loss of bowel control? N  Managing your Medications? N  Managing your Finances? N  Housekeeping or managing your Housekeeping? N    Patient Care Team: Clem Currier, DO as PCP - General (Family Medicine) Maximo Spar Aviva Lemmings, MD as PCP - Cardiology (Cardiology) Cameron Cea, MD as Consulting Physician (Hematology and Oncology) Alger Infield, MD as Consulting Physician (Plastic Surgery) Johna Myers, MD as Consulting  Physician (Radiation Oncology) Debbie Fails, Laura Polio, NP as Nurse Practitioner (Hematology and Oncology) Dareen Ebbing, MD as Consulting Physician (General Surgery) Maximo Spar Aviva Lemmings, MD as Consulting Physician (Cardiology) Dorthey Gave, PA-C as Physician Assistant (Dermatology)  Indicate any recent Medical Services you may have received from other than [facility] providers in the past year (date may be approximate).     Assessment:   This is a routine wellness examination for Hana.  Hearing/Vision screen Hearing Screening - Comments:: Denies hearing difficulties.  Vision Screening - Comments:: Wears otc reading glasses - not up to date with routine eye exams.    Goals Addressed             This Visit's Progress    Client understands the importance of follow-up with providers by  attending scheduled visits.         Depression Screen     03/23/2024   10:45 AM 03/12/2024    1:21 PM 02/25/2024    1:28 PM 01/15/2024    9:49 AM 01/10/2024   11:59 AM 09/04/2023   10:33 AM 06/03/2023    1:38 PM  PHQ 2/9 Scores  PHQ - 2 Score 0  1 0 0 2 0  PHQ- 9 Score 5  6 3 6 7 5   Exception Documentation  Patient refusal         Fall Risk     03/23/2024   10:37 AM 03/12/2024    1:21 PM 02/25/2024    1:23 PM 01/15/2024    9:49 AM 01/10/2024   11:29 AM  Fall Risk   Falls in the past year? 0 0 0 0 0  Number falls in past yr: 0 0 0 0 0  Injury with Fall? 0 0 0 0 0  Risk for fall due to : No Fall Risks      Follow up Falls prevention discussed;Falls evaluation completed        MEDICARE RISK AT HOME:  Medicare Risk at Home Any stairs in or around the home?: No (2 steps on deck, no railings) If so, are there any without handrails?: Yes Home free of loose throw rugs in walkways, pet beds, electrical cords, etc?: Yes Adequate lighting in your home to reduce risk of falls?: Yes Life alert?: No Use of a cane, walker or w/c?: No Grab bars in the bathroom?: Yes Shower chair or bench in shower?:  Yes Elevated toilet seat or a handicapped toilet?: No  TIMED UP AND GO:  Was the test performed?  No  Cognitive Function: Patient has current diagnosis of cognitive impairment. Patient is unable to complete screening 6CIT or MMSE.      03/23/2024   10:43 AM  MMSE - Mini Mental State Exam  Not completed: Unable to complete      03/13/2024    8:25 AM  Montreal Cognitive Assessment   Visuospatial/ Executive (0/5) 4  Naming (0/3) 3  Attention: Read list of digits (0/2) 2  Attention: Read list of letters (0/1) 0  Attention: Serial 7 subtraction starting at 100 (0/3) 1  Language: Repeat phrase (0/2) 0  Language : Fluency (0/1) 0  Abstraction (0/2) 2  Delayed Recall (0/5) 0  Orientation (0/6) 6  Total 18  Adjusted Score (based on education) 18      03/23/2024   10:38 AM 03/19/2023   11:02 AM  6CIT Screen  What Year? 0 points 0 points  What month? 0 points 0 points  What time? 0 points 0 points  Count back from 20 0 points 0 points  Months in reverse 0 points 0 points  Repeat phrase 0 points 0 points  Total Score 0 points 0 points    Immunizations Immunization History  Administered Date(s) Administered   Influenza Split 09/20/2011   Influenza Whole 09/12/2007, 10/14/2009   Influenza, High Dose Seasonal PF 08/01/2019   Influenza,inj,Quad PF,6+ Mos 09/11/2013, 10/09/2013, 12/10/2014, 08/09/2016, 08/15/2017   Influenza-Unspecified 09/16/2018   Moderna Covid-19 Vaccine Bivalent Booster 66yrs & up 10/03/2021   Moderna Sars-Covid-2 Vaccination 10/11/2020, 04/02/2021   Pfizer(Comirnaty)Fall Seasonal Vaccine 12 years and older 04/23/2023   Pneumococcal Conjugate-13 06/21/2016   Pneumococcal Polysaccharide-23 07/03/2002, 12/10/2014   Td 12/03/1998   Tdap 11/23/2011, 10/09/2013   Zoster Recombinant(Shingrix) 04/02/2021   Zoster, Live 11/08/2011    Screening Tests  Health Maintenance  Topic Date Due   Zoster Vaccines- Shingrix (2 of 2) 05/28/2021   Pneumonia Vaccine 42+  Years old (3 of 3 - PCV20 or PCV21) 06/21/2021   Colonoscopy  04/30/2022   DTaP/Tdap/Td (4 - Td or Tdap) 10/10/2023   Medicare Annual Wellness (AWV)  03/18/2024   COVID-19 Vaccine (5 - 2024-25 season) 03/28/2024 (Originally 08/04/2023)   INFLUENZA VACCINE  07/03/2024   MAMMOGRAM  02/24/2025   DEXA SCAN  Completed   Hepatitis C Screening  Completed   HPV VACCINES  Aged Out   Meningococcal B Vaccine  Aged Out    Health Maintenance  Health Maintenance Due  Topic Date Due   Zoster Vaccines- Shingrix (2 of 2) 05/28/2021   Pneumonia Vaccine 28+ Years old (3 of 3 - PCV20 or PCV21) 06/21/2021   Colonoscopy  04/30/2022   DTaP/Tdap/Td (4 - Td or Tdap) 10/10/2023   Medicare Annual Wellness (AWV)  03/18/2024   Health Maintenance Items Addressed: Yes  Additional Screening:  Vision Screening: Recommended annual ophthalmology exams for early detection of glaucoma and other disorders of the eye.  Dental Screening: Recommended annual dental exams for proper oral hygiene  Community Resource Referral / Chronic Care Management: CRR required this visit?  No   CCM required this visit?  No     Plan:     I have personally reviewed and noted the following in the patient's chart:   Medical and social history Use of alcohol, tobacco or illicit drugs  Current medications and supplements including opioid prescriptions. Patient is not currently taking opioid prescriptions. Functional ability and status Nutritional status Physical activity Advanced directives List of other physicians Hospitalizations, surgeries, and ER visits in previous 12 months Vitals Screenings to include cognitive, depression, and falls Referrals and appointments  In addition, I have reviewed and discussed with patient certain preventive protocols, quality metrics, and best practice recommendations. A written personalized care plan for preventive services as well as general preventive health recommendations were provided  to patient.     Margette Sheldon, LPN   7/82/9562   After Visit Summary: (MyChart) Due to this being a telephonic visit, the after visit summary with patients personalized plan was offered to patient via MyChart   Notes: Please refer to Routing Comments.

## 2024-03-23 NOTE — Telephone Encounter (Signed)
 Called patient. Patient was previously scheduled for next Tuesday, 4/29 for [facility] follow up.   Offered patient appointment for tomorrow. Patient reports that shortness of breath is mainly when she is getting into her bed, as bed is slightly elevated. She denies shortness of breath or chest pain at rest.   ED precautions discussed.   Elsie Halo, RN

## 2024-03-23 NOTE — Telephone Encounter (Signed)
 Patient recently saw Dr. McDiarmid regarding memory decline.  Patient stated that she forgot to mention she has been having SOB every day and needs an order for rollator walker.  During AWV patient was having a hard time with finding her words so CRIT was not completed since she stated that Dr. McDiarmid did a cognitive test 03/12/2024.  Patient would like to have Dr. Annabell Key or her nurse contact her regarding those issues.  Miryam Mcelhinney N. Glenna Lango, LPN [facility] Annual Wellness Team Direct Dial: [phone]

## 2024-03-24 ENCOUNTER — Encounter

## 2024-03-24 ENCOUNTER — Encounter: Payer: Self-pay | Admitting: Pharmacist

## 2024-03-24 ENCOUNTER — Encounter: Payer: Self-pay | Admitting: Student

## 2024-03-24 ENCOUNTER — Ambulatory Visit: Admitting: Student

## 2024-03-24 ENCOUNTER — Ambulatory Visit: Admitting: Physical Therapy

## 2024-03-24 ENCOUNTER — Ambulatory Visit: Admitting: Pharmacist

## 2024-03-24 VITALS — BP 140/102 | HR 93 | Ht 64.0 in | Wt 197.8 lb

## 2024-03-24 DIAGNOSIS — I2510 Atherosclerotic heart disease of native coronary artery without angina pectoris: Secondary | ICD-10-CM | POA: Diagnosis not present

## 2024-03-24 DIAGNOSIS — R0602 Shortness of breath: Secondary | ICD-10-CM | POA: Insufficient documentation

## 2024-03-24 DIAGNOSIS — R0609 Other forms of dyspnea: Secondary | ICD-10-CM | POA: Diagnosis not present

## 2024-03-24 MED ORDER — ROSUVASTATIN CALCIUM 20 MG PO TABS: 20.0000 mg | ORAL_TABLET | Freq: Every day | ORAL | 3 refills | Status: DC

## 2024-03-24 NOTE — Assessment & Plan Note (Signed)
 Patient has been experiencing SOB and taking no inhalation medications. Spirometry evaluation reveals normal spirometry with mildly reduced FEV1 and FVC (both ~ 80 % predicted). Post nebulized albuterol tx revealed non-reversibility.   -no change, encouraged patient to observe how nebulized albuterol impacts daily activities after appointment. Consider albuterol inhaler at next appointment dependent on patient preference and further evaluation

## 2024-03-24 NOTE — Progress Notes (Signed)
   S:     Chief Complaint  Patient presents with   Medication Management    PFT - shortness of breath   73 y.o. female who presents for respiratory evaluation, education, and management. Patient arrives in good spirits and presents without any assistance.   Patient was referred and last seen by Primary Care Provider, Dr. Annabell Key, on 4/22 (today - shortly before this appointment).   Family/Social History: husband, father (both COPD, passed away)  PMH is significant for HTN, sleep apnea, obesity At last visit (today 4/22), patient reported worsening SOB and considered for appointment with pharmacy for PFT testing.   Patient reports shortness of breath with daily activities (cooking, showering, walking to car). Patient denies atopic sx consistent with allergic rhinitis - reports remaining in the house most days.  Patient reports infrequent tobacco use when young, which lasted no longer than a year. Reports family history of heavy smoking (both husband and father passed away due to COPD), and children (3 sons) are current tobacco users.  O: Review of Systems  Constitutional:  Positive for malaise/fatigue.  Neurological:  Positive for tremors (hand tremors post nebulization).  Psychiatric/Behavioral:  Positive for memory loss.   All other systems reviewed and are negative.   Physical Exam Constitutional:      Appearance: Normal appearance.  Neurological:     Mental Status: She is alert.  Psychiatric:        Behavior: Behavior normal.        Thought Content: Thought content normal.        Judgment: Judgment normal.    See "scanned report" or Documentation Flowsheet (discrete results - PFTs) for  Spirometry results. Patient provided good effort while attempting spirometry.  Lung Age = 56  Albuterol Neb  Lot# [id number]     Exp. 5/26  A/P: Patient has been experiencing SOB and taking no inhalation medications. Spirometry evaluation reveals normal spirometry with mildly reduced FEV1  and FVC (both ~ 80 % predicted). Post nebulized albuterol tx revealed non-reversibility.   -no change, encouraged patient to observe how nebulized albuterol impacts daily activities after appointment. Consider albuterol inhaler at next appointment dependent on patient preference and further evaluation  Hyperlipidemia -Sent refills for rosuvastatin  to pharmacy - patient reported pharmacy informed her that they had not yet received the prescription  Reviewed results of pulmonary function tests.  Patient verbalized understanding of treatment plan.  Written patient instructions provided.  Total time in face to face counseling 41 minutes.    Follow-up:  Pharmacist PRN PCP clinic visit PRN Patient seen with Teofilo Fellers, PharmD Candidate and Jeanine Millers, PharmD Candidate.

## 2024-03-24 NOTE — Patient Instructions (Signed)
 For the shortness of breath I am testing two things.   Heart: we are getting an ultrasound. This will scheduled in your MyChart  Lung: pulmonary function testing today to see if you have trouble with lung disease.

## 2024-03-24 NOTE — Progress Notes (Signed)
    SUBJECTIVE:   CHIEF COMPLAINT / HPI:   Christina Hardin is a 73 y.o. female presenting for follow-up of shortness of breath.  She was seen for this exact issue on 02/25/2024.  She has referral to cardiology placed and she is scheduled to see them in May.  She reports she is having severe shortness of breath even climbing into bed.  She sleeps on multiple pillows at night she denies having difficulty breathing when she lays flat however.  She has not had any peripheral edema.  She has had significant cardiac history for CAD with a stent.  She is never had any lung disease and is not currently on an inhaler.  PERTINENT  PMH / PSH: reviewed and updated.  OBJECTIVE:   BP (!) 140/102   Pulse 93   Ht 5\' 4"  (1.626 m)   Wt 197 lb 12.8 oz (89.7 kg)   SpO2 99%   BMI 33.95 kg/m   Well-appearing, no acute distress Cardio: Regular rate, regular rhythm, no murmurs on exam. Pulm: Clear, no wheezing, no crackles. No increased work of breathing Abdominal: bowel sounds present, soft, non-tender, non-distended Extremities: no peripheral edema  Neuro: alert and oriented x3, speech normal in content, no facial asymmetry, strength intact and equal bilaterally in UE and LE, pupils equal and reactive to light.  Psych:  Cognition and judgment appear intact. Alert, communicative  and cooperative with normal attention span and concentration. No apparent delusions, illusions, hallucinations    ASSESSMENT/PLAN:   Assessment & Plan Dyspnea on exertion We will order an echocardiogram today prior to cardiology appointment.  Will also consider lung etiology and set up with pharmacy for PFT testing today.  Encourage patient to keep appointment with cardiologist for further workup.  ECG obtained at last visit and was within normal limits.     Clem Currier, DO [facility]

## 2024-03-24 NOTE — Patient Instructions (Addendum)
It was nice to see you today!  Medication Changes: Continue all other medication the same.

## 2024-03-24 NOTE — Progress Notes (Deleted)
    SUBJECTIVE:   CHIEF COMPLAINT / HPI:   Christina Hardin is a 73 y.o. female presenting for ***  PERTINENT  PMH / PSH: reviewed and updated.  OBJECTIVE:   BP (!) 140/102   Pulse 93   Ht 5\' 4"  (1.626 m)   Wt 197 lb 12.8 oz (89.7 kg)   SpO2 99%   BMI 33.95 kg/m   ***-appearing, no acute distress Cardio: Regular rate, *** rhythm, no murmurs on exam. Pulm: Clear, no wheezing, no crackles. No increased work of breathing Abdominal: bowel sounds present, soft, non-tender, non-distended Extremities: no peripheral edema  Neuro: alert and oriented x3, speech normal in content, no facial asymmetry, strength intact and equal bilaterally in UE and LE, pupils equal and reactive to light.  Psych:  Cognition and judgment appear intact. Alert, communicative  and cooperative with normal attention span and concentration. No apparent delusions, illusions, hallucinations    ASSESSMENT/PLAN:   Assessment & Plan Dyspnea on exertion      Clem Currier, DO [facility]

## 2024-03-26 ENCOUNTER — Other Ambulatory Visit: Payer: Self-pay | Admitting: Student

## 2024-03-26 DIAGNOSIS — Z1231 Encounter for screening mammogram for malignant neoplasm of breast: Secondary | ICD-10-CM

## 2024-03-26 NOTE — Progress Notes (Signed)
 Reviewed and agree with Dr Macky Lower plan.

## 2024-03-27 ENCOUNTER — Encounter: Admitting: Speech Pathology

## 2024-03-27 ENCOUNTER — Ambulatory Visit

## 2024-03-27 ENCOUNTER — Encounter: Payer: Self-pay | Admitting: Gastroenterology

## 2024-03-31 ENCOUNTER — Encounter

## 2024-03-31 ENCOUNTER — Ambulatory Visit: Admitting: Student

## 2024-03-31 ENCOUNTER — Ambulatory Visit: Admitting: Physical Therapy

## 2024-04-03 ENCOUNTER — Ambulatory Visit

## 2024-04-03 ENCOUNTER — Encounter: Admitting: Speech Pathology

## 2024-04-07 ENCOUNTER — Other Ambulatory Visit: Payer: Self-pay | Admitting: Student

## 2024-04-07 DIAGNOSIS — M25551 Pain in right hip: Secondary | ICD-10-CM

## 2024-04-10 ENCOUNTER — Ambulatory Visit
Admission: RE | Admit: 2024-04-10 | Discharge: 2024-04-10 | Disposition: A | Discharge status: Home / Self Care | Source: Ambulatory Visit | Attending: Physician Assistant | Admitting: Physician Assistant

## 2024-04-10 DIAGNOSIS — Z1231 Encounter for screening mammogram for malignant neoplasm of breast: Secondary | ICD-10-CM

## 2024-04-14 ENCOUNTER — Ambulatory Visit: Payer: Self-pay | Admitting: Student

## 2024-04-14 ENCOUNTER — Ambulatory Visit (HOSPITAL_BASED_OUTPATIENT_CLINIC_OR_DEPARTMENT_OTHER)

## 2024-04-14 DIAGNOSIS — R0609 Other forms of dyspnea: Secondary | ICD-10-CM | POA: Diagnosis not present

## 2024-04-14 LAB — ECHOCARDIOGRAM COMPLETE
Area-P 1/2: 3.53 cm2
S' Lateral: 2.27 cm

## 2024-04-16 ENCOUNTER — Ambulatory Visit: Payer: Self-pay | Admitting: Student

## 2024-04-16 NOTE — Progress Notes (Deleted)
 Cardiology Office Note:    Date:  04/16/2024   ID:  JLAH BALTHAZAR, DOB 1951-06-04, MRN [id number]  PCP:  Clem Currier, DO   [facility] Providers Cardiologist:  Hazle Lites, MD { Click to update primary MD,subspecialty MD or APP then REFRESH:1}    Referring MD: Charise Companion, MD   No chief complaint on file. ***  History of Present Illness:    Christina Hardin is a 73 y.o. female is seen at the request of Dr Andree Kayser for evaluation of CAD. She has a history of mild OSA, breast CA, obesity s/p gastric bypass, thoracic aortic aneurysm. In Nov 2022 Coronary CTA showed a distal RCA stenosis. She subsequently had cardiac cath and stenting of the RCA with DES. When seen recently by PCP she was complaining of shortness of breath.   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   Diastolic dysfunction without heart failure 07/06/2016   Diverticulosis of colon 01/30/2007   Qualifier: Diagnosis of   By: Rosanne Commodore MD, Garald Jumbo     Replacing diagnoses that were inactivated after the 03/04/23 regulatory import     Ductal carcinoma in situ (DCIS) of left breast    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   Family history of breast cancer    Gastric bypass status for obesity    History of breast cancer 11/16/2016   NEPHROLITHIASIS 09/17/2008   Qualifier: Diagnosis of   By: Bronson Canny MD, Sammie Crigler         Personal history of chemotherapy 2017   Recurrent UTI 03/10/2024   Reflux esophagitis 01/30/2007   Qualifier: Diagnosis of   By: Rosanne Commodore MD, Garald Jumbo        Renal cyst, right 09/17/2008   Qualifier: Diagnosis of    By: Bronson Canny MD, Sammie Crigler         Rosacea 01/30/2007   Qualifier: Diagnosis of   By: Rosanne Commodore MD, Garald Jumbo        S/P drug eluting coronary stent placement 03/13/2024   [place] CCTA 10/05/2021: Mild to moderate (50-60% diameter stenosis), single discreet stenosis of distal RCA. Minimal luminal narrowing of the LAD arteryCardiac catheterization 10/30/2021: DES to RCA, EF 60%, LVEDP 10 mmHg     Thoracic aortic aneurysm [facility])    Thoracic aortic aneurysm [facility])     Past Surgical History:  Procedure Laterality Date   ABDOMINAL HYSTERECTOMY     APPENDECTOMY  1958   BLADDER REPAIR     BREAST BIOPSY Left    BREAST LUMPECTOMY Left 10/2016   BREAST LUMPECTOMY WITH RADIOACTIVE SEED AND SENTINEL LYMPH NODE BIOPSY Left 10/09/2016   Procedure: BREAST LUMPECTOMY WITH RADIOACTIVE SEED AND SENTINEL LYMPH NODE BIOPSY;  Surgeon: Dareen Ebbing, MD;  Location: [facility];  Service: General;  Laterality: Left;   CARPAL TUNNEL RELEASE Left 08/22/2017   Procedure: LEFT CARPAL TUNNEL RELEASE;  Surgeon: Lyanne Sample, MD;  Location: [facility];  Service: Orthopedics;  Laterality: Left;   CARPOMETACARPEL SUSPENSION PLASTY Left 08/22/2017   Procedure: SUSPENSIONPLASTY  LEFT THUMB TRAPENZIUM EXCISION ABDUCTOR POLLICIS LONGUS;  Surgeon: Lyanne Sample, MD;  Location: [facility];  Service: Orthopedics;  Laterality: Left;   COLONOSCOPY W/ POLYPECTOMY     GASTRIC BYPASS     HYSTERECTOMY ABDOMINAL WITH SALPINGECTOMY     ULNAR NERVE TRANSPOSITION Left 08/22/2017   Procedure: ULNAR NERVE DECOMPRESSION;  Surgeon: Lyanne Sample, MD;  Location: [facility];  Service: Orthopedics;  Laterality: Left;    Current Medications: No outpatient medications have been marked as taking for the 04/21/24 encounter (Appointment) with [place], Ozro Russett M, MD.     Allergies:   Dilaudid  [hydromorphone  hcl] and Propoxyphene hcl   Social History   Socioeconomic History   Marital status:  Widowed    Spouse name: Ambrosio Junker   Number of children: 3   Years of education: Not on file   Highest education level: Not on file  Occupational History    Comment: Retired  Tobacco Use   Smoking status: Never    Passive exposure: Never   Smokeless tobacco: Never  Vaping Use   Vaping status: Never Used  Substance and Sexual Activity   Alcohol use: No    Alcohol/week: 0.0 standard drinks of alcohol   Drug use: No   Sexual activity: Never    Birth control/protection: Post-menopausal  Other Topics Concern   Not on file  Social History Narrative   Epworth Sleepiness Scale Score: 10       Current Social History 08/15/2017        Who lives at home: Patient lives alone in one level home 08/15/2017       Transportation: Patient has own vehicle  08/15/2017   Important Relationships: Fiance, 3 sons, 10 grandkids, mother, 2 brothers, 1 sister and their families and many friends 08/15/2017    Pets: None 08/15/2017   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 Ducatte, RN, BSN       Lives with middle son, Reymundo Caulk. Has a dog Rascal that is sick - might have to put him down                                                                                                 Social Drivers of Corporate investment banker Strain: Low Risk  (03/23/2024)   Overall Financial Resource Strain (CARDIA)    Difficulty of Paying Living Expenses: Not hard at all  Food Insecurity: No Food Insecurity (03/23/2024)   Hunger Vital Sign    Worried About Running Out of Food in the Last Year: Never true    Ran Out of Food in the Last Year: Never true  Transportation Needs: No Transportation Needs  (03/23/2024)   PRAPARE - Administrator, Civil Service (Medical): No  Lack of Transportation (Non-Medical): No  Physical Activity: Inactive (03/23/2024)   Exercise Vital Sign    Days of Exercise per Week: 0 days    Minutes of Exercise per Session: 0 min  Stress: No Stress Concern Present (03/23/2024)   [employer] of Occupational Health - Occupational Stress Questionnaire    Feeling of Stress : Not at all  Social Connections: Moderately Integrated (03/23/2024)   Social Connection and Isolation Panel [NHANES]    Frequency of Communication with Friends and Family: Twice a week    Frequency of Social Gatherings with Friends and Family: Once a week    Attends Religious Services: More than 4 times per year    Active Member of [employer] or Organizations: Yes    Attends Banker Meetings: Never    Marital Status: Widowed     Family History: The patient's ***family history includes Arthritis in her mother; Atrial fibrillation in her mother; Breast cancer in her cousin, cousin, and maternal aunt; COPD in her father; Cancer in her cousin; Dementia in her mother; Emphysema in her father; Gout in her mother; Healthy in her brother and brother; Heart attack in her maternal aunt, maternal grandfather, maternal uncle, and paternal uncle; Hypertension in her brother, brother, mother, and sister; Leukemia in her cousin and other family members; Lung cancer in her father; Melanoma in her father; Prostate cancer in her maternal uncle; Sleep apnea in her sister; Stroke in her mother.  ROS:   Please see the history of present illness.    *** All other systems reviewed and are negative.  EKGs/Labs/Other Studies Reviewed:    The following studies were reviewed today: Echo 04/14/24: IMPRESSIONS     1. Left ventricular ejection fraction, by estimation, is 55 to 60%. Left  ventricular ejection fraction by 3D volume is 53 %. The left ventricle has  normal function. The left ventricle  has no regional wall motion  abnormalities. There is mild concentric  left ventricular hypertrophy. Left ventricular diastolic parameters are  consistent with Grade I diastolic dysfunction (impaired relaxation).   2. Right ventricular systolic function is normal. The right ventricular  size is normal. Tricuspid regurgitation signal is inadequate for assessing  PA pressure.   3. The mitral valve is normal in structure. No evidence of mitral valve  regurgitation. No evidence of mitral stenosis.   4. The aortic valve is tricuspid. Aortic valve regurgitation is not  visualized. No aortic stenosis is present.   5. There is mild dilatation of the ascending aorta, measuring 41 mm.   6. The inferior vena cava is normal in size with greater than 50%  respiratory variability, suggesting right atrial pressure of 3 mmHg.   Comparison(s): Prior images unable to be directly viewed, comparison made  by report only. No significant change from prior study. Prior images  reviewed side by side.        Recent Labs: 05/01/2023: ALT 12 01/10/2024: BUN 20; Creatinine, Ser 1.00; Hemoglobin 11.1; Magnesium 2.3; Platelets 246; Potassium 4.6; Sodium 139; TSH 0.698  Recent Lipid Panel    Component Value Date/Time   CHOL 150 01/10/2024 1206   TRIG 102 01/10/2024 1206   HDL 47 01/10/2024 1206   CHOLHDL 3.2 01/10/2024 1206   CHOLHDL 2.9 12/10/2014 1117   VLDL 22 12/10/2014 1117   LDLCALC 84 01/10/2024 1206     Risk Assessment/Calculations:   {Does this patient have ATRIAL FIBRILLATION?:[phone]}  No BP recorded.  {Refresh Note OR Click here to enter BP  :1}***  Physical Exam:    VS:  There were no vitals taken for this visit.    Wt Readings from Last 3 Encounters:  03/24/24 197 lb 12.8 oz (89.7 kg)  03/23/24 202 lb (91.6 kg)  03/12/24 202 lb 4 oz (91.7 kg)     GEN: *** Well nourished, well developed in no acute distress HEENT: Normal NECK: No JVD; No carotid bruits LYMPHATICS: No  lymphadenopathy CARDIAC: ***RRR, no murmurs, rubs, gallops RESPIRATORY:  Clear to auscultation without rales, wheezing or rhonchi  ABDOMEN: Soft, non-tender, non-distended MUSCULOSKELETAL:  No edema; No deformity  SKIN: Warm and dry NEUROLOGIC:  Alert and oriented x 3 PSYCHIATRIC:  Normal affect   ASSESSMENT:    No diagnosis found. PLAN:    In order of problems listed above:  ***      {Are you ordering a CV Procedure (e.g. stress test, cath, DCCV, TEE, etc)?   Press F2        :[id number]}    Medication Adjustments/Labs and Tests Ordered: Current medicines are reviewed at length with the patient today.  Concerns regarding medicines are outlined above.  No orders of the defined types were placed in this encounter.  No orders of the defined types were placed in this encounter.   There are no Patient Instructions on file for this visit.   Signed, Flordia Kassem [place], MD  04/16/2024 4:00 PM    [facility]

## 2024-04-21 ENCOUNTER — Ambulatory Visit: Admitting: Cardiology

## 2024-05-06 ENCOUNTER — Other Ambulatory Visit: Payer: Self-pay | Admitting: Student

## 2024-05-06 DIAGNOSIS — B379 Candidiasis, unspecified: Secondary | ICD-10-CM

## 2024-05-19 ENCOUNTER — Encounter: Payer: Self-pay | Admitting: Student

## 2024-05-19 ENCOUNTER — Ambulatory Visit (INDEPENDENT_AMBULATORY_CARE_PROVIDER_SITE_OTHER): Admitting: Student

## 2024-05-19 VITALS — BP 119/65 | HR 67 | Wt 199.6 lb

## 2024-05-19 DIAGNOSIS — M25552 Pain in left hip: Secondary | ICD-10-CM

## 2024-05-19 DIAGNOSIS — M25551 Pain in right hip: Secondary | ICD-10-CM | POA: Diagnosis not present

## 2024-05-19 MED ORDER — METHYLPREDNISOLONE ACETATE 40 MG/ML IJ SUSP
40.0000 mg | Freq: Once | INTRAMUSCULAR | Status: AC
  Administered 2024-05-19: 40 mg via INTRAMUSCULAR

## 2024-05-19 NOTE — Assessment & Plan Note (Signed)
 Suspect hip pain is a combination of trochanteric bursitis and chronic lumbar degeneration.  Discussed with patient conservative treatment options including injection of the trochanteric bursa to help with her pain.  Patient is agreeable with plan.

## 2024-05-19 NOTE — Progress Notes (Signed)
    SUBJECTIVE:   CHIEF COMPLAINT / HPI:   Christina Hardin is a 73 y.o. female presenting for follow up and hip pain.   Bilateral hip pain Worse on right where she is unable to fully on her side She moved back pain radiates down her right leg into her foot with associated numbness No saddle anesthesia, or urinary/fecal incontinence  PERTINENT  PMH / PSH: reviewed and updated.  OBJECTIVE:   BP 119/65   Pulse 67   Wt 199 lb 9.6 oz (90.5 kg)   SpO2 90%   BMI 34.26 kg/m   Chronically ill-appearing, no acute distress Cardio: Regular rate, regular rhythm, no murmurs on exam. Pulm: Clear, no wheezing, no crackles. No increased work of breathing Abdominal: bowel sounds present, soft, non-tender, non-distended  MSK: tenderness to palpation over t12-L1 spinous process in the back, tenderness over the piriformis muscles, normal ROM and strength, tenderness to palpation over the greater trochanter.   ASSESSMENT/PLAN:   Assessment & Plan Bilateral hip pain Suspect hip pain is a combination of trochanteric bursitis and chronic lumbar degeneration.  Discussed with patient conservative treatment options including injection of the trochanteric bursa to help with her pain.  Patient is agreeable with plan.   PROCEDURE: INJECTION: Patient was given informed consent, signed copy in the chart. Appropriate time out was taken. Area prepped and draped in usual sterile fashion. Ethyl chloride was  used for local anesthesia. A 21 gauge 1 1/2 inch needle was used.. 1 cc of methylprednisolone  40 mg/ml plus  4 cc of 1% lidocaine  without epinephrine  was injected into the right trochanteric bursa using a(n) lateral approach.   The patient tolerated the procedure well. There were no complications. Post procedure instructions were given.     Clem Currier, DO [facility]

## 2024-05-19 NOTE — Patient Instructions (Signed)
 Today you received an injection with corticosteroid. This injection is usually done in response to pain and inflammation. There is some "numbing" medicine also in the shot so the injected area may be numb and feel really good for the next couple of hours. The numbing medicine usually wears off in 2-3 hours though, and then your pain level will be right back where it was before the injection.   The actually benefit from the steroid injection is usually noticed in 2-7 days. You may actually experience a small (as in 10%) INCREASE in pain in the first 24 hours---that is common.   Things to watch out for that you should contact us or a health care provider urgently would include: 1. Unusual (as in more than 10%) increase in pain 2. New fever > 101.5 3. New swelling or redness of the injected area.  4. Streaking of red lines around the area injected.

## 2024-05-21 ENCOUNTER — Encounter: Payer: Self-pay | Admitting: Gastroenterology

## 2024-05-21 ENCOUNTER — Ambulatory Visit: Admitting: Gastroenterology

## 2024-05-21 VITALS — BP 138/82 | HR 76 | Ht 64.0 in | Wt 199.4 lb

## 2024-05-21 DIAGNOSIS — D508 Other iron deficiency anemias: Secondary | ICD-10-CM

## 2024-05-21 DIAGNOSIS — Z853 Personal history of malignant neoplasm of breast: Secondary | ICD-10-CM

## 2024-05-21 DIAGNOSIS — Z8719 Personal history of other diseases of the digestive system: Secondary | ICD-10-CM | POA: Diagnosis not present

## 2024-05-21 DIAGNOSIS — R0789 Other chest pain: Secondary | ICD-10-CM

## 2024-05-21 DIAGNOSIS — K5900 Constipation, unspecified: Secondary | ICD-10-CM | POA: Diagnosis not present

## 2024-05-21 DIAGNOSIS — D649 Anemia, unspecified: Secondary | ICD-10-CM

## 2024-05-21 DIAGNOSIS — K5904 Chronic idiopathic constipation: Secondary | ICD-10-CM

## 2024-05-21 DIAGNOSIS — Z860100 Personal history of colon polyps, unspecified: Secondary | ICD-10-CM

## 2024-05-21 DIAGNOSIS — Z860101 Personal history of adenomatous and serrated colon polyps: Secondary | ICD-10-CM

## 2024-05-21 NOTE — Progress Notes (Signed)
 Chief Complaint:constipation, anemia Primary GI Doctor: Dr. Rosaline Coma   HPI:  Patient is a  73  year old female patient with past medical history of HTN, mild dementia, sleep apnea, and obesity, who was referred to me by Clem Currier, DO on 01/15/24 for a complaint of constipation, anemia .     Interval History    Patient presents for evaluation of anemia and constipation. Patient denies GERD or dysphagia.  Patient has had intermittent episodes of chest pain/pressure she states will last several minutes then subsides on its own.  She has had the chest discomfort for the past few months. She states this typically occurs several times a week. She does not correlate the pain with activity or eating. She states it has woken her up at night. She states it does not radiate.  She does admit she is pretty sedentary but will get up and do light housecleaning at home.Patient denies nausea, vomiting, or weight loss.   Patient also has history of constipation and takes OTC ExLax capsules prn which helps. She states if she doesn't take anything she will have a bowel movement 1-2 times a week. She states when she does have bowel  movement it is a significant amount. She denies black tarry stools or blood in stool. Her PCP notes her starting iron  supplement, but patient states she is not taking any iron  supplements.  Patient states she does have mild memory impairment and dementia but states she recalls she has had issues with anemia in the past.  No hematemesis.  No vaginal bleeding.  Patient consumes meat.  Patient does tell me she lives with her son and his girlfriend. Her son does the cooking. She typically has small meals during the day which can consist of toast and fruit.  She tells me she is trying to lose weight.  She reports she has had some increased fatigue as of lately.   Patient takes OTC Tylenol  as needed for back and hip pain.  She avoids NSAIDs.  Nonsmoker. No alcohol use.   Patient on xarelto  20mg   po daily.  Patients last colonoscopy was in 2013 with 1 sessile polyp.   Patient's family history includes personal history of breast CA, multiple aunts with breast CA.  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   Diastolic dysfunction without heart failure 07/06/2016   Diverticulosis of colon 01/30/2007   Qualifier: Diagnosis of   By: Rosanne Commodore MD, Garald Jumbo     Replacing diagnoses that were inactivated after the 03/04/23 regulatory import     Ductal carcinoma in situ (DCIS) of left breast    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   Family history of breast cancer    Gastric bypass status for obesity    History of breast cancer 11/16/2016   History of colon polyps    sessile polyp  2013   NEPHROLITHIASIS 09/17/2008   Qualifier: Diagnosis of   By: Bronson Canny MD, Sammie Crigler         Personal history of chemotherapy 2017   Recurrent UTI 03/10/2024   Reflux esophagitis 01/30/2007   Qualifier: Diagnosis of   By: Rosanne Commodore MD, Garald Jumbo        Renal cyst, right 09/17/2008   Qualifier: Diagnosis of   By: Bronson Canny MD, Sammie Crigler         Rosacea 01/30/2007   Qualifier: Diagnosis of   By: Rosanne Commodore MD, Garald Jumbo        S/P drug eluting coronary stent placement 03/13/2024   [place] CCTA 10/05/2021: Mild to moderate (50-60% diameter stenosis), single discreet stenosis of distal RCA. Minimal luminal narrowing of the LAD arteryCardiac catheterization 10/30/2021: DES to RCA, EF 60%, LVEDP 10  mmHg     Thoracic aortic aneurysm [facility])    Thoracic aortic aneurysm [facility])     Past Surgical History:  Procedure Laterality Date   ABDOMINAL HYSTERECTOMY     APPENDECTOMY  1958   BLADDER REPAIR     BREAST BIOPSY Left    BREAST LUMPECTOMY Left 10/2016   BREAST LUMPECTOMY WITH RADIOACTIVE SEED AND SENTINEL LYMPH NODE BIOPSY Left 10/09/2016   Procedure: BREAST LUMPECTOMY WITH RADIOACTIVE SEED AND SENTINEL LYMPH NODE BIOPSY;  Surgeon: Dareen Ebbing, MD;  Location: [facility];  Service: General;  Laterality: Left;   CARPAL TUNNEL RELEASE Left 08/22/2017   Procedure: LEFT CARPAL TUNNEL RELEASE;  Surgeon: Lyanne Sample, MD;  Location: [facility];  Service: Orthopedics;  Laterality: Left;   CARPOMETACARPEL SUSPENSION PLASTY Left 08/22/2017   Procedure: SUSPENSIONPLASTY LEFT THUMB TRAPENZIUM EXCISION ABDUCTOR POLLICIS LONGUS;  Surgeon: Lyanne Sample, MD;  Location: [facility];  Service: Orthopedics;  Laterality: Left;   COLONOSCOPY W/ POLYPECTOMY     CORONARY ANGIOPLASTY WITH STENT PLACEMENT     GASTRIC BYPASS     HYSTERECTOMY ABDOMINAL WITH SALPINGECTOMY     ULNAR NERVE TRANSPOSITION Left 08/22/2017   Procedure: ULNAR NERVE DECOMPRESSION;  Surgeon: Lyanne Sample, MD;  Location: [facility];  Service: Orthopedics;  Laterality: Left;    Current Outpatient Medications  Medication Sig Dispense Refill   acetaminophen  (TYLENOL ) 325 MG tablet Take 325-650 mg by mouth every 6 (six) hours as needed.     Cyanocobalamin 2500 MCG CHEW Chew 2,500 mcg by mouth daily. Vitamin B12     gabapentin  (NEURONTIN ) 300 MG capsule TAKE 2 CAPSULES BY MOUTH 3 TIMES A DAY 360 capsule 3   losartan  (COZAAR ) 100 MG tablet Take 1 tablet (100 mg total) by mouth daily. 90 tablet 3   metoprolol  succinate (TOPROL -XL) 25 MG 24 hr tablet Take 1 tablet (25 mg total) by mouth daily. 90 tablet 3   nystatin  cream (MYCOSTATIN ) APPLY TOPICALLY 4 (FOUR) TIMES DAILY TO RASH FOR 14 TO  15 DAYS 60 g 0   rivaroxaban  (XARELTO ) 20 MG TABS tablet Take 1 tablet (20 mg total) by mouth daily with supper. With a meal 90 tablet 3   rosuvastatin  (CRESTOR ) 20 MG tablet Take 1 tablet (20 mg total) by mouth daily. 90 tablet 3   No current facility-administered medications for this visit.    Allergies as of 05/21/2024 - Review Complete 05/21/2024  Allergen Reaction Noted   Dilaudid  [hydromorphone  hcl] Shortness Of Breath 08/09/2016   Heme-iron -polypeptide [iron ]  05/21/2024   Propoxyphene hcl Other (See Comments) 01/03/2007    Family History  Problem Relation Age of  Onset   Atrial fibrillation Mother    Dementia Mother    Hypertension Mother    Gout Mother    Arthritis Mother    Stroke Mother    COPD Father    Emphysema Father    Lung cancer Father    Melanoma Father    Hypertension Sister    Sleep apnea Sister    Hypertension Brother    Hypertension Brother    Heart attack Maternal Aunt    Breast cancer Maternal Aunt        dx over 50   Heart attack Maternal Uncle    Prostate cancer Maternal Uncle        unsure if this was truly prostate cancer   Heart attack Paternal Uncle    Heart attack Maternal Grandfather    Breast cancer Cousin        mat first cousin   Breast cancer Cousin        mat first cousin   Cancer Cousin        father's maternal first cousin with Breast, Ovarian, Uterine Cancer   Leukemia Cousin        3 of father's maternal first cousin   Leukemia Other        father's maternal uncle   Leukemia Other        father's maternal uncle   Colon cancer Neg Hx    Colon polyps Neg Hx    Esophageal cancer Neg Hx    Pancreatic cancer Neg Hx    Stomach cancer Neg Hx     Review of Systems:    Constitutional: No weight loss, fever, chills, weakness or fatigue HEENT: Eyes: No change in vision               Ears, Nose, Throat:  No change in hearing or congestion Skin: No rash or itching Cardiovascular: No chest pain, chest pressure or palpitations    Respiratory: No SOB or cough Gastrointestinal: See HPI and otherwise negative Genitourinary: No dysuria or change in urinary frequency Neurological: No headache, dizziness or syncope Musculoskeletal: No new muscle or joint pain Hematologic: No bleeding or bruising Psychiatric: No history of depression or anxiety    Physical Exam:  Vital signs: BP 138/82   Pulse 76   Ht 5' 4 (1.626 m)   Wt 199 lb 6 oz (90.4 kg)   SpO2 97%   BMI 34.22 kg/m   Constitutional:   Pleasant  female appears to be in NAD, Well developed, Well nourished, alert and cooperative Throat: Oral cavity and pharynx without inflammation, swelling or lesion.  Respiratory: Respirations even and unlabored. Lungs clear to auscultation bilaterally.   No wheezes, crackles, or rhonchi.  Cardiovascular: Normal S1, S2. Regular rate and rhythm. No peripheral edema, cyanosis or pallor.  Gastrointestinal:  Soft, nondistended, nontender. No rebound or guarding. Normal bowel sounds. No appreciable masses or hepatomegaly. Rectal:  Not performed.  Declined Msk:  Symmetrical without gross deformities. Without edema, no deformity or joint abnormality.  Neurologic:  Alert and  oriented x4;  grossly normal neurologically.  Skin:   Dry and intact without significant lesions or rashes. Psychiatric: Oriented to person, place and time. Demonstrates good judgement and reason without abnormal affect or behaviors.  RELEVANT LABS AND IMAGING: CBC    Latest Ref Rng & Units 01/10/2024   12:06 PM 05/01/2023    8:37 AM 05/01/2023    8:21 AM  CBC  WBC 3.4 - 10.8 x10E3/uL 6.3  5.5  Hemoglobin 11.1 - 15.9 g/dL 13.0  86.5  78.4   Hematocrit 34.0 - 46.6 % 35.7  36.5  37.0   Platelets 150 - 450 x10E3/uL 246  256       CMP     Latest Ref Rng & Units 01/10/2024   12:06 PM 05/01/2023    8:37 AM 05/01/2023    8:21 AM  CMP  Glucose 70 - 99 mg/dL 83  96  94   BUN 8 - 27 mg/dL 20  12  15    Creatinine 0.57 - 1.00 mg/dL 6.96  2.95  2.84   Sodium  134 - 144 mmol/L 139  137  139   Potassium 3.5 - 5.2 mmol/L 4.6  3.7  3.8   Chloride 96 - 106 mmol/L 105  103  105   CO2 20 - 29 mmol/L 20  21    Calcium  8.7 - 10.3 mg/dL 13.2  44.0    Total Protein 6.5 - 8.1 g/dL  7.7    Total Bilirubin 0.3 - 1.2 mg/dL  0.8    Alkaline Phos 38 - 126 U/L  105    AST 15 - 41 U/L  23    ALT 0 - 44 U/L  12       Lab Results  Component Value Date   TSH 0.698 01/10/2024  04/30/2012 colonoscopy with Dr. Doss Gay internal hemorrhoids Few scattered sigmoid diverticula One small sessile right colon polyp 04/14/24 echo- Left ventricular ejection fraction, by estimation, is 55 to 60%.   Assessment:     73 year old female patient who presents for evaluation of chronic anemia and constipation.  Patient does have mild dementia however recalls most information during our discussion.  Her anemia is noted back since 2017, baseline 10-11 No overt bleeding.  Declined DRE today however will send home with fecal occult card. On last CBC, Hgb dropping from 12.6> 11.1. Unclear source of anemia at this time.  Patient not taking any oral supplementation we discussed starting every other day, patient has concerns with side effect of constipation.  If she is unable to tolerate we can consider IV iron  infusions in future.  Patient is on Xarelto  which increases risk of bleeding.  Patient's last colonoscopy was back in 2013 and she had internal hemorrhoids, diverticulosis, and 1 small sessile polyp.  Would recommend the patient have upper GI endoscopy and colonoscopy to evaluate for source of bleeding however patient is currently pending cardiac evaluation for chest pain/discomfort.  We did discuss how sometimes reflux can cause chest discomfort and she is in agreement to follow GERD diet along with starting famotidine  twice daily to see if it helps relieve her symptoms.  Patient reports she has been eating less however trying to lose weight.  Her son cooks most of her meals however she  does have small snacks throughout the day.  We did discuss adding protein shakes.      For the constipation we discussed using the Ex-Lax more frequently to help prevent backflow and to regulate her bowels.  Patient will take Ex-Lax every other day and titrate as needed to have at least 3 bowel movements a week.  I will have patient follow-up in September after her with cardiology to reevaluate.  Plan: -Recommend protein shakes BID-TID -OTC iron  supplement 1 tab every other day -No NSAIDs -ExLax po daily as needed, titrate - Recommend GERD diet, no late meals -OTC Pecpid 20mg  twice daily scheduled -Check fecal occult card  -Will defer procedures until  patient cleared by cardiologist (appt in Sept). Provided information to patient as she was not aware of appt. -follow-up in 3 mths with me  Thank you for the courtesy of this consult. Please call me with any questions or concerns.   Auburn Hert, FNP-C [facility] 05/21/2024, 1:56 PM  Cc: Clem Currier, DO

## 2024-05-21 NOTE — Patient Instructions (Addendum)
 Constipation -Recommend high fiber diet -Continue Exlax po daily as needed- you should not go more than 2 days without a bowel movement  Dietary recommendations Recommend protein shakes when not hungry  Iron  deficiency  -Recommend Iron  over the counter 1 tablet every other day on empty stomach with Vitamin C for better absorption. You can ask pharmacist to direct you to the right medication. Some people do prenatal vitamins with iron  because its easier on stomach.  - Important to note: Iron  can cause constipation and also darken your stool. -No NSAIDs  GERD ? ( may cause chest discomfort) Recommend GERD diet, no late meals 3-4 hours before lying down OTC Pepcid  20 mg 1 tablet 30 minutes before breakfast and dinner - if chest pain improves may be related to reflux.  Cardiology appointment Dr. Olinda Bertrand on Sept 10th at 1:40pm  _______________________________________________________  If your blood pressure at your visit was 140/90 or greater, please contact your primary care physician to follow up on this.  _______________________________________________________  If you are age 66 or older, your body mass index should be between 23-30. Your Body mass index is 34.22 kg/m. If this is out of the aforementioned range listed, please consider follow up with your Primary Care Provider.  If you are age 31 or younger, your body mass index should be between 19-25. Your Body mass index is 34.22 kg/m. If this is out of the aformentioned range listed, please consider follow up with your Primary Care Provider.   ________________________________________________________  The [facility] providers would like to encourage you to use MYCHART to communicate with providers for non-urgent requests or questions.  Due to long hold times on the telephone, sending your provider a message by [facility] may be a faster and more efficient way to get a response.  Please allow 48 business hours for a response.  Please  remember that this is for non-urgent requests.  _______________________________________________________  Thank you for trusting me with your gastrointestinal care. Deanna May, RNP

## 2024-05-21 NOTE — Progress Notes (Signed)
 I agree with the assessment and plan as outlined by Ms. May.

## 2024-05-28 ENCOUNTER — Other Ambulatory Visit: Payer: Self-pay | Admitting: Student

## 2024-05-28 DIAGNOSIS — B379 Candidiasis, unspecified: Secondary | ICD-10-CM

## 2024-06-09 ENCOUNTER — Ambulatory Visit: Admitting: Student

## 2024-06-09 ENCOUNTER — Encounter: Payer: Self-pay | Admitting: Student

## 2024-06-09 VITALS — BP 153/92 | HR 64 | Wt 200.6 lb

## 2024-06-09 DIAGNOSIS — M25552 Pain in left hip: Secondary | ICD-10-CM | POA: Diagnosis not present

## 2024-06-09 DIAGNOSIS — M25551 Pain in right hip: Secondary | ICD-10-CM | POA: Diagnosis not present

## 2024-06-09 DIAGNOSIS — M48062 Spinal stenosis, lumbar region with neurogenic claudication: Secondary | ICD-10-CM | POA: Diagnosis not present

## 2024-06-09 MED ORDER — OXYCODONE HCL 5 MG PO TABS
2.5000 mg | ORAL_TABLET | ORAL | 0 refills | Status: DC | PRN
Start: 2024-06-09 — End: 2024-09-01

## 2024-06-09 NOTE — Patient Instructions (Signed)
 It was great to see you today!   I am prescribing a new medication called oxycodone  2.5 mg. Follow up in 3 weeks to let me know hoe you are doing.   I want you to continue doing physical therapy.   Future Appointments  Date Time Provider Department Center  07/29/2024  1:30 PM [facility]  08/12/2024  1:40 PM Tolia, Sunit, DO [facility]  03/25/2025 10:30 AM [facility] ANNUAL WELLNESS VISIT [facility]    Please arrive 15 minutes before your appointment to ensure smooth check in process.    Please call the clinic at [phone] if your symptoms worsen or you have any concerns.  Thank you for allowing me to participate in your care, Dr. Damien Pinal [facility]

## 2024-06-09 NOTE — Progress Notes (Unsigned)
    SUBJECTIVE:   CHIEF COMPLAINT / HPI:   JANVI AMMAR is a 73 y.o. female presenting for ***  PERTINENT  PMH / PSH: reviewed and updated.  OBJECTIVE:   BP (!) 153/92   Pulse 64   Wt 200 lb 9.6 oz (91 kg)   SpO2 100%   BMI 34.43 kg/m   ***-appearing, no acute distress Cardio: Regular rate, *** rhythm, no murmurs on exam. Pulm: Clear, no wheezing, no crackles. No increased work of breathing Abdominal: bowel sounds present, soft, non-tender, non-distended Extremities: no peripheral edema  Neuro: alert and oriented x3, speech normal in content, no facial asymmetry, strength intact and equal bilaterally in UE and LE, pupils equal and reactive to light.  Psych:  Cognition and judgment appear intact. Alert, communicative  and cooperative with normal attention span and concentration. No apparent delusions, illusions, hallucinations    ASSESSMENT/PLAN:   Assessment & Plan      Damien Pinal, DO [facility]

## 2024-06-09 NOTE — Progress Notes (Unsigned)
    SUBJECTIVE:   CHIEF COMPLAINT / HPI:   Christina Hardin is a 73 y.o. female presenting for worsening back pain.   PERTINENT  PMH / PSH: reviewed and updated.  OBJECTIVE:   BP (!) 153/92   Pulse 64   Wt 200 lb 9.6 oz (91 kg)   SpO2 100%   BMI 34.43 kg/m   ***-appearing, no acute distress Cardio: Regular rate, *** rhythm, no murmurs on exam. Pulm: Clear, no wheezing, no crackles. No increased work of breathing Abdominal: bowel sounds present, soft, non-tender, non-distended Extremities: no peripheral edema  Neuro: alert and oriented x3, speech normal in content, no facial asymmetry, strength intact and equal bilaterally in UE and LE, pupils equal and reactive to light.  Psych:  Cognition and judgment appear intact. Alert, communicative  and cooperative with normal attention span and concentration. No apparent delusions, illusions, hallucinations    ASSESSMENT/PLAN:   Assessment & Plan Bilateral hip pain  Spinal stenosis, lumbar region with neurogenic claudication      Damien Pinal, DO [facility]

## 2024-06-10 NOTE — Assessment & Plan Note (Signed)
>>  ASSESSMENT AND PLAN FOR SPINAL STENOSIS, LUMBAR REGION WITH NEUROGENIC CLAUDICATION WRITTEN ON 06/10/2024 12:28 PM BY Marko Skalski, DO  Failed steroid shot.  Patient is not a candidate for NSAIDs due to being anticoagulated with Xarelto .  She is on 600 mg gabapentin  3 times daily without relief.  Due to her failing conservative measures.  Will continue with physical therapy at home per patient preference and initiate course of oxycodone .  Will provide 2.5 mg 15 tablets for 30 days. Suspect the patient may need to continue on narcotics due to level of pain and poor candidacy for surgery. Reassuringly she does not have any neurological signs on exam.  Her strength is also intact.

## 2024-06-10 NOTE — Assessment & Plan Note (Signed)
 Failed steroid shot.  Patient is not a candidate for NSAIDs due to being anticoagulated with Xarelto .  She is on 600 mg gabapentin  3 times daily without relief.  Due to her failing conservative measures.  Will continue with physical therapy at home per patient preference and initiate course of oxycodone .  Will provide 2.5 mg 15 tablets for 30 days. Suspect the patient may need to continue on narcotics due to level of pain and poor candidacy for surgery. Reassuringly she does not have any neurological signs on exam.  Her strength is also intact.

## 2024-07-14 ENCOUNTER — Ambulatory Visit (INDEPENDENT_AMBULATORY_CARE_PROVIDER_SITE_OTHER): Admitting: Family Medicine

## 2024-07-14 VITALS — BP 155/95 | HR 52 | Ht 64.0 in | Wt 197.8 lb

## 2024-07-14 DIAGNOSIS — M5416 Radiculopathy, lumbar region: Secondary | ICD-10-CM | POA: Diagnosis not present

## 2024-07-14 DIAGNOSIS — G609 Hereditary and idiopathic neuropathy, unspecified: Secondary | ICD-10-CM | POA: Diagnosis not present

## 2024-07-14 DIAGNOSIS — R2689 Other abnormalities of gait and mobility: Secondary | ICD-10-CM | POA: Diagnosis not present

## 2024-07-14 MED ORDER — ZIKS ARTHRITIS PAIN RELIEF 0.025-1-12 % EX CREA: 1.0000 | TOPICAL_CREAM | Freq: Every day | CUTANEOUS | 1 refills | Status: DC

## 2024-07-14 NOTE — Assessment & Plan Note (Signed)
 Chronic ongoing lumbar back pain, radiculopathic symptoms, lower extremity peripheral neuropathy, without new acute changes.  Recommended today to continue gabapentin , trial capsaicin cream, and ambulatory referral placed for evaluation of gait for DME.  Patient agreeable to plan.  Recommended to follow-up with Dr. Cleotilde in 1 month

## 2024-07-14 NOTE — Progress Notes (Signed)
    SUBJECTIVE:   CHIEF COMPLAINT / HPI: hip pain  Chronic history of back and hip pain. Known sciatica worse on right leg. Does have numbness and tingling in toes of right leg. Had trigger point injections with Dr. Cleotilde which helped for a couple of days. Continuing to walk regularly with dog. Tries to be not sedentary. Think she may need a walker. Taking Gabapentin . Unsure if still helping.  Did not try oxycodone . States this has been ongoing for a month. Worse in right food.  PERTINENT  PMH / PSH: History of breast cancer, spinal stenosis lumbar with neurogenic claudication, history of bariatric surgery, history of hereditary and idiopathic peripheral neuropathy  OBJECTIVE:   BP (!) 157/91   Pulse (!) 52   Ht 5' 4 (1.626 m)   Wt 197 lb 12.8 oz (89.7 kg)   SpO2 99%   BMI 33.95 kg/m   General: NAD, well appearing Neuro: A&O, positive right-sided Trendelenburg, slow antalgic gait Respiratory: normal WOB on RA Bilateral lower extremity: Subjective numbness and tingling along the bilateral lower extremity digits, objectively sensation intact, strength 5 out of 5 in all major muscle groups lower extremities  ASSESSMENT/PLAN:   Assessment & Plan Lumbar back pain with radiculopathy affecting right lower extremity Hereditary and idiopathic peripheral neuropathy Functional gait abnormality Chronic ongoing lumbar back pain, radiculopathic symptoms, lower extremity peripheral neuropathy, without new acute changes.  Recommended today to continue gabapentin , trial capsaicin cream, and ambulatory referral placed for evaluation of gait for DME.  Patient agreeable to plan.  Recommended to follow-up with Dr. Cleotilde in 1 month  Return if symptoms worsen or fail to improve.  Ozell Provencal, MD [facility]

## 2024-07-14 NOTE — Patient Instructions (Addendum)
 It was great to see you! Thank you for allowing me to participate in your care!  Our plans for today:  - You can try capsaicin cream to help with the numbness and tingling in your feet. - Please continue taking your gabapentin . - You may also try the oxycodone  Dr. Cleotilde sent to your pharmacy for pain. - I have placed a referral to physical therapy, they should call you to schedule an appointment to evaluate your walking.   Please arrive 15 minutes PRIOR to your next scheduled appointment time! If you do not, this affects OTHER patients' care.  Take care and seek immediate care sooner if you develop any concerns.   Christina Provencal, MD, PGY-3 [facility] 11:00 AM 07/14/2024  [facility]

## 2024-07-16 ENCOUNTER — Other Ambulatory Visit: Payer: Self-pay | Admitting: Student

## 2024-07-16 DIAGNOSIS — B379 Candidiasis, unspecified: Secondary | ICD-10-CM

## 2024-07-28 ENCOUNTER — Ambulatory Visit: Admitting: Family Medicine

## 2024-07-28 VITALS — BP 122/83 | HR 80 | Temp 97.9°F | Ht 64.0 in | Wt 193.2 lb

## 2024-07-28 DIAGNOSIS — R159 Full incontinence of feces: Secondary | ICD-10-CM

## 2024-07-28 DIAGNOSIS — J069 Acute upper respiratory infection, unspecified: Secondary | ICD-10-CM | POA: Diagnosis not present

## 2024-07-28 NOTE — Progress Notes (Unsigned)
    SUBJECTIVE:   CHIEF COMPLAINT / HPI:   URI  2-3 days ago started having feverish symptoms with lack of appetite. She has been coughing since then. She had a headache 2 days ago that has since improved.  No vision changes. No nausea, vomiting.  No sick contacts.  No mental status changes in the last few days.  No shortness of breath.  Has had one episode of fecal incontinence today. No blood. BM before that was several days ago. Patient has history of fecal incontinence with constipation and has been seen by GI previously. Supposed to have a follow up in September for colonoscopy after having cardiac evaluation by cardiologist in September as well. Mentioned she has not been taking ex lax as was recommended by GI during that visit. Son had not yet been taking care of her/ going with her to appointments at that point, before dementia had worsened.     PERTINENT  PMH / PSH: Mixed incontinence  OBJECTIVE:   BP 122/83   Pulse 80   Temp 97.9 F (36.6 C) (Oral)   Ht 5' 4 (1.626 m)   Wt 193 lb 3.2 oz (87.6 kg)   SpO2 99%   BMI 33.16 kg/m   General: well appearing, in no acute distress HEENT: no conjunctivitis, rhinitis, no cervical lymphadenopathy  CV: RRR, radial pulses equal and palpable Resp: Normal work of breathing on room air, CTAB Abd: Soft, non tender, non distended  Neuro: Alert & Oriented x 4, occasionally gives incorrect details with timeline errors (corrected by her son)    ASSESSMENT/PLAN:   Assessment & Plan Viral upper respiratory tract infection Symptoms consistent with viral URI. No signs of pneumonia on exam and patient otherwise well hydrating and tolerating fluids.  - counseled regarding conservative treatments for URI symptoms  - Follow up if patient does not improve.  Incontinence of feces, unspecified fecal incontinence type Episode of incontinence most likely due to history of constipation and encopresis rather than diarrhea due to virus.  -  Recommended patient trial the ex lax as previously recommended  - Advised patient to call GI office to set up appoint as there appears to be no appointment made yet in the chart.  - Follow up if does not improve.      Areta Saliva, MD [facility]

## 2024-07-28 NOTE — Patient Instructions (Signed)
 It was wonderful to see you today.  Please bring ALL of your medications with you to every visit.   Today we talked about:  Upper respiratory infection - I believe the cough and lack of appetite recently is due to an upper respiratory infection like a cold. Avoid over the counter cold and cough medicines as some of them can make you more dehydrated and are not as good for people of your age. Please stay hydrated by having lots of liquids and soups. You can use over the counter cough drops. If you start to have worsening shortness of breath or cough or fever please let us  know.    For your fecal incontinence (accident) I believe this could be related to your constipation. You were supposed to have a follow up with your Gastroenterologist in September. Please call their office to schedule this. When you are feeling better after your cold resume taking ex-lax to have a bowel movement at least 3 times a week.    Thank you for choosing [facility].   Please call [phone] with any questions about today's appointment.  Areta Saliva, MD  Family Medicine

## 2024-07-29 ENCOUNTER — Other Ambulatory Visit (HOSPITAL_BASED_OUTPATIENT_CLINIC_OR_DEPARTMENT_OTHER)

## 2024-08-10 ENCOUNTER — Telehealth: Payer: Self-pay

## 2024-08-10 NOTE — Telephone Encounter (Signed)
 Patient calls nurse line requesting an apt.   She reports she feels she has neuropathy. She reports for 3 weeks she has experienced numbness and tingling of her feet. She reports the symptoms are the worst at night.   She reports she has been taking Tylenol , however minimal relief.  She denies any weakness or trouble ambulating.   Patient scheduled for tomorrow for evaluation.

## 2024-08-11 ENCOUNTER — Encounter: Payer: Self-pay | Admitting: Student

## 2024-08-11 ENCOUNTER — Ambulatory Visit (INDEPENDENT_AMBULATORY_CARE_PROVIDER_SITE_OTHER): Admitting: Student

## 2024-08-11 VITALS — BP 142/85 | HR 55 | Temp 98.4°F | Wt 193.8 lb

## 2024-08-11 DIAGNOSIS — G2581 Restless legs syndrome: Secondary | ICD-10-CM | POA: Diagnosis not present

## 2024-08-11 DIAGNOSIS — M25551 Pain in right hip: Secondary | ICD-10-CM | POA: Diagnosis not present

## 2024-08-11 DIAGNOSIS — G609 Hereditary and idiopathic neuropathy, unspecified: Secondary | ICD-10-CM | POA: Diagnosis not present

## 2024-08-11 MED ORDER — IRON 325 (65 FE) MG PO TABS: 325.0000 mg | ORAL_TABLET | Freq: Every day | ORAL | 6 refills | Status: DC

## 2024-08-11 NOTE — Progress Notes (Signed)
    SUBJECTIVE:   CHIEF COMPLAINT / HPI:   73 year old female history of PAF, CAD, restless leg syndrome and peripheral neuropathy.   Presenting today for worsening BLE numbness in her toes in the last few weeks Denies any LE weakness, saddle paresthesia or incontinence  No recent trauma to the back. Endorses history of back pain due to spinal stenosis but hasn't gotten worse In addition report worsening bilateral calf pain mostly at night Pain can be serious enough to make her cry No recent travel and not taking any med Been told in the past she should be on iron  supplement but unsure why She denies any know history of iron  allergy  PERTINENT  PMH / PSH: Reviewed   OBJECTIVE:   BP (!) 142/85   Pulse (!) 55   Temp 98.4 F (36.9 C)   Wt 193 lb 12.8 oz (87.9 kg)   SpO2 100%   BMI 33.27 kg/m    Physical Exam General: Alert, well appearing, NAD Cardiovascular: RRR, No Murmurs, Normal S2/S2 Respiratory: CTAB, No wheezing or Rales Abdomen: No distension or tenderness Extremities: Normal strength, +2 pulse and warm feet. Diminished sensation but able to feel touch. No notable lesions.   ASSESSMENT/PLAN:   Hereditary and idiopathic peripheral neuropathy Currently on high-dose gabapentin  and however still having significant neuropathy.  X-ray showed degenerative disease but no other possible etiologies.  Recent labs including RPR, HIV were all negative.  Given persistence of symptoms will be appropriate to obtain an MRI to rule out other possible etiologies. - Placed order for lumbar spine MRI, patient will be called to schedule appointment once insurance approves. - Encouraged patient to continue gabapentin  as prescribed - Strict return precaution discussed with patient  Restless leg syndrome Worsening bilateral calf pain mostly at night very suspicious of restless leg syndrome.  Currently not on any medication and most recent iron  panel shows low ferritin and iron  levels.  Per  chart review patient has previously been on iron  but appears to have discontinued based on patient's preference.  Patient denies any history of allergy to iron , will restart patient on iron  supplement. - Rx iron  325 mg daily - Could consider pramipexole  if no improvement in a month   Norleen April, MD [facility]

## 2024-08-11 NOTE — Assessment & Plan Note (Signed)
 Worsening bilateral calf pain mostly at night very suspicious of restless leg syndrome.  Currently not on any medication and most recent iron  panel shows low ferritin and iron  levels.  Per chart review patient has previously been on iron  but appears to have discontinued based on patient's preference.  Patient denies any history of allergy to iron , will restart patient on iron  supplement. - Rx iron  325 mg daily - Could consider pramipexole  if no improvement in a month

## 2024-08-11 NOTE — Assessment & Plan Note (Signed)
 Currently on high-dose gabapentin  and however still having significant neuropathy.  X-ray showed degenerative disease but no other possible etiologies.  Recent labs including RPR, HIV were all negative.  Given persistence of symptoms will be appropriate to obtain an MRI to rule out other possible etiologies. - Placed order for lumbar spine MRI, patient will be called to schedule appointment once insurance approves. - Encouraged patient to continue gabapentin  as prescribed - Strict return precaution discussed with patient

## 2024-08-11 NOTE — Patient Instructions (Signed)
 Pleasure to see you today.  Suspect the leg pain you have at night is most likely consistent with restless leg syndrome.  Your recent labs showed that your ferritin and iron  levels were low.  I have sent in prescription for iron  to take the 25 mg daily.  Also please continue to take your gabapentin  as prescribed.  Given that this has been persistent despite being on high doses I have placed an order for a lumbar MRI.  Once approved by insurance you will get a call to schedule an appointment.

## 2024-08-12 ENCOUNTER — Ambulatory Visit: Attending: Cardiology | Admitting: Cardiology

## 2024-08-13 ENCOUNTER — Encounter: Payer: Self-pay | Admitting: Cardiology

## 2024-08-24 ENCOUNTER — Ambulatory Visit (INDEPENDENT_AMBULATORY_CARE_PROVIDER_SITE_OTHER): Admitting: Student

## 2024-08-24 ENCOUNTER — Encounter: Payer: Self-pay | Admitting: Student

## 2024-08-24 VITALS — BP 140/88 | HR 87 | Ht 64.0 in | Wt 192.4 lb

## 2024-08-24 DIAGNOSIS — M25551 Pain in right hip: Secondary | ICD-10-CM

## 2024-08-24 DIAGNOSIS — I10 Essential (primary) hypertension: Secondary | ICD-10-CM | POA: Diagnosis not present

## 2024-08-24 DIAGNOSIS — M5416 Radiculopathy, lumbar region: Secondary | ICD-10-CM

## 2024-08-24 MED ORDER — GABAPENTIN 100 MG PO CAPS: 100.0000 mg | ORAL_CAPSULE | Freq: Three times a day (TID) | ORAL | 1 refills | Status: AC

## 2024-08-24 MED ORDER — SPIRONOLACTONE 25 MG PO TABS: 25.0000 mg | ORAL_TABLET | Freq: Every day | ORAL | 3 refills | Status: DC

## 2024-08-24 NOTE — Patient Instructions (Addendum)
 It was great to see you today!   I am refilling gabapentin  100 mg. Starting taking this once daily at night. You can increase this medication to three times per day.   I am sending in a new prescription called Spironolactone . This is to help with your blood pressure.   Come back in 1 week to check you electrolytes.   Future Appointments  Date Time Provider Department Center  08/27/2024 10:00 AM [facility] NURSE [facility]  08/27/2024 10:30 AM [facility] LAB [facility]  10/21/2024 10:40 AM Tolia, Sunit, DO [facility]  03/25/2025 10:30 AM [facility] ANNUAL WELLNESS VISIT [facility]    Please arrive 15 minutes before your appointment to ensure smooth check in process.  If you are more than 15 minutes late, you may be asked to reschedule.   Please bring a list of your medications with you to all appointments.   Please call the clinic at [phone] if your symptoms worsen or you have any concerns.  Thank you for allowing me to participate in your care, Dr. Damien Pinal [facility]

## 2024-08-24 NOTE — Assessment & Plan Note (Signed)
 BP not at goal <130/80  Patient at higher risk with hx of thoracic aortic aneurysm  Continue losartan  100 mg and Toprol  25 mg daily  Add spironolactone  25 mg daily for added BP control and heart benefits  Reviewed previous BMP with normal potassium  Patient to return in 3 days for BMP and blood pressure recheck

## 2024-08-24 NOTE — Progress Notes (Signed)
    SUBJECTIVE:   CHIEF COMPLAINT / HPI:   Christina Hardin is a 73 y.o. female presenting for blood pressure follow up .   I reviewed previous office notes. Last seen on 08/11/24 with BP 142/85.  I reviewed previous lab results. Last BMP 7 months ago with normal renal function and electrolytes.   HTN: currently on losartan  100 mg and metoprolol  25 mg  On review of prior office visits, patient has had several elevated readings SBP >140   PERTINENT  PMH / PSH: reviewed and updated.  OBJECTIVE:   BP (!) 140/88   Pulse 87   Ht 5' 4 (1.626 m)   Wt 192 lb 6.4 oz (87.3 kg)   SpO2 98%   BMI 33.03 kg/m   Well-appearing, no acute distress Cardio: Regular rate, regular rhythm, no murmurs on exam. Pulm: Clear, no wheezing, no crackles. No increased work of breathing Abdominal: bowel sounds present, soft, non-tender, non-distended Extremities: no peripheral edema  Neuro: alert and oriented x3, speech normal in content, no facial asymmetry, strength intact and equal bilaterally in UE and LE, pupils equal and reactive to light.  Psych:  Cognition and judgment appear intact. Alert, communicative  and cooperative with normal attention span and concentration. No apparent delusions, illusions, hallucinations    ASSESSMENT/PLAN:   Assessment & Plan Essential hypertension BP not at goal <130/80  Patient at higher risk with hx of thoracic aortic aneurysm  Continue losartan  100 mg and Toprol  25 mg daily  Add spironolactone  25 mg daily for added BP control and heart benefits  Reviewed previous BMP with normal potassium  Patient to return in 3 days for BMP and blood pressure recheck  Right hip pain Refilled gabapentin  Reduced dose to 100 mg since patient has not been taking this recently      Damien Pinal, DO [facility]

## 2024-08-25 ENCOUNTER — Encounter: Payer: Self-pay | Admitting: Gastroenterology

## 2024-08-27 ENCOUNTER — Ambulatory Visit: Payer: Self-pay

## 2024-08-27 ENCOUNTER — Other Ambulatory Visit: Payer: Self-pay

## 2024-08-31 ENCOUNTER — Ambulatory Visit: Payer: Self-pay | Admitting: Student

## 2024-09-01 ENCOUNTER — Ambulatory Visit: Admitting: Pharmacist

## 2024-09-01 ENCOUNTER — Other Ambulatory Visit

## 2024-09-01 VITALS — BP 112/79 | HR 63

## 2024-09-01 DIAGNOSIS — Z79899 Other long term (current) drug therapy: Secondary | ICD-10-CM | POA: Diagnosis not present

## 2024-09-01 DIAGNOSIS — Z23 Encounter for immunization: Secondary | ICD-10-CM

## 2024-09-01 DIAGNOSIS — I10 Essential (primary) hypertension: Secondary | ICD-10-CM | POA: Diagnosis not present

## 2024-09-01 DIAGNOSIS — F03A0 Unspecified dementia, mild, without behavioral disturbance, psychotic disturbance, mood disturbance, and anxiety: Secondary | ICD-10-CM

## 2024-09-01 MED ORDER — SPIRONOLACTONE 25 MG PO TABS: 25.0000 mg | ORAL_TABLET | Freq: Every day | ORAL | Status: DC

## 2024-09-01 NOTE — Progress Notes (Signed)
    S:     Chief Complaint  Patient presents with   Medication Management    Polypharmacy work-in visit   73 y.o. who presents for medication management due to concern of polypharmacy.  Patient arrives in good spirits and presents without assistance.  Reports some concerns with cognitive functioning - had some testing done recently.   PMH is significant for hypertension, Anemia, RLS, AFIB, neuropathy, history of breast cancer.   Insurance Coverage: [facility] Medicare  Questions with gabapentin  dosing -- at recent visit with Dr. CHARLENA Pinal dose was reduced from 300 mg 2 tablets TID to 100 mg TID -- patient had confusion with this dosing and has been taking both gabapentin  doses.  Reports that higher dose made her feel bad - sick and kept her awake  Her neuropathy is improved with something that she ordered online -- but the people at home stress her out about her gabapentin  dose.   Reports diarrhea occasionally in the AM/PM -- potentially related to new iron  supplement   Do you know what each of your medicines is for? No -- in AVS wrote out what all meds are indicated for Do you feel that your medications are working for you? yes Have you been experiencing any side effects to the medications prescribed? no Do you ever have trouble remembering to take your medicine? Yes - her spironolactone  she occasionally forgets to take since it is written for at bedtime -- discussed that she can take this any time of day convenient for her How many days in the past week did you miss taking your medicines? None Do you ever not take a medicine because you feel you do not need it? no Do you have any problems obtaining medications due to finances? no Do you have any problems obtaining medications due to transportation? no  O:  Review of Systems  Skin:  Positive for rash.  All other systems reviewed and are negative.   Physical Exam Vitals reviewed.  Constitutional:      Appearance: Normal  appearance.  Pulmonary:     Effort: Pulmonary effort is normal.  Neurological:     Mental Status: She is alert.  Psychiatric:        Mood and Affect: Mood normal.        Behavior: Behavior normal.        Thought Content: Thought content normal.        Judgment: Judgment normal.    Vitals:   09/01/24 1405  BP: 112/79  Pulse: 63  SpO2: 98%    A/P: Polypharmacy - Medication Reconciliation - Medication list reviewed and updated.   Understanding of regimen: fair -- discussed correct dosing of gabapentin  (100 mg TID rather than 300 mg 2 tabs TID), adjusted spironolactone  dosing from at bedtime to every day for patient ease Understanding of indications: fair -- in AVS wrote out indications for each medication Potential of compliance: good - Patient has confusion with regimen - discussed use of pillbox if she thinks it will be beneficial Patient has known adherence challenges based on above barriers, including lack of knowledge, forgetfulness  Written patient instructions and updated medication list provided. Total time in face to face counseling 14 minutes.    Patient seen with Estefana Blase, PharmD Candidate - PY4 student.

## 2024-09-01 NOTE — Assessment & Plan Note (Signed)
 Polypharmacy - Medication Reconciliation - Medication list reviewed and updated.   Understanding of regimen: fair -- discussed correct dosing of gabapentin  (100 mg TID rather than 300 mg 2 tabs TID), adjusted spironolactone  dosing from at bedtime to every day for patient ease Understanding of indications: fair -- in AVS wrote out indications for each medication Potential of compliance: good - Patient has confusion with regimen - discussed use of pillbox if she thinks it will be beneficial Patient has known adherence challenges based on above barriers, including lack of knowledge, forgetfulness

## 2024-09-01 NOTE — Patient Instructions (Addendum)
 It was nice to see you today!  Medication Changes: Please take your gabapentin  as 100 mg tablet (1 tablet) 3 times a day -- DO NOT take your 300 mg dose - Your rosuvastatin  is for cholesterol - Your losartan , metoprolol , spironolactone  are for your blood pressure - Your rivaroxaban  is for your atrial fibrillation - Your gabapentin  is for neuropathy - Your iron  is a supplement

## 2024-09-02 ENCOUNTER — Ambulatory Visit: Payer: Self-pay | Admitting: Student

## 2024-09-02 LAB — BASIC METABOLIC PANEL WITH GFR
BUN/Creatinine Ratio: 10 — ABNORMAL LOW (ref 12–28)
BUN: 11 mg/dL (ref 8–27)
CO2: 21 mmol/L (ref 20–29)
Calcium: 10.9 mg/dL — ABNORMAL HIGH (ref 8.7–10.3)
Chloride: 108 mmol/L — ABNORMAL HIGH (ref 96–106)
Creatinine, Ser: 1.07 mg/dL — ABNORMAL HIGH (ref 0.57–1.00)
Glucose: 76 mg/dL (ref 70–99)
Potassium: 3.9 mmol/L (ref 3.5–5.2)
Sodium: 142 mmol/L (ref 134–144)
eGFR: 55 mL/min/1.73 — ABNORMAL LOW (ref >59)

## 2024-09-02 NOTE — Progress Notes (Signed)
 Reviewed and agree with Dr Rennis plan.

## 2024-09-16 ENCOUNTER — Other Ambulatory Visit: Payer: Medicare Other

## 2024-09-25 ENCOUNTER — Ambulatory Visit (INDEPENDENT_AMBULATORY_CARE_PROVIDER_SITE_OTHER): Admitting: Family Medicine

## 2024-09-25 VITALS — BP 101/79 | HR 70 | Ht 64.0 in | Wt 184.6 lb

## 2024-09-25 DIAGNOSIS — R5383 Other fatigue: Secondary | ICD-10-CM | POA: Diagnosis not present

## 2024-09-25 DIAGNOSIS — I959 Hypotension, unspecified: Secondary | ICD-10-CM

## 2024-09-25 MED ORDER — RIVAROXABAN 20 MG PO TABS
20.0000 mg | ORAL_TABLET | Freq: Every day | ORAL | 0 refills | Status: AC
Start: 2024-09-25 — End: ?

## 2024-09-25 NOTE — Patient Instructions (Addendum)
 It was great to see you today! Thank you for choosing [facility] for your primary care. Christina Hardin was seen for fatigue and low blood pressure.  Today we addressed: Fatigue / Low Blood Pressure: This could be secondary to you starting another blood pressure medication recently called spironolactone , along with a decreased appetite, and weight loss.  We checked orthostatic vital signs on you today to see what your blood pressure did in different positions and it was positive meaning your pressure dropped over the threshold that we would like when we compared your standing and laying pressures. We DISCONTINUED your spironolactone  today.  We also discussed doing some lab work today to reassess your blood count and electrolytes, but you would prefer not to at this time since we have no lab available here today.  This can be something that can be considered at the next appointment to investigate your weight loss further.  Please RESTART Xarelto  as prescribed if you are able to tolerate a whole meal with the Xarelto .  If increasing your appetite it becomes a problem then we will likely have to switch you to Eliquis .  You should return to our clinic Return in about 5 days (around 09/30/2024). Please arrive 15 minutes before your appointment to ensure smooth check in process.  We appreciate your efforts in making this happen.  Thank you for allowing me to participate in your care, Kathrine Melena, DO 09/25/2024, 2:18 PM PGY-2, [facility]

## 2024-09-25 NOTE — Progress Notes (Signed)
    SUBJECTIVE:   CHIEF COMPLAINT / HPI:   Fatigue/Low BP - Last few days she hasn't been feeling well, hasn't been eating or sleeping well because she feels lightheaded and weak mainly at night - Checked her BP for the past three nights and it was low, on a wrist cuff < 120/80 - Lost 8 pounds since her last visit 9/22, she reports eating less because she has decreased appetite, but has been staying hydrated - Thinks she may have missed some doses of Xarelto  since this was not in her medication bag at this time, but unsure how long; she has taken all of her medications today except her nighttime doses otherwise - Denies any chest pain or worsened SOB from her baseline and no palpitations at this time, no N/V or abdominal pain or any signs of bleeding, however reports dark stool likely due to being on iron  supplementation - No sick contacts, fevers, chills, but has mild body aches, hip pains, and neuropathy persists.  PERTINENT  PMH / PSH: Thoracic aortic aneurysm, HTN, A-fib, diastolic heart failure, breast cancer, recurrent UTI  OBJECTIVE:   BP 101/79   Pulse 70   Ht 5' 4 (1.626 m)   Wt 184 lb 9.6 oz (83.7 kg)   SpO2 99%   BMI 31.69 kg/m   General: Awake and Alert in NAD HEENT: NCAT. Sclera anicteric. No rhinorrhea. Cardiovascular: RRR. No M/R/G Respiratory: CTAB, normal WOB on RA. No wheezing, crackles, rhonchi, or diminished breath sounds. Abdomen: Soft, non-tender, non-distended. Bowel sounds normoactive Extremities: Able to move all extremities. No BLE edema, no deformities or significant joint findings. Skin: Warm and dry. No abrasions or rashes noted. Neuro: No focal neurological deficits.  ASSESSMENT/PLAN:   Assessment & Plan Hypotension, unspecified hypotension type Patient has symptomatic hypotension over the last 3 days.  Orthostatics positive today: laying 109/51, sitting 104/77, and standing 106/82. - Discontinued spironolactone  25 mg daily, which was recently  started on 9/22 - Advised patient to check her blood pressure at home - Follow-up next week with Dr. Cleotilde to recheck BP Fatigue, unspecified type Likely secondary to low BP, however patient has also had significant weight loss recently from 200 lbs on 06/2024, now down to 184 pounds 09/2024, since last month she is down 8 pounds.  Reportedly has decreased appetite that is also new, but no fevers, chills, or night sweats. - Considered doing lab work today, however since our lab was not an operation, patient did not want to go elsewhere.  Could consider workup with CBC, CMP and ESR at next visit - Recommending PCP follow-up on colonoscopy which was done 12 years ago (2013) demonstrating a polyp which was removed at next visit   Christina Melena, DO [facility]

## 2024-09-30 ENCOUNTER — Ambulatory Visit: Payer: Self-pay | Admitting: Student

## 2024-10-05 ENCOUNTER — Ambulatory Visit: Admitting: Student

## 2024-10-07 ENCOUNTER — Ambulatory Visit: Admitting: Family Medicine

## 2024-10-07 ENCOUNTER — Encounter: Payer: Self-pay | Admitting: Family Medicine

## 2024-10-07 VITALS — BP 116/76 | HR 64 | Ht 64.5 in | Wt 183.0 lb

## 2024-10-07 DIAGNOSIS — G609 Hereditary and idiopathic neuropathy, unspecified: Secondary | ICD-10-CM | POA: Diagnosis not present

## 2024-10-07 NOTE — Patient Instructions (Signed)
 Thank you for coming in today! Here is a summary of what we discussed:  Let's just stick with the same medications you're on for now. You have refills on everything- please message Dr Cleotilde if you need anything in the next 30 days. We wish you all the best!  Please call the clinic at [phone] if your symptoms worsen or you have any concerns.  Best, Dr Adele

## 2024-10-07 NOTE — Progress Notes (Unsigned)
    SUBJECTIVE:   CHIEF COMPLAINT / HPI:   Peripheral neuropathy f/u --gabapentin  reduced from 300mg  BID to 100mg  TID in late Sept- saw Koval and he continued gapabentin 100 TID --negative imaging and workup --MR L spine ordered- not scheduled yet? *** --things are better, still has occasional burning/tingling in feet and up lower legs  RLS --rx'd iron  in Sept  PERTINENT  PMH / PSH: ***  OBJECTIVE:   BP 116/76   Pulse 64   Ht 5' 4.5 (1.638 m)   Wt 183 lb (83 kg)   SpO2 97%   BMI 30.93 kg/m   ***  ASSESSMENT/PLAN:   Assessment & Plan      Christina Raring, MD [facility]

## 2024-10-08 NOTE — Assessment & Plan Note (Signed)
 Stable and improved since last visit. Continued 100mg  gabapentin  TID. Has refills on file. Advised that we can provide meds for 30 days as she transitions to other clinic. Advised discussing L spine imaging with new PCP as desired.

## 2024-10-11 ENCOUNTER — Other Ambulatory Visit: Payer: Self-pay | Admitting: Student

## 2024-10-11 DIAGNOSIS — I480 Paroxysmal atrial fibrillation: Secondary | ICD-10-CM

## 2024-10-11 DIAGNOSIS — I10 Essential (primary) hypertension: Secondary | ICD-10-CM

## 2024-10-12 ENCOUNTER — Ambulatory Visit (INDEPENDENT_AMBULATORY_CARE_PROVIDER_SITE_OTHER)

## 2024-10-12 VITALS — BP 143/82 | HR 72 | Temp 97.8°F | Ht 64.5 in | Wt 183.1 lb

## 2024-10-12 DIAGNOSIS — I480 Paroxysmal atrial fibrillation: Secondary | ICD-10-CM

## 2024-10-12 DIAGNOSIS — D508 Other iron deficiency anemias: Secondary | ICD-10-CM

## 2024-10-12 DIAGNOSIS — Z7689 Persons encountering health services in other specified circumstances: Secondary | ICD-10-CM

## 2024-10-12 DIAGNOSIS — I10 Essential (primary) hypertension: Secondary | ICD-10-CM

## 2024-10-12 DIAGNOSIS — Z1211 Encounter for screening for malignant neoplasm of colon: Secondary | ICD-10-CM

## 2024-10-12 DIAGNOSIS — M5416 Radiculopathy, lumbar region: Secondary | ICD-10-CM

## 2024-10-12 DIAGNOSIS — I7121 Aneurysm of the ascending aorta, without rupture: Secondary | ICD-10-CM

## 2024-10-12 DIAGNOSIS — F03A0 Unspecified dementia, mild, without behavioral disturbance, psychotic disturbance, mood disturbance, and anxiety: Secondary | ICD-10-CM

## 2024-10-12 DIAGNOSIS — E66811 Obesity, class 1: Secondary | ICD-10-CM

## 2024-10-12 DIAGNOSIS — I2510 Atherosclerotic heart disease of native coronary artery without angina pectoris: Secondary | ICD-10-CM | POA: Diagnosis not present

## 2024-10-12 DIAGNOSIS — Z23 Encounter for immunization: Secondary | ICD-10-CM | POA: Diagnosis not present

## 2024-10-12 DIAGNOSIS — E6609 Other obesity due to excess calories: Secondary | ICD-10-CM

## 2024-10-12 MED ORDER — ZIKS ARTHRITIS PAIN RELIEF 0.025-1-12 % EX CREA: 1.0000 | TOPICAL_CREAM | Freq: Every day | CUTANEOUS | 1 refills | Status: DC

## 2024-10-12 NOTE — Assessment & Plan Note (Signed)
 Rechecking CBC and iron  panel today.  Continue ferrous sulfate  325 mg supplement daily for now.

## 2024-10-12 NOTE — Assessment & Plan Note (Signed)
 Provided patient with an updated medication list including names of medications, dosing, when to take them, and also hand wrote what she is taking each medication for.  Patient is also following with registered pharmacist for polypharmacy management.  Gabapentin  dose has been corrected from 300 mg to 100 mg 3 times daily.  Patient may benefit from use of pillbox or having her medications mailed to her and [facility]  Patient's 2 sons live with her and help her manage her medications and her appointments.  Will continue to monitor.

## 2024-10-12 NOTE — Assessment & Plan Note (Signed)
 Chronic ongoing lumbar back pain, radiculopathic symptoms, lower extremity peripheral neuropathy, without new acute changes. Recommended today to continue gabapentin  100 mg TID. Patient never picked up capcaicin cream prescribed by last PCP. New Rx sent in today.  Previous treatments include steroid injection and  oxycodone . Given patient's previous diagnosis of mild dementia, would not recommend oxycodone  or other narcotics at this time.  If this proves ineffective for pain, consider referral to pain management.  Provided patient with [facility] phone number to call and schedule her lumbar MRI that was ordered back in September.

## 2024-10-12 NOTE — Patient Instructions (Addendum)
 VISIT SUMMARY: Today, you came in for a wellness visit and to discuss your chronic back pain. We reviewed your cardiovascular health, blood pressure, appetite changes, and medication regimen. We also addressed your anemia and provided a tetanus vaccine.  YOUR PLAN: CHRONIC BACK PAIN WITH LEFT-SIDED SCIATICA AND NUMBNESS: You have chronic back pain that radiates to your hip and sometimes to the right side, with associated numbness in your toes. -Continue using the capsaicin cream for pain relief . -Schedule an MRI of your lower back using the provided phone number. -Continue taking gabapentin  at the current dosage (100 mg three times a day)  Please call [facility] to get the appointment set up for MRI of your spine:  [facility] [address] [place], [place] [address] Phone [phone]   PAROXYSMAL ATRIAL FIBRILLATION: You have a history of atrial fibrillation and are currently taking Xarelto  and metoprolol . -Continue taking Xarelto  and metoprolol  as prescribed. -Follow up with cardiology on November 19th.  HYPERTENSION: Your blood pressure readings have been slightly elevated. -Continue taking metoprolol  and losartan  as prescribed. -We rechecked your blood pressure before you left the office. - STOP taking the spironolactone   HYPERLIPIDEMIA: You have high cholesterol levels. -Continue taking rosuvastatin  20 mg daily.  ANEMIA, UNSPECIFIED: You have a history of anemia and are experiencing fatigue and poor appetite. -We ordered blood work to check your current blood counts. -Continue taking your iron  supplement as prescribed   GENERAL HEALTH MAINTENANCE: Routine wellness visit and preventive care. -You received a tetanus vaccine today. -We ordered blood work including A1c, blood counts, kidney, and liver function tests. -We provided you with an updated medication list.  If you have any problems before your next visit feel free to message me via MyChart (minor issues  or questions) or call the office, otherwise you may reach out to schedule an office visit.  Thank you! Saddie Sacks, PA-C

## 2024-10-12 NOTE — Progress Notes (Signed)
 New Patient Office Visit  Subjective    Patient ID: Christina Hardin, female    DOB: 1950/12/22  Age: 73 y.o. MRN: [id number]  CC:  Chief Complaint  Patient presents with   New Patient (Initial Visit)    Discussed the use of AI scribe software for clinical note transcription with the patient, who gave verbal consent to proceed.  History of Present Illness   Christina Hardin is a 73 year old female who presents for establishing care and evaluation of chronic back pain.  Chronic back pain and neuropathic symptoms - Chronic back pain primarily on the left side, radiating to the hip and occasionally to the right side - Associated numbness in toes - Pain is manageable at night and does not significantly disturb sleep - Previous steroid injection provided no relief - Unable to take anti-inflammatories due to Xarelto  use - Not participating in physical therapy due to logistical challenges - Tried low dose oxycodone  but hesitant to continue due to concerns about addiction - Prescribed capsaicin cream but has not yet obtained it  Notes from previous PCP:   Per previous PCP on 06/10/2024:  Failed steroid shot. Patient is not a candidate for NSAIDs due to being anticoagulated with Xarelto . She is on 600 mg gabapentin  3 times daily without relief. Due to her failing conservative measures. Will continue with physical therapy at home per patient preference and initiate course of oxycodone . Will provide 2.5 mg 15 tablets for 30 days. Suspect the patient may need to continue on narcotics due to level of pain and poor candidacy for surgery. Reassuringly she does not have any neurological signs on exam. Her strength is also intact.   07/18/24:  Chronic ongoing lumbar back pain, radiculopathic symptoms, lower extremity peripheral neuropathy, without new acute changes.  Recommended today to continue gabapentin , trial capsaicin cream, and ambulatory referral placed for evaluation of gait for DME.   Patient agreeable to plan.  Did not try oxycodone .  Recommended to follow-up with Dr. Cleotilde in 1 month   Cardiovascular symptoms and blood pressure concerns - History of atrial fibrillation, currently taking Xarelto  and metoprolol  - Upcoming cardiology appointment later this month - Home blood pressure readings sometimes elevated, with a recent reading of 139/92 today in office - Has not had her blood pressure medication today   Medication changes and side effects - Gabapentin  dose reduced from 300 mg three times daily to 100 mg, due to polypharmacy  - Previously on spironolactone , now discontinued by previous PCP due to episodes of hypotension - Takes rosuvastatin  20 mg daily  Anemia - History of anemia - Currently taking an iron  supplement prescribed by previous PCP      Screenings:  Colon Cancer: Due; order placed today Lung Cancer: NA Breast Cancer: UTD Diabetes: Checking A1c with labs  HLD: Checking lipid panel with labs  The 10-year ASCVD risk score (Arnett DK, et al., 2019) is: 20.3%   Outpatient Encounter Medications as of 10/12/2024  Medication Sig   Capsaicin-Menthol-Methyl Sal (CAPSAICIN-METHYL SAL-MENTHOL) 0.025-1-12 % CREA Apply 1 Application topically daily.   Cyanocobalamin 2500 MCG CHEW Chew 2,500 mcg by mouth daily. Vitamin B12   gabapentin  (NEURONTIN ) 100 MG capsule Take 1 capsule (100 mg total) by mouth 3 (three) times daily.   losartan  (COZAAR ) 100 MG tablet TAKE 1 TABLET BY MOUTH EVERY DAY   metoprolol  succinate (TOPROL -XL) 25 MG 24 hr tablet TAKE 1 TABLET (25 MG TOTAL) BY MOUTH DAILY.   rivaroxaban  (XARELTO ) 20 MG  TABS tablet Take 1 tablet (20 mg total) by mouth daily with supper.   rosuvastatin  (CRESTOR ) 20 MG tablet Take 1 tablet (20 mg total) by mouth daily.   [DISCONTINUED] Ferrous Sulfate  (IRON ) 325 (65 Fe) MG TABS Take 1 tablet (325 mg total) by mouth daily.   [DISCONTINUED] losartan  (COZAAR ) 100 MG tablet Take 1 tablet (100 mg total) by mouth  daily.   [DISCONTINUED] metoprolol  succinate (TOPROL -XL) 25 MG 24 hr tablet Take 1 tablet (25 mg total) by mouth daily.   No facility-administered encounter medications on file as of 10/12/2024.    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     History of breast cancer 11/16/2016   History of colon polyps    sessile polyp 2013   NEPHROLITHIASIS 09/17/2008   Qualifier: Diagnosis of   By: Scarlet MD, Elsie         Personal history of chemotherapy 2017   Recurrent UTI 03/10/2024   Reflux esophagitis 01/30/2007   Qualifier: Diagnosis of   By: Jil MD, Josette RAMAN        Renal cyst, right 09/17/2008   Qualifier: Diagnosis of   By: Scarlet MD, Elsie         Rosacea 01/30/2007   Qualifier: Diagnosis of   By: Jil MD, Josette RAMAN        S/P drug eluting coronary stent placement 03/13/2024   [place] CCTA 10/05/2021: Mild to moderate (50-60% diameter stenosis), single discreet stenosis of distal RCA. Minimal luminal narrowing of the LAD arteryCardiac catheterization 10/30/2021: DES to RCA, EF 60%, LVEDP 10 mmHg     Thoracic aortic  aneurysm    Thoracic aortic aneurysm     Past Surgical History:  Procedure Laterality Date   ABDOMINAL HYSTERECTOMY     APPENDECTOMY  1958   BLADDER REPAIR     BREAST BIOPSY Left    BREAST LUMPECTOMY Left 10/2016   BREAST LUMPECTOMY WITH RADIOACTIVE SEED AND SENTINEL LYMPH NODE BIOPSY Left 10/09/2016   Procedure: BREAST LUMPECTOMY WITH RADIOACTIVE SEED AND SENTINEL LYMPH NODE BIOPSY;  Surgeon: Donnice Lima, MD;  Location: [facility];  Service: General;  Laterality: Left;   CARPAL TUNNEL RELEASE Left 08/22/2017   Procedure: LEFT CARPAL TUNNEL RELEASE;  Surgeon: Murrell Kuba, MD;  Location: [facility];  Service: Orthopedics;  Laterality: Left;   CARPOMETACARPEL SUSPENSION PLASTY Left 08/22/2017   Procedure: SUSPENSIONPLASTY LEFT THUMB TRAPENZIUM EXCISION ABDUCTOR POLLICIS LONGUS;  Surgeon: Murrell Kuba, MD;  Location: [facility];  Service: Orthopedics;  Laterality: Left;   COLONOSCOPY W/ POLYPECTOMY     CORONARY ANGIOPLASTY WITH STENT PLACEMENT     GASTRIC BYPASS     HYSTERECTOMY ABDOMINAL WITH SALPINGECTOMY     ULNAR NERVE TRANSPOSITION Left 08/22/2017   Procedure: ULNAR NERVE DECOMPRESSION;  Surgeon: Murrell Kuba, MD;  Location: [facility];  Service: Orthopedics;  Laterality: Left;    Family History  Problem Relation Age of Onset   Atrial fibrillation Mother    Dementia Mother    Hypertension Mother    Gout Mother    Arthritis Mother    Stroke Mother    COPD Father    Emphysema Father    Lung cancer Father    Melanoma Father    Hypertension Sister    Sleep apnea Sister    Hypertension Brother    Hypertension Brother    Heart attack Maternal Aunt    Breast cancer Maternal Aunt        dx over 50   Heart attack Maternal Uncle    Prostate cancer Maternal Uncle        unsure if this was truly prostate cancer   Heart attack Paternal Uncle    Heart attack Maternal Grandfather    Breast cancer Cousin        mat first  cousin   Breast cancer Cousin        mat first cousin   Cancer Cousin        father's maternal first cousin with Breast, Ovarian, Uterine Cancer   Leukemia Cousin        3 of father's maternal first cousin   Leukemia Other        father's maternal uncle   Leukemia Other        father's maternal uncle   Colon cancer Neg Hx    Colon polyps Neg Hx    Esophageal cancer Neg Hx    Pancreatic cancer Neg Hx    Stomach cancer Neg Hx     Social History   Socioeconomic History   Marital status: Widowed    Spouse name: Kuba   Number of children: 3   Years of education: Not on file   Highest education level: Not on file  Occupational History    Comment: Retired  Tobacco Use   Smoking status: Former    Types: Cigarettes    Passive exposure: Past   Smokeless tobacco: Never  Vaping Use   Vaping status: Never Used  Substance and Sexual Activity   Alcohol use: No    Alcohol/week: 0.0 standard drinks of alcohol   Drug use: No   Sexual activity: Never    Birth control/protection: Post-menopausal  Other Topics Concern   Not on file  Social History Narrative   Epworth Sleepiness Scale Score: 10       Current Social History 08/15/2017        Who lives at home: Patient lives alone in one level home 08/15/2017       Transportation: Patient has own vehicle  08/15/2017   Important Relationships: Fiance, 3 sons, 10 grandkids, mother, 2 brothers, 1 sister and their families and many friends 08/15/2017    Pets: None 08/15/2017   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 08/15/2017   L. Ducatte, RN, BSN       Lives with middle son, Selinda. Has a dog Rascal that is sick - might have  to put him down                                                                                                 Social Drivers of Corporate Investment Banker Strain: Low Risk  (03/23/2024)   Overall Financial Resource Strain (CARDIA)    Difficulty of Paying Living Expenses: Not hard at all  Food Insecurity: No Food Insecurity (03/23/2024)   Hunger Vital Sign    Worried About Running Out of Food in the Last Year: Never true    Ran Out of Food in the Last Year: Never true  Transportation Needs: No Transportation Needs (03/23/2024)   PRAPARE - Administrator, Civil Service (Medical): No    Lack of Transportation (Non-Medical): No  Physical Activity: Inactive (03/23/2024)   Exercise Vital Sign    Days of Exercise per Week: 0 days    Minutes of Exercise per Session: 0 min  Stress: No Stress Concern Present (03/23/2024)   [employer] of Occupational Health - Occupational Stress Questionnaire    Feeling of Stress : Not at all  Social Connections: Moderately Integrated (03/23/2024)   Social Connection and Isolation Panel    Frequency of Communication with Friends and Family: Twice a week    Frequency of Social Gatherings with Friends and Family: Once a week    Attends Religious Services: More than 4 times per year    Active Member of [employer] or Organizations: Yes    Attends Banker Meetings: Never    Marital Status: Widowed  Intimate Partner Violence: Not At Risk (03/23/2024)   Humiliation, Afraid, Rape, and Kick questionnaire    Fear of Current or Ex-Partner: No    Emotionally Abused: No    Physically Abused: No    Sexually Abused: No    ROS  Per HPI      Objective    BP (!) 143/82   Pulse 72   Temp 97.8 F (36.6 C) (Oral)   Ht 5' 4.5 (1.638 m)   Wt 183 lb 1.9 oz (83.1 kg)   SpO2 95%   BMI 30.95 kg/m   Physical Exam Constitutional:      General: She is not in acute distress.    Appearance: Normal appearance.  Cardiovascular:     Rate and  Rhythm: Normal rate and regular rhythm.     Heart sounds: Normal heart sounds. No murmur heard.    No friction rub. No gallop.  Pulmonary:     Effort: Pulmonary effort is normal. No respiratory distress.     Breath sounds: Normal breath sounds.  Musculoskeletal:        General: No swelling.     Cervical back: Neck supple.     Lumbar back: Tenderness (Over left buttock) present. No deformity, spasms or bony tenderness.  Lymphadenopathy:  Cervical: No cervical adenopathy.  Skin:    General: Skin is warm and dry.  Neurological:     General: No focal deficit present.     Mental Status: She is alert.  Psychiatric:        Mood and Affect: Mood normal.        Behavior: Behavior normal.        Thought Content: Thought content normal.           Assessment & Plan:   Paroxysmal atrial fibrillation (HCC) Assessment & Plan: Rate controlled on metoprolol  25 mg daily.  Anticoagulated on Xarelto  20 mg daily.  Follows with cardiology.  Stable and regular rate and rhythm today.  Will continue to monitor.   Screen for colon cancer -     Ambulatory referral to Gastroenterology  Encounter for vaccination -     Tdap vaccine greater than or equal to 7yo IM  Class 1 obesity due to excess calories with body mass index (BMI) of 30.0 to 30.9 in adult, unspecified whether serious comorbidity present -     VITAMIN D 25 Hydroxy (Vit-D Deficiency, Fractures) -     TSH -     Hemoglobin A1c -     Lipid panel -     Comprehensive metabolic panel with GFR -     CBC with Differential/Platelet -     Iron , TIBC and Ferritin Panel  Lumbar back pain with radiculopathy affecting right lower extremity Assessment & Plan: Chronic ongoing lumbar back pain, radiculopathic symptoms, lower extremity peripheral neuropathy, without new acute changes. Recommended today to continue gabapentin  100 mg TID. Patient never picked up capcaicin cream prescribed by last PCP. New Rx sent in today.  Previous treatments  include steroid injection and  oxycodone . Given patient's previous diagnosis of mild dementia, would not recommend oxycodone  or other narcotics at this time.  If this proves ineffective for pain, consider referral to pain management.  Provided patient with [facility] phone number to call and schedule her lumbar MRI that was ordered back in September.    Other iron  deficiency anemia Assessment & Plan: Rechecking CBC and iron  panel today.  Continue ferrous sulfate  325 mg supplement daily for now.   Coronary artery disease involving native coronary artery of native heart without angina pectoris Assessment & Plan: Has follow-up scheduled with cardiology on 10/21/2024.  Continue Crestor  20 mg daily.  Rechecking lipid panel with labs.   Essential hypertension Assessment & Plan: BP goal <130/80.  BP above goal in office today and on recheck, however patient reports that she did not take her medications this morning.  Patient at high risk with history of thoracic aortic aneurysm.  Continue losartan  100 mg and metoprolol  25 mg XL daily.  Spironolactone  was discontinued several weeks ago due to episodes of hypotension and dizziness.  Rechecking a CMP with labs today.  Instructed patient to continue ambulatory blood pressure monitoring and if blood pressure is consistently above goal at home, will consider readding spironolactone  versus amlodipine.   Mild dementia without behavioral disturbance, psychotic disturbance, mood disturbance, or anxiety, unspecified dementia type [facility]) Assessment & Plan: Provided patient with an updated medication list including names of medications, dosing, when to take them, and also hand wrote what she is taking each medication for.  Patient is also following with registered pharmacist for polypharmacy management.  Gabapentin  dose has been corrected from 300 mg to 100 mg 3 times daily.  Patient may benefit from use of pillbox or having her medications  mailed to her  and prepackaged pill packs from the [facility].  Patient's 2 sons live with her and help her manage her medications and her appointments.  Will continue to monitor.   Aneurysm of ascending aorta without rupture Assessment & Plan: Patient to see cardiology on 10/21/2024.  Discussed the importance of tight blood pressure control to prevent progression.  Will continue to monitor.   Other orders -     Ziks Arthritis Pain Relief ; Apply 1 Application topically daily.  Dispense: 56.5 g; Refill: 1    Return in about 3 months (around 01/12/2025) for HTN, HLD, back pain.   Saddie JULIANNA Sacks, PA-C

## 2024-10-12 NOTE — Assessment & Plan Note (Signed)
 BP goal <130/80.  BP above goal in office today and on recheck, however patient reports that she did not take her medications this morning.  Patient at high risk with history of thoracic aortic aneurysm.  Continue losartan  100 mg and metoprolol  25 mg XL daily.  Spironolactone  was discontinued several weeks ago due to episodes of hypotension and dizziness.  Rechecking a CMP with labs today.  Instructed patient to continue ambulatory blood pressure monitoring and if blood pressure is consistently above goal at home, will consider readding spironolactone  versus amlodipine.

## 2024-10-12 NOTE — Assessment & Plan Note (Signed)
 Has follow-up scheduled with cardiology on 10/21/2024.  Continue Crestor  20 mg daily.  Rechecking lipid panel with labs.

## 2024-10-12 NOTE — Assessment & Plan Note (Signed)
 Patient to see cardiology on 10/21/2024.  Discussed the importance of tight blood pressure control to prevent progression.  Will continue to monitor.

## 2024-10-12 NOTE — Assessment & Plan Note (Signed)
 Rate controlled on metoprolol  25 mg daily.  Anticoagulated on Xarelto  20 mg daily.  Follows with cardiology.  Stable and regular rate and rhythm today.  Will continue to monitor.

## 2024-10-13 LAB — LIPID PANEL
Chol/HDL Ratio: 1.9 ratio (ref 0.0–4.4)
Cholesterol, Total: 102 mg/dL (ref 100–199)
HDL: 53 mg/dL (ref >39)
LDL Chol Calc (NIH): 33 mg/dL (ref 0–99)
Triglycerides: 80 mg/dL (ref 0–149)
VLDL Cholesterol Cal: 16 mg/dL (ref 5–40)

## 2024-10-13 LAB — HEMOGLOBIN A1C
Est. average glucose Bld gHb Est-mCnc: 114 mg/dL
Hgb A1c MFr Bld: 5.6 % (ref 4.8–5.6)

## 2024-10-13 LAB — COMPREHENSIVE METABOLIC PANEL WITH GFR
ALT: 25 IU/L (ref 0–32)
AST: 34 IU/L (ref 0–40)
Albumin: 4.4 g/dL (ref 3.8–4.8)
Alkaline Phosphatase: 92 IU/L (ref 49–135)
BUN/Creatinine Ratio: 15 (ref 12–28)
BUN: 14 mg/dL (ref 8–27)
Bilirubin Total: 0.6 mg/dL (ref 0.0–1.2)
CO2: 20 mmol/L (ref 20–29)
Calcium: 11 mg/dL — ABNORMAL HIGH (ref 8.7–10.3)
Chloride: 107 mmol/L — ABNORMAL HIGH (ref 96–106)
Creatinine, Ser: 0.91 mg/dL (ref 0.57–1.00)
Globulin, Total: 2.4 g/dL (ref 1.5–4.5)
Glucose: 90 mg/dL (ref 70–99)
Potassium: 4.1 mmol/L (ref 3.5–5.2)
Sodium: 140 mmol/L (ref 134–144)
Total Protein: 6.8 g/dL (ref 6.0–8.5)
eGFR: 67 mL/min/1.73 (ref >59)

## 2024-10-13 LAB — VITAMIN D 25 HYDROXY (VIT D DEFICIENCY, FRACTURES): Vit D, 25-Hydroxy: 34.4 ng/mL (ref 30.0–100.0)

## 2024-10-13 LAB — CBC WITH DIFFERENTIAL/PLATELET
Basophils Absolute: 0 x10E3/uL (ref 0.0–0.2)
Basos: 1 %
EOS (ABSOLUTE): 0.1 x10E3/uL (ref 0.0–0.4)
Eos: 1 %
Hematocrit: 37.9 % (ref 34.0–46.6)
Hemoglobin: 12.1 g/dL (ref 11.1–15.9)
Immature Grans (Abs): 0 x10E3/uL (ref 0.0–0.1)
Immature Granulocytes: 0 %
Lymphocytes Absolute: 1.6 x10E3/uL (ref 0.7–3.1)
Lymphs: 31 %
MCH: 30.6 pg (ref 26.6–33.0)
MCHC: 31.9 g/dL (ref 31.5–35.7)
MCV: 96 fL (ref 79–97)
Monocytes Absolute: 0.5 x10E3/uL (ref 0.1–0.9)
Monocytes: 10 %
Neutrophils Absolute: 3 x10E3/uL (ref 1.4–7.0)
Neutrophils: 57 %
Platelets: 146 x10E3/uL — ABNORMAL LOW (ref 150–450)
RBC: 3.96 x10E6/uL (ref 3.77–5.28)
RDW: 18.3 % — ABNORMAL HIGH (ref 11.7–15.4)
WBC: 5.3 x10E3/uL (ref 3.4–10.8)

## 2024-10-13 LAB — IRON,TIBC AND FERRITIN PANEL
Ferritin: 46 ng/mL (ref 15–150)
Iron Saturation: 29 % (ref 15–55)
Iron: 85 ug/dL (ref 27–139)
Total Iron Binding Capacity: 297 ug/dL (ref 250–450)
UIBC: 212 ug/dL (ref 118–369)

## 2024-10-13 LAB — TSH: TSH: 0.751 u[IU]/mL (ref 0.450–4.500)

## 2024-10-14 ENCOUNTER — Ambulatory Visit: Payer: Self-pay

## 2024-10-15 NOTE — Telephone Encounter (Signed)
 Called and LVM; related the message from provider.

## 2024-10-17 ENCOUNTER — Other Ambulatory Visit: Payer: Self-pay | Admitting: Student

## 2024-10-17 DIAGNOSIS — M25551 Pain in right hip: Secondary | ICD-10-CM

## 2024-10-21 ENCOUNTER — Ambulatory Visit: Admitting: Cardiology

## 2024-10-22 ENCOUNTER — Telehealth: Payer: Self-pay

## 2024-10-22 ENCOUNTER — Ambulatory Visit

## 2024-10-22 VITALS — BP 131/88 | HR 71 | Ht 64.5 in | Wt 180.0 lb

## 2024-10-22 DIAGNOSIS — Z1211 Encounter for screening for malignant neoplasm of colon: Secondary | ICD-10-CM

## 2024-10-22 DIAGNOSIS — I10 Essential (primary) hypertension: Secondary | ICD-10-CM | POA: Diagnosis not present

## 2024-10-22 MED ORDER — AMLODIPINE BESYLATE 2.5 MG PO TABS: 2.5000 mg | ORAL_TABLET | Freq: Every day | ORAL | 2 refills | Status: DC

## 2024-10-22 NOTE — Patient Instructions (Signed)
 VISIT SUMMARY: Today, we discussed your blood pressure management, ongoing fatigue, chronic back pain, and preventive health maintenance. We made adjustments to your hypertension medication and planned for your upcoming colonoscopy.  YOUR PLAN: HYPERTENSION: Your blood pressure readings are still high, especially the diastolic values. -Start taking amlodipine  2.5 mg daily. -Continue with your current medications, metoprolol  and losartan . -Monitor your blood pressure once or twice daily for two weeks and record the readings in the provided log. -Drop off the blood pressure log at the front desk after two weeks. -Follow up in late January to review your blood pressure log and adjust treatment if needed.  ENCOUNTER FOR SCREENING FOR MALIGNANT NEOPLASM OF COLON: You are due for a colonoscopy.  - The order has been sent to [facility] in [place].  -This will be your last colonoscopy if the results are normal. -Use the provided address to schedule your appointment.  If you have any problems before your next visit feel free to message me via MyChart (minor issues or questions) or call the office, otherwise you may reach out to schedule an office visit.  Thank you! Saddie Sacks, PA-C

## 2024-10-22 NOTE — Progress Notes (Signed)
 Acute Office Visit  Subjective:     Patient ID: Christina Hardin, female    DOB: [birth year], 73 y.o.   MRN: [id number]  Chief Complaint  Patient presents with   Blood Pressure Check    HPI  History of Present Illness   Christina Hardin is a 73 year old female with hypertension who presents for blood pressure management.  Hypertension - Blood pressure readings remain elevated, with diastolic values in the 90s. - Today's readings: 128/92 (morning), 139/96 (noon), 112/70 (evening). - Current antihypertensive regimen includes metoprolol  XL and losartan  100 mg, taken late in the morning after waking. - Amlodipine 5 mg was discontinued in 2022 due to low blood pressure readings at that time. - Denies symptoms such as chest pain, vision changes, shortness of breath, swelling, etc.  Preventive health maintenance - Due for colonoscopy; previous procedure performed at [facility]. - Considering scheduling at [facility], where her father recently had a procedure; scheduling may require several months. - Received flu shot and updated tetanus vaccine. - Received first dose of shingles vaccine; hesitant to complete series due to concern for side effects.         ROS Per HPI     Objective:    BP 131/88   Pulse 71   Ht 5' 4.5 (1.638 m)   Wt 180 lb 0.6 oz (81.7 kg)   SpO2 98%   BMI 30.43 kg/m    Physical Exam Constitutional:      General: She is not in acute distress.    Appearance: Normal appearance.  Cardiovascular:     Rate and Rhythm: Normal rate and regular rhythm.     Heart sounds: Normal heart sounds. No murmur heard.    No friction rub. No gallop.  Pulmonary:     Effort: Pulmonary effort is normal. No respiratory distress.     Breath sounds: Normal breath sounds.  Abdominal:     General: Bowel sounds are normal.  Musculoskeletal:        General: No swelling.     Cervical back: Neck supple.  Skin:    General: Skin is warm and dry.  Neurological:      General: No focal deficit present.     Mental Status: She is alert.  Psychiatric:        Mood and Affect: Mood normal.        Behavior: Behavior normal.        Thought Content: Thought content normal.      No results found for any visits on 10/22/24.      Assessment & Plan:   Screen for colon cancer -     Ambulatory referral to Gastroenterology  Essential hypertension Assessment & Plan: BP goal <130/80.  BP has been above goal recently on ambulatory blood pressure monitoring.  Patient at high risk with history of thoracic aortic aneurysm.  Continue losartan  100 mg and metoprolol  25 mg XL daily.  Have prescribed amlodipine 2.5 mg daily for extra blood pressure support which suspect will lower diastolic blood pressure to goal. - Provided blood pressure log for monitoring once or twice daily for two weeks. - Instructed to drop off blood pressure log at front desk after two weeks. - Scheduled follow-up in late January to review log and adjust treatment.   Other orders -     amLODIPine Besylate; Take 1 tablet (2.5 mg total) by mouth daily.  Dispense: 30 tablet; Refill: 2     Return in about  8 weeks (around 12/17/2024) for Med check, HTN.  Saddie JULIANNA Sacks, PA-C

## 2024-10-22 NOTE — Telephone Encounter (Signed)
 LVM to verify that she meant to cancel the appt today with Kara.

## 2024-10-22 NOTE — Assessment & Plan Note (Addendum)
 BP goal <130/80.  BP has been above goal recently on ambulatory blood pressure monitoring.  Patient at high risk with history of thoracic aortic aneurysm.  Continue losartan  100 mg and metoprolol  25 mg XL daily.  Have prescribed amlodipine  2.5 mg daily for extra blood pressure support which suspect will lower diastolic blood pressure to goal. - Provided blood pressure log for monitoring once or twice daily for two weeks. - Instructed to drop off blood pressure log at front desk after two weeks. - Scheduled follow-up in late January to review log and adjust treatment.

## 2024-11-02 ENCOUNTER — Ambulatory Visit
Admission: RE | Admit: 2024-11-02 | Discharge: 2024-11-02 | Disposition: A | Source: Ambulatory Visit | Attending: Family Medicine

## 2024-11-02 DIAGNOSIS — G609 Hereditary and idiopathic neuropathy, unspecified: Secondary | ICD-10-CM

## 2024-11-03 ENCOUNTER — Ambulatory Visit: Payer: Self-pay

## 2024-11-03 ENCOUNTER — Other Ambulatory Visit: Payer: Self-pay

## 2024-11-03 DIAGNOSIS — M5416 Radiculopathy, lumbar region: Secondary | ICD-10-CM

## 2024-11-03 MED ORDER — TRAMADOL HCL 50 MG PO TABS: 50.0000 mg | ORAL_TABLET | Freq: Three times a day (TID) | ORAL | 0 refills | Status: AC | PRN

## 2024-11-03 NOTE — Telephone Encounter (Signed)
 FYI Only or Action Required?: Action required by provider: update on patient condition and requesting pain medication for ongoing back pain.  Patient was last seen in primary care on 10/22/2024 by Christina Saddie FALCON, PA-C.  Called Nurse Triage reporting Back Pain.  Symptoms began several months ago.  Interventions attempted: OTC medications: tylenol , Nerve Bliss, Prescription medications: gabapentin , and Rest, hydration, or home remedies.  Symptoms are: gradually worsening.  Triage Disposition: See HCP Within 4 Hours (Or PCP Triage)  Patient/caregiver understands and will follow disposition?: No, wishes to speak with PCP  Copied from CRM #[id number]. Topic: Clinical - Red Word Triage >> Nov 03, 2024  3:58 PM Amy B wrote: Red Word that prompted transfer to Nurse Triage: Severe left sided back pain radiating to legs.  Numbness in toes Reason for Disposition  [1] SEVERE back pain (e.g., excruciating, unable to do any normal activities) AND [2] not improved 2 hours after pain medicine  Answer Assessment - Initial Assessment Questions Had MRI L spine yesterday for ongoing back pain. Intermittent Numbness off and on from knee down to toes/feet on left side. Present intermittently since MRI, laying in the hard machine. Had to take son to court today and had to sit in the hard pews for hours and is much worse.  Taking tylenol , Nerve Bliss, Gabapentin  for pain. No real improvement. Does not think she can make it in for an appointment and asks that something be called in since she was seen for this 2 weeks ago. Advised we can ask. Pharmacy confirmed. Understands for any worsening, weakness- to go into ED.  Please advise on pain medicine and MRI L-Spine if it can be read   1. ONSET: When did the pain begin? (e.g., minutes, hours, days)     Ongoing pain but worsened with MRI on hard surface 2. LOCATION: Where does it hurt? (upper, mid or lower back)     Left lower back above the left glute 3. SEVERITY:  How bad is the pain?  (e.g., Scale 1-10; mild, moderate, or severe)     9-10/10 4. PATTERN: Is the pain constant? (e.g., yes, no; constant, intermittent)      Some days are better than others  5. RADIATION: Does the pain shoot into your legs or somewhere else?     Down the left leg  6. CAUSE:  What do you think is causing the back pain?      Ongoing back issues 7. BACK OVERUSE:  Any recent lifting of heavy objects, strenuous work or exercise?     Lumbar MRI yesterday, sat in court today on hard benches 8. MEDICINES: What have you taken so far for the pain? (e.g., nothing, acetaminophen , NSAIDS)     Tylenol , Nerve Bliss (OTC) and Blood support 9. NEUROLOGIC SYMPTOMS: Do you have any weakness, numbness, or problems with bowel/bladder control?     Numbness intermittent 10. OTHER SYMPTOMS: Do you have any other symptoms? (e.g., fever, abdomen pain, burning with urination, blood in urine)       Intermittent numbness from knee to toes  Protocols used: Back Pain-A-AH

## 2024-11-03 NOTE — Telephone Encounter (Signed)
 Lvm informing pt of below.   Christina Hardin, [place] to [facility]  (Selected Message)    11/03/24  5:09 PM Note I agree with triage advice. I have sent in Tramadol  for her pain to [facility].  I will follow up with her when the results of her MRI are back

## 2024-11-03 NOTE — Telephone Encounter (Signed)
 I agree with triage advice. I have sent in Tramadol  for her pain to [facility].  I will follow up with her when the results of her MRI are back

## 2024-11-15 ENCOUNTER — Ambulatory Visit: Payer: Self-pay

## 2024-11-15 DIAGNOSIS — M4316 Spondylolisthesis, lumbar region: Secondary | ICD-10-CM

## 2024-11-15 DIAGNOSIS — M48061 Spinal stenosis, lumbar region without neurogenic claudication: Secondary | ICD-10-CM

## 2024-11-16 ENCOUNTER — Telehealth: Payer: Self-pay

## 2024-11-16 NOTE — Telephone Encounter (Signed)
 Copied from CRM #[id number]. Topic: Clinical - Lab/Test Results >> Nov 16, 2024 10:26 AM Antony RAMAN wrote: Reason for CRM: calling for MRI results,  [id number]

## 2024-11-16 NOTE — Telephone Encounter (Signed)
 MRI results and next steps were sent via MyChart yesterday

## 2024-11-17 ENCOUNTER — Ambulatory Visit: Admitting: Cardiology

## 2024-12-08 ENCOUNTER — Ambulatory Visit: Admitting: Orthopedic Surgery

## 2024-12-16 ENCOUNTER — Ambulatory Visit: Attending: Cardiology | Admitting: Cardiology

## 2024-12-16 ENCOUNTER — Encounter: Payer: Self-pay | Admitting: Cardiology

## 2024-12-16 VITALS — BP 100/75 | HR 80 | Resp 16 | Ht 64.0 in | Wt 172.6 lb

## 2024-12-16 DIAGNOSIS — I10 Essential (primary) hypertension: Secondary | ICD-10-CM

## 2024-12-16 DIAGNOSIS — I959 Hypotension, unspecified: Secondary | ICD-10-CM

## 2024-12-16 DIAGNOSIS — K921 Melena: Secondary | ICD-10-CM

## 2024-12-16 DIAGNOSIS — I2510 Atherosclerotic heart disease of native coronary artery without angina pectoris: Secondary | ICD-10-CM

## 2024-12-16 DIAGNOSIS — I4892 Unspecified atrial flutter: Secondary | ICD-10-CM

## 2024-12-16 DIAGNOSIS — Z853 Personal history of malignant neoplasm of breast: Secondary | ICD-10-CM | POA: Diagnosis not present

## 2024-12-16 DIAGNOSIS — I7121 Aneurysm of the ascending aorta, without rupture: Secondary | ICD-10-CM

## 2024-12-16 MED ORDER — LOSARTAN POTASSIUM 50 MG PO TABS: 50.0000 mg | ORAL_TABLET | Freq: Every day | ORAL | 3 refills | Status: DC

## 2024-12-16 NOTE — Patient Instructions (Addendum)
 Medication Instructions:  STOP Amlodipine  (Norvasc )  STOP Spironolactone  (Aldactone )  DECREASE Losartan  (Cozaar ) to 50 mg. Take one (1) tablet by mouth once daily in the morning. HOLD if systolic Blood Pressure (top number) is less than 120 *If you need a refill on your cardiac medications before your next appointment, please call your pharmacy*  Lab Work: Hemoglobin and Hematocrit, BMP today If you have labs (blood work) drawn today and your tests are completely normal, you will receive your results only by: MyChart Message (if you have MyChart) OR A paper copy in the mail If you have any lab test that is abnormal or we need to change your treatment, we will call you to review the results.  Testing/Procedures: Chest CT without Contrast in May 2026  Follow-Up: At [facility], you and your health needs are our priority.  As part of our continuing mission to provide you with exceptional heart care, our providers are all part of one team.  This team includes your primary Cardiologist (physician) and Advanced Practice Providers or APPs (Physician Assistants and Nurse Practitioners) who all work together to provide you with the care you need, when you need it.  Your next appointment:   5 month(s)  Provider:   Madonna Large, DO    We recommend signing up for the patient portal called MyChart.  Sign up information is provided on this After Visit Summary.  MyChart is used to connect with patients for Virtual Visits (Telemedicine).  Patients are able to view lab/test results, encounter notes, upcoming appointments, etc.  Non-urgent messages can be sent to your provider as well.   To learn more about what you can do with MyChart, go to [email or web address].   Other Instructions  Your provider is ordering a Chest CT without contrast to evaluate you for aortic aneurysm.  Please follow up with your PCP for evaluation and monitoring of your Blood Pressure and Bleeding

## 2024-12-16 NOTE — Progress Notes (Signed)
 "   Cardiology Office Note:    Date:  12/16/2024  NAME:  Christina Hardin    MRN: [id number] DOB:  11-Mar-1951   PCP:  Christina Hardin  Former Cardiology Providers: Dr. Mona Primary Cardiologist:  Madonna Large, DO, [facility] (established care 12/16/2024) Electrophysiologist:  None   Referring MD: Rosalynn Camie CROME, MD  Reason of Consult: Coronary artery disease/reestablish care  Chief Complaint  Patient presents with    Coronary artery disease involving native coronary artery o   New Patient (Initial Visit)    History of Present Illness:    Christina Hardin is a 74 y.o. 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   She is being seen today for the evaluation of coronary disease at the request of Rosalynn Camie CROME, MD.  Patient was formally under the care of Dr. Mona and then she moved to [place]  after getting married.  After the loss of her husband she moved back to [facility] and is referred to cardiology to reestablish care.  She denies anginal chest pain or heart failure symptoms. She complains of feeling tired, fatigued, noticing low blood pressures.  She did not have a blood pressure log available for review.  She is on anticoagulation for thromboembolic prophylaxis given her history of atrial flutter.  She is also on iron  supplementation.  On further questioning patient endorses having black stools for the past 1 month.  Despite noticing that her blood pressures are running soft she continues to be on amlodipine  2.5 mg p.o. daily, losartan  100 mg p.o. daily, and Toprol -XL 25 mg p.o. every morning.  She was on spironolactone  25 mg p.o. daily and states that she has held off for reasons unknown.  She has a history of thoracic aortic aneurysm (more suggestive of dilatation) she used to follow  with Dr. Sherrine until moving to [place] .  She denies any pain between her shoulder blades, no recent prolonged immobilization, no near-syncope or syncopal events.  During her time in [place]  patient was hospitalized in 2022 and underwent PCI to the RCA records available in Care Everywhere.  Current Medications: Active Medications[1]   Allergies:    Dilaudid  [hydromorphone  hcl], Heme-iron -polypeptide [iron ], and Propoxyphene hcl   Past Medical History: 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   Diastolic dysfunction without heart failure 07/06/2016   Diverticulosis of colon 01/30/2007   Qualifier: Diagnosis of   By: Jil MD, Josette GORMAN     Replacing diagnoses that were inactivated after the 03/04/23 regulatory import     Ductal carcinoma in situ (DCIS) of left breast    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     History of breast cancer 11/16/2016   History of  colon polyps    sessile polyp 2013   NEPHROLITHIASIS 09/17/2008   Qualifier: Diagnosis of   By: Scarlet MD, Elsie         Personal history of chemotherapy 2017   Recurrent UTI 03/10/2024   Reflux esophagitis 01/30/2007   Qualifier: Diagnosis of   By: Jil MD, Josette RAMAN        Renal cyst, right 09/17/2008   Qualifier: Diagnosis of   By: Scarlet MD, Elsie         Rosacea 01/30/2007   Qualifier: Diagnosis of   By: Jil MD, Josette RAMAN        S/P drug eluting coronary stent placement  03/13/2024   [place] [facility] 10/05/2021: Mild to moderate (50-60% diameter stenosis), single discreet stenosis of distal RCA. Minimal luminal narrowing of the LAD arteryCardiac catheterization 10/30/2021: DES to RCA, EF 60%, LVEDP 10 mmHg     Thoracic aortic aneurysm    Thoracic aortic aneurysm     Past Surgical History: Past Surgical History:  Procedure Laterality Date   ABDOMINAL HYSTERECTOMY     APPENDECTOMY  1958   BLADDER REPAIR     BREAST BIOPSY Left    BREAST LUMPECTOMY Left 10/2016   BREAST LUMPECTOMY WITH RADIOACTIVE SEED AND SENTINEL LYMPH NODE BIOPSY Left 10/09/2016   Procedure: BREAST LUMPECTOMY WITH RADIOACTIVE SEED AND SENTINEL LYMPH NODE BIOPSY;  Surgeon: Donnice Lima, MD;  Location: [facility];  Service: General;  Laterality: Left;   CARPAL TUNNEL RELEASE Left 08/22/2017   Procedure: LEFT CARPAL TUNNEL RELEASE;  Surgeon: Murrell Kuba, MD;  Location: [facility];  Service: Orthopedics;  Laterality: Left;   CARPOMETACARPEL SUSPENSION PLASTY Left 08/22/2017   Procedure: SUSPENSIONPLASTY LEFT THUMB TRAPENZIUM EXCISION ABDUCTOR POLLICIS LONGUS;  Surgeon: Murrell Kuba, MD;  Location: [facility];  Service: Orthopedics;  Laterality: Left;   COLONOSCOPY W/ POLYPECTOMY     CORONARY ANGIOPLASTY WITH STENT PLACEMENT     GASTRIC BYPASS     HYSTERECTOMY ABDOMINAL WITH SALPINGECTOMY     ULNAR NERVE TRANSPOSITION Left 08/22/2017   Procedure: ULNAR NERVE DECOMPRESSION;  Surgeon: Murrell Kuba, MD;  Location: [facility];  Service: Orthopedics;  Laterality: Left;    Social History: Social History[2]  Family History: Family History  Problem Relation Age of Onset   Atrial fibrillation Mother    Dementia Mother    Hypertension Mother    Gout Mother    Arthritis Mother    Stroke Mother    COPD Father    Emphysema Father    Lung cancer Father    Melanoma Father    Hypertension Sister    Sleep apnea Sister    Hypertension  Brother    Hypertension Brother    Heart attack Maternal Aunt    Breast cancer Maternal Aunt        dx over 50   Heart attack Maternal Uncle    Prostate cancer Maternal Uncle        unsure if this was truly prostate cancer   Heart attack Paternal Uncle    Heart attack Maternal Grandfather    Breast cancer Cousin        mat first cousin   Breast cancer Cousin        mat first cousin   Cancer Cousin        father's maternal first cousin with Breast, Ovarian, Uterine Cancer   Leukemia Cousin        3 of father's maternal first  cousin   Leukemia Other        father's maternal uncle   Leukemia Other        father's maternal uncle   Colon cancer Neg Hx    Colon polyps Neg Hx    Esophageal cancer Neg Hx    Pancreatic cancer Neg Hx    Stomach cancer Neg Hx     ROS:   Review of Systems  Constitutional: Positive for malaise/fatigue.  Cardiovascular:  Negative for chest pain, claudication, irregular heartbeat, leg swelling, near-syncope, orthopnea, palpitations, paroxysmal nocturnal dyspnea and syncope.  Respiratory:  Negative for shortness of breath.   Hematologic/Lymphatic: Negative for bleeding problem.  Gastrointestinal:  Positive for melena.    Studies Reviewed:   EKG: EKG Interpretation Date/Time:  Wednesday December 16 2024 10:33:09 EST Ventricular Rate:  72 PR Interval:  168 QRS Duration:  76 QT Interval:  374 QTC Calculation: 409 R Axis:   7  Text Interpretation: Normal sinus rhythm Indeterminate axis Inferior infarct , age undetermined Possible Anterolateral infarct , age undetermined When compared with ECG of 01-May-2023 07:11, Low voltage QRS , new Since last tracing Confirmed by Michele Richardson [phone]) on 12/16/2024 11:16:06 AM  Echocardiogram: May 2025: LVEF 55 to 60%. Grade 1 diastolic dysfunction. Right ventricular size and function normal. Mild dilatation of the ascending aorta, 41 mm. See report for additional details  Stress Testing:  Nuclear stress  test October 2017: Low risk study   Heart Catheterization: November 2022, Care Everywhere, [facility])  1. LV: EF 60% - LVEDP 10 mm Hg  2. LM: 0%  3. LAD: 10% proximal  4. LCX: 10% proximal  5. RCA: large dominant artery - distal mid 90%  INTERVENTION:  6 French JR4 SH ostium RCA - Choice PT guide wire iin distal PLA - 3.5x12  mm Synergy XD DES deployed distal-mid RCA at 18 atm - significant residual  mid stent 90% stenosis - 3.5x10 mm Minto Quantum mid stent at 18 atm with  excellent expansion    PCI RESULTS:  Distal-mid RCA: PRE-90% - TIMI 3 - 3.5x12 mm Synergy DES - POST-0% - TIMI  3 - LL: 8 mm   RADIOLOGY: CT ANGIOGRAPHY CHEST WITH CONTRAST 09/18/2023 Grossly stable 4.2 cm ascending thoracic aortic aneurysm. Recommend annual imaging followup by CTA or MRA. This recommendation follows 2010 ACCF/AHA/AATS/ACR/ASA/SCA/SCAI/SIR/STS/SVM Guidelines for the Diagnosis and Management of Patients with Thoracic Aortic Disease. Circulation. 2010; 121: Z733-z630. Aortic aneurysm NOS (ICD10-I71.9).  Labs:    Latest Ref Rng & Units 10/12/2024   11:39 AM 01/10/2024   12:06 PM 05/01/2023    8:37 AM  CBC  WBC 3.4 - 10.8 x10E3/uL 5.3  6.3  5.5   Hemoglobin 11.1 - 15.9 g/dL 87.8  88.8  88.4   Hematocrit 34.0 - 46.6 % 37.9  35.7  36.5   Platelets 150 - 450 x10E3/uL 146  246  256        Latest Ref Rng & Units 10/12/2024   11:39 AM 09/01/2024    3:03 PM 01/10/2024   12:06 PM  BMP  Glucose 70 - 99 mg/dL 90  76  83   BUN 8 - 27 mg/dL 14  11  20    Creatinine 0.57 - 1.00 mg/dL 9.08  8.92  8.99   BUN/Creat Ratio 12 - 28 15  10  20    Sodium 134 - 144 mmol/L 140  142  139   Potassium 3.5 - 5.2 mmol/L 4.1  3.9  4.6   Chloride 96 - 106 mmol/L 107  108  105   CO2 20 - 29 mmol/L 20  21  20    Calcium  8.7 - 10.3 mg/dL 88.9  89.0  88.7       Latest Ref Rng & Units 10/12/2024   11:39 AM 09/01/2024    3:03 PM 01/10/2024   12:06 PM  CMP  Glucose 70 - 99 mg/dL 90   76  83   BUN 8 - 27 mg/dL 14  11  20    Creatinine 0.57 - 1.00 mg/dL 9.08  8.92  8.99   Sodium 134 - 144 mmol/L 140  142  139   Potassium 3.5 - 5.2 mmol/L 4.1  3.9  4.6   Chloride 96 - 106 mmol/L 107  108  105   CO2 20 - 29 mmol/L 20  21  20    Calcium  8.7 - 10.3 mg/dL 88.9  89.0  88.7   Total Protein 6.0 - 8.5 g/dL 6.8     Total Bilirubin 0.0 - 1.2 mg/dL 0.6     Alkaline Phos 49 - 135 IU/L 92     AST 0 - 40 IU/L 34     ALT 0 - 32 IU/L 25       Lab Results  Component Value Date   CHOL 102 10/12/2024   HDL 53 10/12/2024   LDLCALC 33 10/12/2024   TRIG 80 10/12/2024   CHOLHDL 1.9 10/12/2024   No results for input(s): LIPOA in the last 8760 hours. No components found for: NTPROBNP No results for input(s): PROBNP in the last 8760 hours. Recent Labs    01/10/24 1206 10/12/24 1139  TSH 0.698 0.751    Physical Exam:    Today's Vitals   12/16/24 1035 12/16/24 1144  BP: 95/71 100/75  Pulse: 77 80  Resp: 16   SpO2: 97%   Weight: 172 lb 9.6 oz (78.3 kg)   Height: 5' 4 (1.626 m)    Body mass index is 29.63 kg/m. Wt Readings from Last 3 Encounters:  12/16/24 172 lb 9.6 oz (78.3 kg)  10/22/24 180 lb 0.6 oz (81.7 kg)  10/12/24 183 lb 1.9 oz (83.1 kg)    Physical Exam  Constitutional: No distress.  hemodynamically stable  Neck: No JVD present.  Cardiovascular: Normal rate, regular rhythm, S1 normal and S2 normal. Exam reveals no gallop, no S3 and no S4.  No murmur heard. Pulmonary/Chest: Effort normal and breath sounds normal. No stridor. She has no wheezes. She has no rales.  Musculoskeletal:        General: No edema.     Cervical back: Neck supple.  Skin: Skin is warm.   Impression & Recommendation(s):  Impression:   ICD-10-CM   1. Coronary artery disease involving native coronary artery of native heart without angina pectoris  I25.10 EKG 12-Lead    Hemoglobin and hematocrit, blood    Basic Metabolic Panel (BMET)    2. Paroxysmal atrial flutter (HCC)   I48.92 Hemoglobin and hematocrit, blood    Basic Metabolic Panel (BMET)    3. Benign hypertension  I10     4. Aneurysm of ascending aorta without rupture  I71.21 Hemoglobin and hematocrit, blood    Basic Metabolic Panel (BMET)    CT Chest Wo Contrast    5. History of breast cancer  Z85.3     6. Hypotension, unspecified hypotension type  I95.9     7. Melanotic stools  K92.1  Recommendation(s):  Coronary artery disease involving native coronary artery of native heart without angina pectoris Underwent PCI to the RCA back in 2022 outside records from Care Everywhere reviewed as part of today's office visit as she is transitioning her care Denies anginal chest pain or heart failure symptoms EKG is sinus rhythm with low voltage in the precordial leads which is new compared to prior tracing Not on antiplatelets that she is on anticoagulation for thromboembolic prophylaxis Continue Toprol -XL 25 mg p.o. daily Continue Crestor  20 mg p.o. nightly Reemphasized the importance of secondary prevention with focus on improving the modifiable cardiovascular risk factors such as glycemic control, lipid management  Paroxysmal atrial flutter (HCC) Rate control: Metoprolol  succinate. Rhythm control: N/A. Thromboembolic prophylaxis: Xarelto  Patient endorses melanotic stools?  Anticoagulation versus being on iron  supplementation.  Ongoing for the last 1 month. Given her soft blood pressures unsure if this is secondary to GI bleed or over medication with antihypertensive meds Will check H&H and BMP I have asked her to discuss this further with PCP Blood pressure medication changes as none noted below  Benign hypertension Initial and follow-up blood pressures are soft. Denies near-syncope or syncopal event. But does endorse feeling tired and fatigue. Anemia workup as mentioned above. Discontinue amlodipine  2.5 mg p.o. daily, discontinue spironolactone  25 mg p.o. daily-both for soft blood  pressures may be introduced later Decrease losartan  from 100 mg p.o. daily to 50 mg p.o. every morning hold if SBP <120 mmHg  Aneurysm of ascending aorta without rupture Patient had an echocardiogram in May 2025 ascending aorta 41 mm Will repeat CT chest without contrast in May 2026 as a 1 year follow-up study Reemphasized importance of blood pressure management. Symptoms of aortic syndromes discussed and if concerning symptoms arise patient is advised to go to the ED for further evaluation. It is  best to avoid activities that cause grunting or straining (medically referred to as a valsalva maneuver). This happens when a person bears down against a closed throat to increase the strength of arm or abdominal muscles. Theres often a tendency to do this when lifting heavy weights, doing sit-ups, push-ups or chin-ups, etc., but it may be harmful.   Hypotension: Melanotic stools See above with regards to ordering labs and decreasing her blood pressure medications  Orders Placed:  Orders Placed This Encounter  Procedures   CT Chest Wo Contrast    Standing Status:   Future    Expected Date:   12/23/2024    Expiration Date:   12/16/2025    [facility]   Hemoglobin and hematocrit, blood   Basic Metabolic Panel (BMET)   EKG 12-Lead     Final Medication List:    Meds ordered this encounter  Medications   DISCONTD: losartan  (COZAAR ) 50 MG tablet    Sig: Take 1 tablet (50 mg total) by mouth daily.    Dispense:  90 tablet    Refill:  3   losartan  (COZAAR ) 50 MG tablet    Sig: Take 1 tablet (50 mg total) by mouth daily. Hold if Systolic Blood Pressure (top number) is less than 120.    Dispense:  90 tablet    Refill:  3    Medications Discontinued During This Encounter  Medication Reason   losartan  (COZAAR ) 100 MG tablet Dose change   losartan  (COZAAR ) 50 MG tablet    amLODipine  (NORVASC ) 2.5 MG tablet Discontinued by provider    spironolactone  (ALDACTONE ) 25 MG tablet Discontinued  by provider    Current Medications[3]  Consent:   NA  Disposition:   June 2026 after his CT scan  Signed, Madonna Large, DO, [facility]  [facility] Division of [facility] [address] [place], [place] [address]      [1]  Current Meds  Medication Sig   Capsaicin-Menthol-Methyl Sal (CAPSAICIN-METHYL SAL-MENTHOL) 0.025-1-12 % CREA Apply 1 Application topically daily.   Cyanocobalamin 2500 MCG CHEW Chew 2,500 mcg by mouth daily. Vitamin B12   ferrous sulfate  325 (65 FE) MG tablet Take 325 mg by mouth daily.   gabapentin  (NEURONTIN ) 300 MG capsule TAKE 2 CAPSULES BY MOUTH 3 TIMES A DAY   metoprolol  succinate (TOPROL -XL) 25 MG 24 hr tablet TAKE 1 TABLET (25 MG TOTAL) BY MOUTH DAILY.   rivaroxaban  (XARELTO ) 20 MG TABS tablet Take 1 tablet (20 mg total) by mouth daily with supper.   rosuvastatin  (CRESTOR ) 20 MG tablet Take 1 tablet (20 mg total) by mouth daily.   [DISCONTINUED] amLODipine  (NORVASC ) 2.5 MG tablet Take 1 tablet (2.5 mg total) by mouth daily.   [DISCONTINUED] losartan  (COZAAR ) 100 MG tablet TAKE 1 TABLET BY MOUTH EVERY DAY   [DISCONTINUED] losartan  (COZAAR ) 50 MG tablet Take 1 tablet (50 mg total) by mouth daily.  [2]  Social History Tobacco Use   Smoking status: Former    Types: Cigarettes    Passive exposure: Past   Smokeless tobacco: Never  Vaping Use   Vaping status: Never Used  Substance Use Topics   Alcohol use: No    Alcohol/week: 0.0 standard drinks of alcohol   Drug use: No  [3]  Current Outpatient Medications:    Capsaicin-Menthol-Methyl Sal (CAPSAICIN-METHYL SAL-MENTHOL) 0.025-1-12 % CREA, Apply 1 Application topically daily., Disp: 56.5 g, Rfl: 1   Cyanocobalamin 2500 MCG CHEW, Chew 2,500 mcg by mouth daily. Vitamin B12, Disp: , Rfl:    ferrous sulfate  325 (65 FE) MG tablet, Take 325 mg by mouth daily., Disp: , Rfl:    gabapentin  (NEURONTIN ) 300 MG capsule, TAKE 2 CAPSULES  BY MOUTH 3 TIMES A DAY, Disp: 360 capsule, Rfl: 3   metoprolol  succinate (TOPROL -XL) 25 MG 24 hr tablet, TAKE 1 TABLET (25 MG TOTAL) BY MOUTH DAILY., Disp: 90 tablet, Rfl: 3   rivaroxaban  (XARELTO ) 20 MG TABS tablet, Take 1 tablet (20 mg total) by mouth daily with supper., Disp: 90 tablet, Rfl: 0   rosuvastatin  (CRESTOR ) 20 MG tablet, Take 1 tablet (20 mg total) by mouth daily., Disp: 90 tablet, Rfl: 3   gabapentin  (NEURONTIN ) 100 MG capsule, Take 1 capsule (100 mg total) by mouth 3 (three) times daily. (Patient not taking: Reported on 12/16/2024), Disp: 30 capsule, Rfl: 1   losartan  (COZAAR ) 50 MG tablet, Take 1 tablet (50 mg total) by mouth daily. Hold if Systolic Blood Pressure (top number) is less than 120., Disp: 90 tablet, Rfl: 3  "

## 2024-12-17 ENCOUNTER — Ambulatory Visit

## 2024-12-17 ENCOUNTER — Ambulatory Visit: Payer: Self-pay | Admitting: Cardiology

## 2024-12-17 ENCOUNTER — Other Ambulatory Visit (HOSPITAL_COMMUNITY): Payer: Self-pay

## 2024-12-17 LAB — BASIC METABOLIC PANEL WITH GFR
BUN/Creatinine Ratio: 13 (ref 12–28)
BUN: 15 mg/dL (ref 8–27)
CO2: 19 mmol/L — ABNORMAL LOW (ref 20–29)
Calcium: 12.1 mg/dL — ABNORMAL HIGH (ref 8.7–10.3)
Chloride: 107 mmol/L — ABNORMAL HIGH (ref 96–106)
Creatinine, Ser: 1.18 mg/dL — ABNORMAL HIGH (ref 0.57–1.00)
Glucose: 85 mg/dL (ref 70–99)
Potassium: 3.9 mmol/L (ref 3.5–5.2)
Sodium: 142 mmol/L (ref 134–144)
eGFR: 49 mL/min/1.73 — ABNORMAL LOW

## 2024-12-17 LAB — HEMOGLOBIN AND HEMATOCRIT, BLOOD
Hematocrit: 41.6 % (ref 34.0–46.6)
Hemoglobin: 13.5 g/dL (ref 11.1–15.9)

## 2024-12-21 ENCOUNTER — Ambulatory Visit (INDEPENDENT_AMBULATORY_CARE_PROVIDER_SITE_OTHER)

## 2024-12-21 VITALS — BP 110/77 | HR 70 | Temp 97.7°F | Ht 64.0 in | Wt 171.0 lb

## 2024-12-21 DIAGNOSIS — R350 Frequency of micturition: Secondary | ICD-10-CM | POA: Diagnosis not present

## 2024-12-21 DIAGNOSIS — N3000 Acute cystitis without hematuria: Secondary | ICD-10-CM

## 2024-12-21 DIAGNOSIS — N390 Urinary tract infection, site not specified: Secondary | ICD-10-CM | POA: Insufficient documentation

## 2024-12-21 DIAGNOSIS — K5900 Constipation, unspecified: Secondary | ICD-10-CM

## 2024-12-21 DIAGNOSIS — F03A0 Unspecified dementia, mild, without behavioral disturbance, psychotic disturbance, mood disturbance, and anxiety: Secondary | ICD-10-CM | POA: Diagnosis not present

## 2024-12-21 DIAGNOSIS — Z1211 Encounter for screening for malignant neoplasm of colon: Secondary | ICD-10-CM

## 2024-12-21 LAB — POCT URINALYSIS DIP (CLINITEK)
Bilirubin, UA: NEGATIVE
Glucose, UA: NEGATIVE mg/dL
Ketones, POC UA: NEGATIVE mg/dL
Nitrite, UA: POSITIVE — AB
POC PROTEIN,UA: 30 — AB
Spec Grav, UA: 1.025
Urobilinogen, UA: 1 U/dL
pH, UA: 6

## 2024-12-21 MED ORDER — PHENAZOPYRIDINE HCL 100 MG PO TABS: 100.0000 mg | ORAL_TABLET | Freq: Three times a day (TID) | ORAL | 0 refills | Status: DC | PRN

## 2024-12-21 MED ORDER — SULFAMETHOXAZOLE-TRIMETHOPRIM 800-160 MG PO TABS: 1.0000 | ORAL_TABLET | Freq: Two times a day (BID) | ORAL | 0 refills | Status: AC

## 2024-12-21 NOTE — Assessment & Plan Note (Signed)
 Have ensured patient has updated list of her medications and is familiar with what she is taking / taking it for. Her 2 sons live with her and help her around the house and with ADLs when necessary.

## 2024-12-21 NOTE — Progress Notes (Signed)
 "  Acute Office Visit  Subjective:     Patient ID: Christina Hardin, female    DOB: Jan 03, 1951, 74 y.o.   MRN: [id number]  Chief Complaint  Patient presents with   Dysuria    HPI  Discussed the use of AI scribe software for clinical note transcription with the patient, who gave verbal consent to proceed.  History of Present Illness   Christina Hardin is a 74 year old female who presents with urinary tract infection symptoms and concerns about prolonged absence of bowel movements.  Dysuria and urinary findings - Mild discomfort during urination consistent with urinary tract infection symptoms, symptoms started yesterday.  - Denies hematuria, fever, bladder pain, or flank pain - Urinalysis reveals microscopic hematuria, pyuria, and nitrites - Does not have a hx of frequent UTIs   Prolonged absence of bowel movements - Patient has reportedly not had a bowel movement for 2-3 weeks,  - No sensation of constipation or abdominal pain - Single episode of runny bowel movement after taking a laxative, but no solid stool - No hematochezia - Colonoscopy UTD  - History of appendectomy when she was 74 years old       ROS Per HPI     Objective:    BP 110/77   Pulse 70   Temp 97.7 F (36.5 C) (Oral)   Ht 5' 4 (1.626 m)   Wt 171 lb 0.6 oz (77.6 kg)   SpO2 97%   BMI 29.36 kg/m    Physical Exam Constitutional:      General: She is not in acute distress.    Appearance: Normal appearance.  Cardiovascular:     Rate and Rhythm: Normal rate and regular rhythm.     Heart sounds: Normal heart sounds. No murmur heard.    No friction rub. No gallop.  Pulmonary:     Effort: Pulmonary effort is normal. No respiratory distress.     Breath sounds: Normal breath sounds.  Abdominal:     General: Abdomen is flat. Bowel sounds are normal. There is no distension.     Palpations: Abdomen is soft.     Tenderness: There is no abdominal tenderness. There is no right CVA tenderness or left CVA  tenderness.  Musculoskeletal:        General: No swelling.     Cervical back: Neck supple.  Lymphadenopathy:     Cervical: No cervical adenopathy.  Skin:    General: Skin is warm and dry.  Neurological:     General: No focal deficit present.     Mental Status: She is alert.  Psychiatric:        Mood and Affect: Mood normal.        Behavior: Behavior normal.        Thought Content: Thought content normal.      Results for orders placed or performed in visit on 12/21/24  POCT URINALYSIS DIP (CLINITEK)  Result Value Ref Range   Color, UA yellow yellow   Clarity, UA cloudy (A) clear   Glucose, UA negative negative mg/dL   Bilirubin, UA negative negative   Ketones, POC UA negative negative mg/dL   Spec Grav, UA 8.974 8.989 - 1.025   Blood, UA small (A) negative   pH, UA 6.0 5.0 - 8.0   POC PROTEIN,UA =30 (A) negative, trace   Urobilinogen, UA 1.0 0.2 or 1.0 E.U./dL   Nitrite, UA Positive (A) Negative   Leukocytes, UA Moderate (2+) (A) Negative  Assessment & Plan:  Acute cystitis without hematuria Assessment & Plan: Confirmed bacterial infection by urinalysis. Early detection allows prompt treatment. - Prescribed Bactrim  twice daily for 5 days with food. - Sent urine for culture to confirm bacterial sensitivity. - Prescribed Pyridium  for bladder pain relief , noted urine discoloration. - Advised to complete antibiotics even if symptoms improve. - Instructed to contact if symptoms persist post-antibiotics.  Orders: -     Sulfamethoxazole -Trimethoprim ; Take 1 tablet by mouth 2 (two) times daily for 5 days.  Dispense: 10 tablet; Refill: 0 -     Phenazopyridine  HCl; Take 1 tablet (100 mg total) by mouth 3 (three) times daily as needed for pain.  Dispense: 10 tablet; Refill: 0 -     Urine Culture  Frequency of urination -     POCT URINALYSIS DIP (CLINITEK) -     Sulfamethoxazole -Trimethoprim ; Take 1 tablet by mouth 2 (two) times daily for 5 days.  Dispense: 10 tablet;  Refill: 0 -     Phenazopyridine  HCl; Take 1 tablet (100 mg total) by mouth 3 (three) times daily as needed for pain.  Dispense: 10 tablet; Refill: 0  Constipation, unspecified constipation type Assessment & Plan: Chronic constipation with no bowel movement reportedly for 2-3 weeks. Differential includes functional constipation versus possible obstruction. - Ordered abdominal x-ray for obstruction assessment. - Initiated Miralax daily. - Recommended Dulcolax every one to three days as needed. - Advised increased water intake. - Ordered colonoscopy for further evaluation.  Orders: -     DG Abd 2 Views; Future -     Ambulatory referral to Gastroenterology  Screen for colon cancer -     Ambulatory referral to Gastroenterology  Mild dementia without behavioral disturbance, psychotic disturbance, mood disturbance, or anxiety, unspecified dementia type [facility]) Assessment & Plan: Have ensured patient has updated list of her medications and is familiar with what she is taking / taking it for. Her 2 sons live with her and help her around the house and with ADLs when necessary.      Return if symptoms worsen or fail to improve.  Christina JULIANNA Sacks, PA-C  "

## 2024-12-21 NOTE — Patient Instructions (Signed)
 VISIT SUMMARY: During your visit, we addressed your urinary tract infection symptoms, prolonged absence of bowel movements, and unintentional weight loss. We have started treatment for the urinary tract infection and initiated a plan to manage your constipation. Additionally, we have scheduled a colonoscopy for further evaluation and screening.  YOUR PLAN: ACUTE CYSTITIS (URINARY TRACT INFECTION): You have a confirmed bacterial infection in your urinary tract. -Take Bactrim  twice daily for 5 days with food. -We have sent your urine for culture to confirm the bacterial sensitivity. -Take Pyridium  for bladder pain relief , but be aware it may discolor your urine. -Complete the full course of antibiotics even if your symptoms improve. -Contact us  if your symptoms persist after finishing the antibiotics.  CONSTIPATION: You have not had a bowel movement for over a month, which may be due to functional constipation or a possible obstruction. -We have ordered an abdominal x-ray to check for any obstruction. -Start taking Miralax daily. -You can take Dulcolax every three days as needed until you produce a bowel movement.  -Increase your water intake. -We have ordered a colonoscopy for further evaluation. [facility] [address] [place], [place] [address] Phone [phone]  COLON CANCER SCREENING: You are due for a colon cancer screening. -We have ordered a colonoscopy for screening.  If you have any problems before your next visit feel free to message me via MyChart (minor issues or questions) or call the office, otherwise you may reach out to schedule an office visit.  Thank you! Saddie Sacks, PA-C

## 2024-12-21 NOTE — Assessment & Plan Note (Signed)
 Confirmed bacterial infection by urinalysis. Early detection allows prompt treatment. - Prescribed Bactrim  twice daily for 5 days with food. - Sent urine for culture to confirm bacterial sensitivity. - Prescribed Pyridium  for bladder pain relief , noted urine discoloration. - Advised to complete antibiotics even if symptoms improve. - Instructed to contact if symptoms persist post-antibiotics.

## 2024-12-21 NOTE — Assessment & Plan Note (Signed)
 Chronic constipation with no bowel movement reportedly for 2-3 weeks. Differential includes functional constipation versus possible obstruction. - Ordered abdominal x-ray for obstruction assessment. - Initiated Miralax daily. - Recommended Dulcolax every one to three days as needed. - Advised increased water intake. - Ordered colonoscopy for further evaluation.

## 2024-12-23 ENCOUNTER — Ambulatory Visit (HOSPITAL_COMMUNITY): Admission: RE | Admit: 2024-12-23 | Attending: Cardiology

## 2024-12-24 ENCOUNTER — Ambulatory Visit: Payer: Self-pay

## 2024-12-25 LAB — URINE CULTURE

## 2024-12-28 ENCOUNTER — Telehealth: Payer: Self-pay | Admitting: *Deleted

## 2024-12-28 ENCOUNTER — Other Ambulatory Visit: Payer: Self-pay

## 2024-12-28 DIAGNOSIS — R350 Frequency of micturition: Secondary | ICD-10-CM

## 2024-12-28 DIAGNOSIS — N3000 Acute cystitis without hematuria: Secondary | ICD-10-CM

## 2024-12-28 MED ORDER — SULFAMETHOXAZOLE-TRIMETHOPRIM 800-160 MG PO TABS: 1.0000 | ORAL_TABLET | Freq: Two times a day (BID) | ORAL | 0 refills | Status: DC

## 2024-12-28 MED ORDER — PHENAZOPYRIDINE HCL 100 MG PO TABS: 100.0000 mg | ORAL_TABLET | Freq: Three times a day (TID) | ORAL | 0 refills | Status: AC | PRN

## 2024-12-28 NOTE — Telephone Encounter (Signed)
 I tried calling her son Christina Hardin but couldn't get in touch. Would you mind giving her son a call and filling him in on Pam.   Let him know she was in the office last week for a UTI, antibiotics were sent in and patient is now reporting that she never picked them up and does not even remember coming to the office.  I also placed an order at [facility] for x-ray of her belly because patient reported she had not had a bowel movement in several weeks.   I want Christina Hardin to be aware so that he can take her to pick up the antibiotic and go get the imaging done later this week.   Also advise that if patient is acting weak, running a fever, or more confused than baseline, she needs to go to the ED immediately.

## 2024-12-28 NOTE — Telephone Encounter (Signed)
 Copied from CRM #[id number]. Topic: Clinical - Lab/Test Results >> Dec 28, 2024  9:37 AM Darshell M wrote: Reason for CRM: Patient returning call regarding labs. Patient says she has not taken antibiotics for the UTI as she is not having a problem. Patient was not aware of belly x-ray but was advised of CT appointment scheduled 12/30/2024. Patient seemed unclear about plan of care.

## 2024-12-28 NOTE — Telephone Encounter (Signed)
 Called and LVM/ related message from provider.

## 2024-12-28 NOTE — Telephone Encounter (Signed)
 I called pt and informed her of her results and she said that she had not taken any medicine and that she was going to try and get her son to take her to pick it up. She stated that she was weak and had no energy and wasn't eating a lot.  Informed her that it could be because she had not started taking her medication.  Told her I would send a message to PCP for any more recommendations.

## 2024-12-28 NOTE — Telephone Encounter (Signed)
 Called and spoke with patient; she's aware of the antibiotic (she's waiting on her son to pick it up for her today) and the x-ray of belly at [facility].

## 2024-12-29 ENCOUNTER — Other Ambulatory Visit: Payer: Self-pay

## 2024-12-29 ENCOUNTER — Ambulatory Visit: Payer: Self-pay

## 2024-12-29 NOTE — Telephone Encounter (Signed)
 Called [facility] and spoke with Rosina about patient's symptoms and recommendation of ER--Unsure if patient is going to go at this time

## 2024-12-29 NOTE — Telephone Encounter (Signed)
 Adding this note in for thorough documentation purposes:   Patient was seen in office last week by herself. Dysuria and UTI symptoms. POC UA revealed signs of UTI. Discussed UTI and sent in Bactrim  at the time of visit. Patient also verbalized at this visit that she had not had a bowel movement in a few weeks. Denied any pain at the time. Physical exam unremarkable. Discussed with patient the causes of constipation, including blockage. Discussed that I wanted an x-ray of her belly and that the order was placed to [facility]. I gave her a written address and phone number for imaging center and advised her to go as soon as she left the office. She verbalized understanding at  time of visit and was in agreement with the plan.   Urine culture came back positive for E Coli and sensitive to Bactrim . Patient was informed of this, advised to finish antibiotic.   Patient then contacts office yesterday c/o weakness. Reports that she does not remember being seen for a UTI and did not know medication had been sent in for it. I attempted to call her children with no answer. CMA made contact with son yesterday. Was advised to pick up her antibiotic and start it immediately. I resent both scripts yesterday since the pharmacy had likely put them back. Also advised that if patient is having fever, increased weakness, low BP, or more confusion outside from baseline, her children should take her to the ER immediately to rule out sepsis or worsening urinary tract infection.   With patient seeming weak on the phone with E2C2 and soft blood pressure, I HIGHLY recommend she go to the ER today.   Staff: can we try again to make contact with one of her son's and advise them of the above? Thank you!   Christina JULIANNA Sacks, PA-C

## 2024-12-29 NOTE — Telephone Encounter (Signed)
 FYI Only or Action Required?: FYI only for provider: Unsure about ER Refusal at this time.  Patient was last seen in primary care on 12/21/2024 by Gayle Saddie FALCON, PA-C.  Called Nurse Triage reporting Weakness.  Symptoms began several weeks ago.  Interventions attempted: Nothing.  Symptoms are: gradually worsening.  Triage Disposition: Go to ED Now (or PCP Triage)  Patient/caregiver understands and will follow disposition?: Unsure          Message from [place] E sent at 12/29/2024 10:03 AM EST  Summary: Body weakness   Reason for Triage: Patient is feeling really weak. Patient's blood pressure is 91/75 from this morning.      Reason for Disposition  Patient sounds very sick or weak to the triager  Answer Assessment - Initial Assessment Questions Patient called and advised that she is very weak today. She was advised that the office wanted her to  She states she still has not had a bowel movement for quite some time Patient states she is breathing quite heavily  Patient denies any known fever, vomiting, abdominal pain  101/83 bp during triage with a pulse of 77 Blood pressure was 91/75 prior to speaking with this RN for triage Patient states she has the Pyridium  but has not taken it yet. Patient states she has some Ex-Lax but hasn't taken that yet either. Patient is unsure if she has the antibiotic that was prescribed yesterday Patient was advised if she was feeling severely weak and with her PCP's recommendations from yesterday---the ER is recommended at this time for further evaluation. Patient states she is also supposed to get an Xray of her abdomen at some point. Patient's son got on the phone and found the antibiotic that was prescribed yesterday.  Patient has not started taking this either. He was advised of patient's symptoms and PCP's recommendations based on yesterday. He states that he will discuss it with the patient (his mother) He and the patient  are advised to call back if anything changes, or with any further questions/concerns. Patient's son is also advised that if anything worsens to call 911. Patient's son verbalized understanding.  Protocols used: Weakness (Generalized) and Fatigue-A-AH

## 2024-12-30 ENCOUNTER — Ambulatory Visit (HOSPITAL_COMMUNITY)

## 2024-12-30 ENCOUNTER — Other Ambulatory Visit: Payer: Self-pay

## 2024-12-31 ENCOUNTER — Other Ambulatory Visit: Payer: Self-pay

## 2024-12-31 ENCOUNTER — Encounter (HOSPITAL_COMMUNITY): Payer: Self-pay

## 2024-12-31 ENCOUNTER — Inpatient Hospital Stay (HOSPITAL_COMMUNITY)
Admission: EM | Admit: 2024-12-31 | Discharge: 2025-01-04 | DRG: 683 | Disposition: A | Attending: Family Medicine | Admitting: Family Medicine

## 2024-12-31 ENCOUNTER — Emergency Department (HOSPITAL_COMMUNITY)

## 2024-12-31 DIAGNOSIS — E2740 Unspecified adrenocortical insufficiency: Secondary | ICD-10-CM | POA: Diagnosis present

## 2024-12-31 DIAGNOSIS — I2510 Atherosclerotic heart disease of native coronary artery without angina pectoris: Secondary | ICD-10-CM | POA: Diagnosis present

## 2024-12-31 DIAGNOSIS — Z801 Family history of malignant neoplasm of trachea, bronchus and lung: Secondary | ICD-10-CM

## 2024-12-31 DIAGNOSIS — R627 Adult failure to thrive: Secondary | ICD-10-CM | POA: Diagnosis present

## 2024-12-31 DIAGNOSIS — R630 Anorexia: Secondary | ICD-10-CM | POA: Diagnosis present

## 2024-12-31 DIAGNOSIS — D509 Iron deficiency anemia, unspecified: Secondary | ICD-10-CM | POA: Diagnosis present

## 2024-12-31 DIAGNOSIS — Z955 Presence of coronary angioplasty implant and graft: Secondary | ICD-10-CM

## 2024-12-31 DIAGNOSIS — Z885 Allergy status to narcotic agent status: Secondary | ICD-10-CM

## 2024-12-31 DIAGNOSIS — Z8249 Family history of ischemic heart disease and other diseases of the circulatory system: Secondary | ICD-10-CM

## 2024-12-31 DIAGNOSIS — N179 Acute kidney failure, unspecified: Secondary | ICD-10-CM | POA: Diagnosis not present

## 2024-12-31 DIAGNOSIS — K760 Fatty (change of) liver, not elsewhere classified: Secondary | ICD-10-CM | POA: Diagnosis present

## 2024-12-31 DIAGNOSIS — G4730 Sleep apnea, unspecified: Secondary | ICD-10-CM | POA: Diagnosis present

## 2024-12-31 DIAGNOSIS — Z79899 Other long term (current) drug therapy: Secondary | ICD-10-CM

## 2024-12-31 DIAGNOSIS — Z90710 Acquired absence of both cervix and uterus: Secondary | ICD-10-CM

## 2024-12-31 DIAGNOSIS — F0390 Unspecified dementia without behavioral disturbance: Secondary | ICD-10-CM | POA: Diagnosis present

## 2024-12-31 DIAGNOSIS — F419 Anxiety disorder, unspecified: Secondary | ICD-10-CM | POA: Diagnosis present

## 2024-12-31 DIAGNOSIS — Z6829 Body mass index (BMI) 29.0-29.9, adult: Secondary | ICD-10-CM

## 2024-12-31 DIAGNOSIS — Z860100 Personal history of colon polyps, unspecified: Secondary | ICD-10-CM

## 2024-12-31 DIAGNOSIS — E878 Other disorders of electrolyte and fluid balance, not elsewhere classified: Secondary | ICD-10-CM | POA: Diagnosis present

## 2024-12-31 DIAGNOSIS — F03A0 Unspecified dementia, mild, without behavioral disturbance, psychotic disturbance, mood disturbance, and anxiety: Secondary | ICD-10-CM | POA: Diagnosis present

## 2024-12-31 DIAGNOSIS — Z87440 Personal history of urinary (tract) infections: Secondary | ICD-10-CM

## 2024-12-31 DIAGNOSIS — D638 Anemia in other chronic diseases classified elsewhere: Secondary | ICD-10-CM | POA: Diagnosis present

## 2024-12-31 DIAGNOSIS — Z823 Family history of stroke: Secondary | ICD-10-CM

## 2024-12-31 DIAGNOSIS — Z853 Personal history of malignant neoplasm of breast: Secondary | ICD-10-CM

## 2024-12-31 DIAGNOSIS — Z806 Family history of leukemia: Secondary | ICD-10-CM

## 2024-12-31 DIAGNOSIS — E860 Dehydration: Principal | ICD-10-CM | POA: Diagnosis present

## 2024-12-31 DIAGNOSIS — I959 Hypotension, unspecified: Secondary | ICD-10-CM | POA: Diagnosis present

## 2024-12-31 DIAGNOSIS — Z803 Family history of malignant neoplasm of breast: Secondary | ICD-10-CM

## 2024-12-31 DIAGNOSIS — Z87442 Personal history of urinary calculi: Secondary | ICD-10-CM

## 2024-12-31 DIAGNOSIS — Z9884 Bariatric surgery status: Secondary | ICD-10-CM

## 2024-12-31 DIAGNOSIS — E8729 Other acidosis: Secondary | ICD-10-CM | POA: Diagnosis not present

## 2024-12-31 DIAGNOSIS — Z7901 Long term (current) use of anticoagulants: Secondary | ICD-10-CM

## 2024-12-31 DIAGNOSIS — Z8261 Family history of arthritis: Secondary | ICD-10-CM

## 2024-12-31 DIAGNOSIS — Z9221 Personal history of antineoplastic chemotherapy: Secondary | ICD-10-CM

## 2024-12-31 DIAGNOSIS — R7401 Elevation of levels of liver transaminase levels: Secondary | ICD-10-CM | POA: Diagnosis present

## 2024-12-31 DIAGNOSIS — Z888 Allergy status to other drugs, medicaments and biological substances status: Secondary | ICD-10-CM

## 2024-12-31 DIAGNOSIS — Z825 Family history of asthma and other chronic lower respiratory diseases: Secondary | ICD-10-CM

## 2024-12-31 DIAGNOSIS — K2100 Gastro-esophageal reflux disease with esophagitis, without bleeding: Secondary | ICD-10-CM | POA: Diagnosis present

## 2024-12-31 DIAGNOSIS — Z808 Family history of malignant neoplasm of other organs or systems: Secondary | ICD-10-CM

## 2024-12-31 DIAGNOSIS — R82998 Other abnormal findings in urine: Secondary | ICD-10-CM | POA: Diagnosis present

## 2024-12-31 DIAGNOSIS — N390 Urinary tract infection, site not specified: Secondary | ICD-10-CM | POA: Diagnosis present

## 2024-12-31 DIAGNOSIS — Z87891 Personal history of nicotine dependence: Secondary | ICD-10-CM

## 2024-12-31 DIAGNOSIS — E669 Obesity, unspecified: Secondary | ICD-10-CM | POA: Diagnosis present

## 2024-12-31 DIAGNOSIS — I480 Paroxysmal atrial fibrillation: Secondary | ICD-10-CM | POA: Diagnosis present

## 2024-12-31 DIAGNOSIS — Z86000 Personal history of in-situ neoplasm of breast: Secondary | ICD-10-CM

## 2024-12-31 DIAGNOSIS — I10 Essential (primary) hypertension: Secondary | ICD-10-CM | POA: Diagnosis present

## 2024-12-31 DIAGNOSIS — E875 Hyperkalemia: Secondary | ICD-10-CM | POA: Diagnosis present

## 2024-12-31 LAB — COMPREHENSIVE METABOLIC PANEL WITH GFR
ALT: 23 U/L (ref 0–44)
AST: 33 U/L (ref 15–41)
Albumin: 4.3 g/dL (ref 3.5–5.0)
Alkaline Phosphatase: 99 U/L (ref 38–126)
Anion gap: 12 (ref 5–15)
BUN: 26 mg/dL — ABNORMAL HIGH (ref 8–23)
CO2: 19 mmol/L — ABNORMAL LOW (ref 22–32)
Calcium: 13.5 mg/dL (ref 8.9–10.3)
Chloride: 105 mmol/L (ref 98–111)
Creatinine, Ser: 2.64 mg/dL — ABNORMAL HIGH (ref 0.44–1.00)
GFR, Estimated: 18 mL/min — ABNORMAL LOW
Glucose, Bld: 92 mg/dL (ref 70–99)
Potassium: 5.3 mmol/L — ABNORMAL HIGH (ref 3.5–5.1)
Sodium: 137 mmol/L (ref 135–145)
Total Bilirubin: 0.8 mg/dL (ref 0.0–1.2)
Total Protein: 7.5 g/dL (ref 6.5–8.1)

## 2024-12-31 LAB — CBC
HCT: 39.7 % (ref 36.0–46.0)
Hemoglobin: 13.7 g/dL (ref 12.0–15.0)
MCH: 33.8 pg (ref 26.0–34.0)
MCHC: 34.5 g/dL (ref 30.0–36.0)
MCV: 98 fL (ref 80.0–100.0)
Platelets: 168 10*3/uL (ref 150–400)
RBC: 4.05 MIL/uL (ref 3.87–5.11)
RDW: 13.2 % (ref 11.5–15.5)
WBC: 7.1 10*3/uL (ref 4.0–10.5)
nRBC: 0 % (ref 0.0–0.2)

## 2024-12-31 LAB — PRO BRAIN NATRIURETIC PEPTIDE: Pro Brain Natriuretic Peptide: 75.1 pg/mL

## 2024-12-31 LAB — PHOSPHORUS: Phosphorus: 2 mg/dL — ABNORMAL LOW (ref 2.5–4.6)

## 2024-12-31 LAB — MAGNESIUM: Magnesium: 2.4 mg/dL (ref 1.7–2.4)

## 2024-12-31 LAB — LACTIC ACID, PLASMA: Lactic Acid, Venous: 1.2 mmol/L (ref 0.5–1.9)

## 2024-12-31 MED ORDER — ONDANSETRON HCL 4 MG/2ML IJ SOLN: 4.0000 mg | Freq: Four times a day (QID) | INTRAMUSCULAR | Status: DC | PRN

## 2024-12-31 MED ORDER — SODIUM CHLORIDE 0.9 % IV BOLUS
1000.0000 mL | Freq: Once | INTRAVENOUS | Status: AC
  Administered 2024-12-31: 1000 mL via INTRAVENOUS

## 2024-12-31 MED ORDER — GABAPENTIN 100 MG PO CAPS
100.0000 mg | ORAL_CAPSULE | Freq: Three times a day (TID) | ORAL | Status: DC
  Administered 2024-12-31 – 2025-01-04 (×12): 100 mg via ORAL
  Filled 2024-12-31 (×12): qty 1

## 2024-12-31 MED ORDER — FLORANEX PO PACK
1.0000 g | PACK | Freq: Three times a day (TID) | ORAL | Status: DC
  Administered 2024-12-31 – 2025-01-04 (×10): 1 g via ORAL
  Filled 2024-12-31 (×14): qty 1

## 2024-12-31 MED ORDER — FLEET ENEMA RE ENEM: 1.0000 | ENEMA | Freq: Once | RECTAL | Status: DC | PRN

## 2024-12-31 MED ORDER — ACETAMINOPHEN 650 MG RE SUPP: 650.0000 mg | Freq: Four times a day (QID) | RECTAL | Status: DC | PRN

## 2024-12-31 MED ORDER — MEGESTROL ACETATE 400 MG/10ML PO SUSP
400.0000 mg | Freq: Every day | ORAL | Status: DC
  Administered 2024-12-31 – 2025-01-04 (×5): 400 mg via ORAL
  Filled 2024-12-31 (×5): qty 10

## 2024-12-31 MED ORDER — ACETAMINOPHEN 325 MG PO TABS
650.0000 mg | ORAL_TABLET | Freq: Four times a day (QID) | ORAL | Status: DC | PRN
  Administered 2025-01-02: 650 mg via ORAL
  Filled 2024-12-31: qty 2

## 2024-12-31 MED ORDER — ROSUVASTATIN CALCIUM 20 MG PO TABS
20.0000 mg | ORAL_TABLET | Freq: Every day | ORAL | Status: DC
  Administered 2024-12-31 – 2025-01-04 (×5): 20 mg via ORAL
  Filled 2024-12-31 (×5): qty 1

## 2024-12-31 MED ORDER — FERROUS SULFATE 325 (65 FE) MG PO TABS
325.0000 mg | ORAL_TABLET | Freq: Every day | ORAL | Status: DC
  Administered 2024-12-31 – 2025-01-04 (×5): 325 mg via ORAL
  Filled 2024-12-31 (×5): qty 1

## 2024-12-31 MED ORDER — SODIUM CHLORIDE 0.9 % IV SOLN: INTRAVENOUS | Status: DC

## 2024-12-31 MED ORDER — MELATONIN 5 MG PO TABS
5.0000 mg | ORAL_TABLET | Freq: Every day | ORAL | Status: DC
  Administered 2024-12-31 – 2025-01-03 (×4): 5 mg via ORAL
  Filled 2024-12-31 (×4): qty 1

## 2024-12-31 MED ORDER — TRAZODONE HCL 50 MG PO TABS
25.0000 mg | ORAL_TABLET | Freq: Every evening | ORAL | Status: DC | PRN
  Administered 2024-12-31 – 2025-01-03 (×2): 25 mg via ORAL
  Filled 2024-12-31 (×3): qty 1

## 2024-12-31 MED ORDER — ALPRAZOLAM 0.25 MG PO TABS
0.2500 mg | ORAL_TABLET | Freq: Three times a day (TID) | ORAL | Status: DC | PRN
  Administered 2025-01-02 – 2025-01-03 (×2): 0.25 mg via ORAL
  Filled 2024-12-31 (×2): qty 1

## 2024-12-31 MED ORDER — RIVAROXABAN 10 MG PO TABS
20.0000 mg | ORAL_TABLET | Freq: Every day | ORAL | Status: DC
  Administered 2024-12-31: 20 mg via ORAL
  Filled 2024-12-31: qty 2

## 2024-12-31 MED ORDER — SODIUM CHLORIDE 0.9% FLUSH
3.0000 mL | Freq: Two times a day (BID) | INTRAVENOUS | Status: DC
  Administered 2024-12-31 – 2025-01-04 (×8): 3 mL via INTRAVENOUS

## 2024-12-31 MED ORDER — IPRATROPIUM BROMIDE 0.02 % IN SOLN: 0.5000 mg | Freq: Four times a day (QID) | RESPIRATORY_TRACT | Status: DC | PRN

## 2024-12-31 MED ORDER — ONDANSETRON HCL 4 MG PO TABS: 4.0000 mg | ORAL_TABLET | Freq: Four times a day (QID) | ORAL | Status: DC | PRN

## 2024-12-31 MED ORDER — OXYCODONE HCL 5 MG PO TABS: 5.0000 mg | ORAL_TABLET | ORAL | Status: DC | PRN

## 2024-12-31 MED ORDER — METOPROLOL SUCCINATE ER 25 MG PO TB24
12.5000 mg | ORAL_TABLET | Freq: Every day | ORAL | Status: DC
  Filled 2024-12-31: qty 1

## 2024-12-31 MED ORDER — HEPARIN SODIUM (PORCINE) 5000 UNIT/ML IJ SOLN: 5000.0000 [IU] | Freq: Three times a day (TID) | INTRAMUSCULAR | Status: DC

## 2024-12-31 MED ORDER — SENNOSIDES-DOCUSATE SODIUM 8.6-50 MG PO TABS: 1.0000 | ORAL_TABLET | Freq: Every evening | ORAL | Status: DC | PRN

## 2024-12-31 MED ORDER — HYDRALAZINE HCL 20 MG/ML IJ SOLN: 10.0000 mg | INTRAMUSCULAR | Status: DC | PRN

## 2024-12-31 MED ORDER — SODIUM CHLORIDE 0.9% FLUSH: 3.0000 mL | Freq: Two times a day (BID) | INTRAVENOUS | Status: DC

## 2024-12-31 NOTE — Assessment & Plan Note (Signed)
 Denies any chest pain -Continue metoprolol  low-dose, continue rosuvastatin 

## 2024-12-31 NOTE — Assessment & Plan Note (Signed)
 Hypotensive, holding BP meds as above

## 2024-12-31 NOTE — Assessment & Plan Note (Signed)
-   Stable mood,  - Not on any medication with exception of Neurontin

## 2024-12-31 NOTE — Assessment & Plan Note (Signed)
 Rate controlled, continue metoprolol  lower dose due to hypotension -Continue Xarelto 

## 2024-12-31 NOTE — ED Triage Notes (Signed)
 Pt BIB [facility] from home d/t decreased oral intake the last few days, reports feeling very weak & unable to ambulate around as well. A/Ox4, 100/60, 64 bpm, 99% on RA, CBG 152, was given 200cc NS via 20g Lt AC PIV.

## 2024-12-31 NOTE — Assessment & Plan Note (Addendum)
 History of anemia of chronic disease, iron  deficiency anemia -H&H today -Continue iron  supplements -Monitoring

## 2024-12-31 NOTE — Assessment & Plan Note (Signed)
 Hypertensive-with history of hypertension POA: hypotensive, BP as low as 91/66 Current blood pressure 99/70, with a pulse of 50 -Likely due to dehydration, poor p.o. intake -But holding home BP meds: Including losartan , amlodipine , metoprolol 

## 2024-12-31 NOTE — Assessment & Plan Note (Addendum)
-  likely due to dehydration, poor p.o. intake -Continue with IV fluid  -Monitor BUN/creatinine closely -Avoiding nephrotoxins, hypotension

## 2024-12-31 NOTE — H&P (Signed)
 " History and Physical   Patient: Christina Hardin                            PCP: Gayle Saddie FALCON, PA-C                    DOB: June 23, 1951            DOA: 12/31/2024 FMW:[id number]             DOS: 12/31/2024, 5:08 PM  Gayle Saddie FALCON, PA-C  Patient coming from:   HOME  I have personally reviewed patient's medical records, in electronic medical records, including:  [place] link, and care everywhere.    Chief Complaint:   Chief Complaint  Patient presents with   Decreased PO intake   Weakness    History of present illness:    Christina Hardin is a 74 y.o Female with history of HTN, GERD, CAD, iron  deficiency anemia, breast cancer, Afib (on Xarelto ), obesity s/p bariatric surgery.  Recent UTI with Klebsiella, was treated with Bactrim .  Presenting with poor appetite, progressive generalized weaknesses difficulty with ambulation, significantly reduced oral intake Denied of having any fever, chills, dysuria.   ED Evaluation: Blood pressure 99/70, pulse (!) 59, temperature 98 F (36.7 C), temperature source Oral, resp. rate 17, height 5' 4 (1.626 m), weight 77.1 kg, SpO2 100%. LABs: Potassium 5.3, CO2 19, BUN 26, Creatinine 2.64, GFR of 18, calcium  13.5 UA: Pending Chest x-ray:No acute cardiopulmonary process.  Postsurgical changes on left breast   Patient Denies having: Fever, Chills, Cough, SOB, Chest Pain, Abd pain, N/V/D, headache, dizziness, lightheadedness,  Dysuria, Joint pain, rash, open wounds    Review of Systems: As per HPI, otherwise 10 point review of systems were negative.   ----------------------------------------------------------------------------------------------------------------------  Allergies[1]  Home MEDs:  Prior to Admission medications  Medication Sig Start Date End Date Taking? Authorizing Provider  Ferrous Sulfate  (IRON  PO) Take 1 capsule by mouth daily. Unknown OTC iron  from TV.   Yes [provider]  ferrous sulfate  325 (65 FE) MG tablet  Take 325 mg by mouth daily. 10/29/24  Yes [provider]  gabapentin  (NEURONTIN ) 100 MG capsule Take 1 capsule (100 mg total) by mouth 3 (three) times daily. 08/24/24  Yes Cleotilde Perkins, DO  ibuprofen (ADVIL) 200 MG tablet Take 400 mg by mouth 2 (two) times daily as needed (pain).   Yes [provider]  losartan  (COZAAR ) 100 MG tablet Take 100 mg by mouth daily.   Yes [provider]  losartan  (COZAAR ) 50 MG tablet Take 1 tablet (50 mg total) by mouth daily. Hold if Systolic Blood Pressure (top number) is less than 120. 12/16/24 03/16/25 Yes Tolia, Sunit, DO  metoprolol  succinate (TOPROL -XL) 25 MG 24 hr tablet TAKE 1 TABLET (25 MG TOTAL) BY MOUTH DAILY. 10/12/24  Yes Cleotilde Perkins, DO  phenazopyridine  (PYRIDIUM ) 100 MG tablet Take 1 tablet (100 mg total) by mouth 3 (three) times daily as needed for pain. 12/28/24  Yes Clapp, Kara F, PA-C  pramipexole  (MIRAPEX ) 0.125 MG tablet Take 0.125 mg by mouth daily.   Yes [provider]  rivaroxaban  (XARELTO ) 20 MG TABS tablet Take 1 tablet (20 mg total) by mouth daily with supper. 09/25/24  Yes Janna Ferrier, DO  rosuvastatin  (CRESTOR ) 20 MG tablet Take 1 tablet (20 mg total) by mouth daily. 03/24/24  Yes McDiarmid, Krystal BIRCH, MD  Sennosides (EX-LAX PO) Take 1 tablet  by mouth daily as needed (constipation).   Yes [provider]  amLODipine  (NORVASC ) 2.5 MG tablet Take 2.5 mg by mouth daily.    [provider]  gabapentin  (NEURONTIN ) 300 MG capsule TAKE 2 CAPSULES BY MOUTH 3 TIMES A DAY Patient not taking: Reported on 12/31/2024 10/19/24   Chandra Toribio POUR, MD  spironolactone  (ALDACTONE ) 25 MG tablet Take 25 mg by mouth daily. Patient not taking: Reported on 12/31/2024    [provider]  sulfamethoxazole -trimethoprim  (BACTRIM  DS) 800-160 MG tablet Take 1 tablet by mouth 2 (two) times daily. Patient not taking: Reported on 12/31/2024 12/28/24   Gayle Saddie FALCON, PA-C    PRN MEDs: acetaminophen  **OR**  acetaminophen , ALPRAZolam , ipratropium, ondansetron  **OR** ondansetron  (ZOFRAN ) IV, oxyCODONE , senna-docusate, traZODone   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     History of breast cancer 11/16/2016   History of colon polyps    sessile polyp 2013   NEPHROLITHIASIS 09/17/2008   Qualifier: Diagnosis of   By: Scarlet MD, Elsie         Personal history of chemotherapy 2017   Recurrent UTI 03/10/2024   Reflux esophagitis 01/30/2007   Qualifier: Diagnosis of   By: Jil MD, Josette RAMAN        Renal cyst, right 09/17/2008   Qualifier: Diagnosis of   By: Scarlet MD, Elsie         Rosacea 01/30/2007   Qualifier: Diagnosis of   By: Jil MD, Josette RAMAN        S/P drug eluting coronary stent placement 03/13/2024   [place] CCTA 10/05/2021: Mild to moderate (50-60% diameter stenosis), single discreet stenosis of distal RCA. Minimal luminal narrowing of the LAD arteryCardiac catheterization 10/30/2021: DES to RCA, EF 60%, LVEDP 10 mmHg     Thoracic aortic aneurysm    Thoracic aortic aneurysm     Past Surgical History:  Procedure  Laterality Date   ABDOMINAL HYSTERECTOMY     APPENDECTOMY  1958   BLADDER REPAIR     BREAST BIOPSY Left    BREAST LUMPECTOMY Left 10/2016   BREAST LUMPECTOMY WITH RADIOACTIVE SEED AND SENTINEL LYMPH NODE BIOPSY Left 10/09/2016   Procedure: BREAST LUMPECTOMY WITH RADIOACTIVE SEED AND SENTINEL LYMPH NODE BIOPSY;  Surgeon: Donnice Lima, MD;  Location: [facility];  Service: General;  Laterality: Left;   CARPAL TUNNEL RELEASE Left 08/22/2017   Procedure: LEFT CARPAL TUNNEL RELEASE;  Surgeon: Murrell Kuba, MD;  Location: [facility];  Service: Orthopedics;  Laterality:  Left;   CARPOMETACARPEL SUSPENSION PLASTY Left 08/22/2017   Procedure: SUSPENSIONPLASTY LEFT THUMB TRAPENZIUM EXCISION ABDUCTOR POLLICIS LONGUS;  Surgeon: Murrell Kuba, MD;  Location: [facility];  Service: Orthopedics;  Laterality: Left;   COLONOSCOPY W/ POLYPECTOMY     CORONARY ANGIOPLASTY WITH STENT PLACEMENT     GASTRIC BYPASS     HYSTERECTOMY ABDOMINAL WITH SALPINGECTOMY     ULNAR NERVE TRANSPOSITION Left 08/22/2017   Procedure: ULNAR NERVE DECOMPRESSION;  Surgeon: Murrell Kuba, MD;  Location: [facility];  Service: Orthopedics;  Laterality: Left;     reports that she has quit smoking. Her smoking use included cigarettes. She has been exposed to tobacco smoke. She has never used smokeless tobacco. She reports that she does not drink alcohol and does not use drugs.   Family History  Problem Relation Age of Onset   Atrial fibrillation Mother    Dementia Mother    Hypertension Mother    Gout Mother    Arthritis Mother    Stroke Mother    COPD Father    Emphysema Father    Lung cancer Father    Melanoma Father    Hypertension Sister    Sleep apnea Sister    Hypertension Brother    Hypertension Brother    Heart attack Maternal Aunt    Breast cancer Maternal Aunt        dx over 50   Heart attack Maternal Uncle    Prostate cancer Maternal Uncle        unsure if  this was truly prostate cancer   Heart attack Paternal Uncle    Heart attack Maternal Grandfather    Breast cancer Cousin        mat first cousin   Breast cancer Cousin        mat first cousin   Cancer Cousin        father's maternal first cousin with Breast, Ovarian, Uterine Cancer   Leukemia Cousin        3 of father's maternal first cousin   Leukemia Other        father's maternal uncle   Leukemia Other        father's maternal uncle   Colon cancer Neg Hx    Colon polyps Neg Hx    Esophageal cancer Neg Hx    Pancreatic cancer Neg Hx    Stomach cancer Neg Hx     Physical Exam:   Vitals:   12/31/24 1500 12/31/24 1515 12/31/24 1530 12/31/24 1659  BP: (!) 101/90 99/70 91/68  110/77  Pulse: 62 (!) 59 (!) 116 (!) 59  Resp: 17 17 13 18   Temp:    98.7 F (37.1 C)  TempSrc:    Oral  SpO2: 100% 100% (!) 88%   Weight:      Height:       Constitutional: NAD, calm, comfortable Eyes: PERRL, lids and conjunctivae normal ENMT: Mucous membranes are moist. Posterior pharynx clear of any exudate or lesions.Normal dentition.  Neck: normal, supple, no masses, no thyromegaly Respiratory: clear to auscultation bilaterally, no wheezing, no crackles. Normal respiratory effort. No accessory muscle use.  Cardiovascular: Regular rate and rhythm, no murmurs / rubs / gallops. No extremity edema. 2+ pedal pulses. No carotid bruits.  Abdomen: no tenderness, no masses palpated. No hepatosplenomegaly. Bowel sounds positive.  Musculoskeletal: no clubbing / cyanosis. No joint deformity upper and lower extremities. Good ROM, no contractures. Normal muscle tone.  Neurologic: CN II-XII grossly intact. Sensation intact,  DTR normal. Strength 5/5 in all 4.  Psychiatric: Normal judgment and insight. Alert and oriented x 3. Normal mood.  Skin: no rashes, lesions, ulcers. No induration       Labs on admission:    I have personally reviewed following labs and imaging studies  CBC: Recent Labs  Lab  12/31/24 1058  WBC 7.1  HGB 13.7  HCT 39.7  MCV 98.0  PLT 168   Basic Metabolic Panel: Recent Labs  Lab 12/31/24 1058  NA 137  K 5.3*  CL 105  CO2 19*  GLUCOSE 92  BUN 26*  CREATININE 2.64*  CALCIUM  13.5*   GFR: Estimated Creatinine Clearance: 19.1 mL/min (A) (by C-G formula based on SCr of 2.64 mg/dL (H)). Liver Function Tests: Recent Labs  Lab 12/31/24 1058  AST 33  ALT 23  ALKPHOS 99  BILITOT 0.8  PROT 7.5  ALBUMIN 4.3    Urine analysis:    Component Value Date/Time   COLORURINE YELLOW 05/01/2023 0859   APPEARANCEUR CLEAR 05/01/2023 0859   LABSPEC 1.017 05/01/2023 0859   PHURINE 7.0 05/01/2023 0859   GLUCOSEU NEGATIVE 05/01/2023 0859   HGBUR NEGATIVE 05/01/2023 0859   HGBUR large 09/17/2008 1416   BILIRUBINUR negative 12/21/2024 0847   KETONESUR negative 12/21/2024 0847   KETONESUR 20 (A) 05/01/2023 0859   PROTEINUR negative 01/10/2024 1215   PROTEINUR NEGATIVE 05/01/2023 0859   UROBILINOGEN 1.0 12/21/2024 0847   UROBILINOGEN 4.0 09/17/2008 1416   NITRITE Positive (A) 12/21/2024 0847   NITRITE NEGATIVE 05/01/2023 0859   LEUKOCYTESUR Moderate (2+) (A) 12/21/2024 0847   LEUKOCYTESUR NEGATIVE 05/01/2023 0859    Last A1C:  Lab Results  Component Value Date   HGBA1C 5.6 10/12/2024     Radiologic Exams on Admission:   DG Chest 1 View Result Date: 12/31/2024 EXAM: 1 VIEW XRAY OF THE CHEST 12/31/2024 11:20:00 AM COMPARISON: None available. CLINICAL HISTORY: Shortness of breath. FINDINGS: LUNGS AND PLEURA: No focal pulmonary opacity. No pleural effusion. No pneumothorax. HEART AND MEDIASTINUM: No acute abnormality of the cardiac and mediastinal silhouettes. BONES AND SOFT TISSUES: Surgical clips in left breast. No acute osseous abnormality. IMPRESSION: 1. No acute cardiopulmonary process. 2. Postsurgical changes of the left breast with surgical clips. Electronically signed by: Ryan Salvage MD 12/31/2024 12:08 PM EST RP Workstation: [id number]     EKG:   Independently reviewed.  Orders placed or performed during the hospital encounter of 12/31/24   ED EKG   ED EKG   EKG 12-Lead   ---------------------------------------------------------------------------------------------------------------------------------------    Assessment / Plan:   Principal Problem:   AKI (acute kidney injury) Active Problems:   Paroxysmal atrial fibrillation (HCC)   Mild dementia without behavioral disturbance, psychotic disturbance, mood disturbance, or anxiety (HCC)   Hypercalcemia   Hypotensive episode   Anxiety and depression   Anemia, iron  deficiency   Coronary artery disease involving native coronary artery of native heart without angina pectoris   UTI (urinary tract infection)   Essential hypertension   FTT (failure to thrive) in adult   Assessment and Plan: * AKI (acute kidney injury) -likely due to dehydration, poor p.o. intake -Continue with IV fluid  -Monitor BUN/creatinine closely -Avoiding nephrotoxins, hypotension   Hypotensive episode Hypertensive-with history of hypertension POA: hypotensive, BP as low as 91/66 Current blood pressure 99/70, with a pulse of 50 -Likely due to dehydration, poor p.o. intake -But holding home BP meds: Including losartan , amlodipine , metoprolol   Hypercalcemia Acute on chronic hypercalcemia -Serum calcium  at 13.5 (for past 4  years serum calcium  has been running 10-11) - Previous history of breast cancer, no history of recurrent cancers or multiple mild -Continue to investigate underlying causes - IV fluid hydration  Mild dementia without behavioral disturbance, psychotic disturbance, mood disturbance, or anxiety (HCC) Stable, mood stable -Will monitor for delirium with underlying dementia -As needed Haldol, Xanax    Paroxysmal atrial fibrillation (HCC) Rate controlled, continue metoprolol  lower dose due to hypotension -Continue Xarelto   UTI (urinary tract infection) Recent  history of UTI, cultures positive Klebsiella Per patient completed course of antibiotics of Bactrim  (5 days) Repeating UA  Coronary artery disease involving native coronary artery of native heart without angina pectoris Denies any chest pain -Continue metoprolol  low-dose, continue rosuvastatin   Anemia, iron  deficiency History of anemia of chronic disease, iron  deficiency anemia -H&H today -Continue iron  supplements -Monitoring  Anxiety and depression - Stable mood,  - Not on any medication with exception of Neurontin   Essential hypertension Hypotensive, holding BP meds as above  FTT (failure to thrive) in adult -Continue to encourage oral intake, and oral hydration -Initiating appetite failure Megace      Consults called:  None -------------------------------------------------------------------------------------------------------------------------------------------- DVT prophylaxis:  rivaroxaban  (XARELTO ) tablet 20 mg Start: 12/31/24 1700 SCDs Start: 12/31/24 1547 Place TED hose Start: 12/31/24 1547 rivaroxaban  (XARELTO ) tablet 20 mg   Code Status:   Code Status: Full Code   Admission status: Patient will be admitted as Observation, with a greater than 2 midnight length of stay. Level of care: Med-Surg   Family Communication: Tried calling both of her sons -at phone numbers in the chart Was unable to get a hold of them (The above findings and plan of care has been discussed with patient with the patient) i--------------------------------------------------------------------------------------------------------------------------------------------------  Disposition Plan:  Anticipated 1-2 days Status is: Observation The patient remains OBS appropriate and will d/c before 2 midnights.   -------------------------------------------------------------------------------------------------------------------------------------  Time spent:  84  Min.  Was spent seeing and  evaluating the patient, reviewing all medical records, drawn plan of care.  SIGNED: Adriana DELENA Grams, MD, FHM. FAAFP. [place] [facility], Pager  (Please use [email or web address] to page/ or secure chat through epic) If 7PM-7AM, please contact night-coverage [email or web address],  12/31/2024, 5:08 PM     [1]  Allergies Allergen Reactions   Dilaudid  [Hydromorphone  Hcl] Shortness Of Breath   Heme-Iron -Polypeptide [Iron ] Other (See Comments)    Unknown reaction   Darvon-N [Propoxyphene] Anxiety and Other (See Comments)    Hyperventilation    "

## 2024-12-31 NOTE — Assessment & Plan Note (Signed)
 Recent history of UTI, cultures positive Klebsiella Per patient completed course of antibiotics of Bactrim  (5 days) Repeating UA

## 2024-12-31 NOTE — ED Provider Triage Note (Signed)
 Emergency Medicine Provider Triage Evaluation Note  Christina Hardin , a 74 y.o. female  was evaluated in triage.  Pt complains of generalized weakness, decreased oral intake, decreased strength and ability to ambulate generally. Endorses some mild shob, denies chest pain. Denies fever but does report mild chills. Denies nausea, vomiting. No new numbness/ tingling.  Review of Systems  Positive: weakness Negative:   Physical Exam  BP 99/71 (BP Location: Left Arm)   Pulse 65   Temp 97.8 F (36.6 C) (Oral)   Resp 16   Ht 5' 4 (1.626 m)   Wt 77.1 kg   SpO2 100%   BMI 29.18 kg/m  Gen:   Awake, no distress   Resp:  Normal effort  MSK:   Moves extremities without difficulty  Other:    Medical Decision Making  Medically screening exam initiated at 10:48 AM.  Appropriate orders placed.  Christina Hardin was informed that the remainder of the evaluation will be completed by another provider, this initial triage assessment does not replace that evaluation, and the importance of remaining in the ED until their evaluation is complete.  Workup initiated in triage    Christina Hardin, [place] 12/31/24 1048

## 2024-12-31 NOTE — Assessment & Plan Note (Signed)
-  Continue to encourage oral intake, and oral hydration -Initiating appetite failure Megace

## 2024-12-31 NOTE — Assessment & Plan Note (Signed)
 Stable, mood stable -Will monitor for delirium with underlying dementia -As needed Haldol, Xanax 

## 2024-12-31 NOTE — Hospital Course (Addendum)
 Christina Hardin is a 74 y.o Female with history of HTN, GERD, CAD, iron  deficiency anemia, breast cancer, Afib (on Xarelto ), obesity s/p bariatric surgery.  Recent UTI with Klebsiella, was treated with Bactrim .  Presenting with poor appetite, progressive generalized weaknesses difficulty with ambulation, significantly reduced oral intake Denied of having any fever, chills, dysuria.   ED Evaluation: Blood pressure 99/70, pulse (!) 59, temperature 98 F (36.7 C), temperature source Oral, resp. rate 17, height 5' 4 (1.626 m), weight 77.1 kg, SpO2 100%. LABs: Potassium 5.3, CO2 19, BUN 26, Creatinine 2.64, GFR of 18, calcium  13.5 UA: Pending Chest x-ray:No acute cardiopulmonary process.  Postsurgical changes on left breast

## 2024-12-31 NOTE — Assessment & Plan Note (Signed)
 Acute on chronic hypercalcemia -Serum calcium  at 13.5 (for past 4 years serum calcium  has been running 10-11) - Previous history of breast cancer, no history of recurrent cancers or multiple mild -Continue to investigate underlying causes - IV fluid hydration

## 2024-12-31 NOTE — ED Provider Notes (Signed)
 " [place] EMERGENCY DEPARTMENT AT [facility] Provider Note   CSN: [id number] Arrival date & time: 12/31/24  1029     Patient presents with: Decreased PO intake and Weakness   Christina Hardin is a 74 y.o. female.   Pt is a 74 yo female with pmhx significant for breast cancer, afib (on Xarelto ), HTN, GERD, CAD, obesity s/p bariatric surgery.  Pt has not had much of an appetite for about a week.  She has not had n/v.  She and her son think she is on too many medications.  She normally takes metoprolol  and did not take it today due to the weakness and BP in the 90s.  She did have a uti last week, but took her meds and no longer has sx.       Prior to Admission medications  Medication Sig Start Date End Date Taking? Authorizing Provider  Ferrous Sulfate  (IRON  PO) Take 1 capsule by mouth daily. Unknown OTC iron  from TV.   Yes [provider]  ferrous sulfate  325 (65 FE) MG tablet Take 325 mg by mouth daily. 10/29/24  Yes [provider]  gabapentin  (NEURONTIN ) 100 MG capsule Take 1 capsule (100 mg total) by mouth 3 (three) times daily. 08/24/24  Yes Cleotilde Perkins, DO  ibuprofen (ADVIL) 200 MG tablet Take 400 mg by mouth 2 (two) times daily as needed (pain).   Yes [provider]  losartan  (COZAAR ) 100 MG tablet Take 100 mg by mouth daily.   Yes [provider]  losartan  (COZAAR ) 50 MG tablet Take 1 tablet (50 mg total) by mouth daily. Hold if Systolic Blood Pressure (top number) is less than 120. 12/16/24 03/16/25 Yes Tolia, Sunit, DO  metoprolol  succinate (TOPROL -XL) 25 MG 24 hr tablet TAKE 1 TABLET (25 MG TOTAL) BY MOUTH DAILY. 10/12/24  Yes Cleotilde Perkins, DO  phenazopyridine  (PYRIDIUM ) 100 MG tablet Take 1 tablet (100 mg total) by mouth 3 (three) times daily as needed for pain. 12/28/24  Yes Clapp, Kara F, PA-C  pramipexole  (MIRAPEX ) 0.125 MG tablet Take 0.125 mg by mouth daily.   Yes [provider]  rivaroxaban  (XARELTO ) 20 MG TABS  tablet Take 1 tablet (20 mg total) by mouth daily with supper. 09/25/24  Yes Janna Ferrier, DO  rosuvastatin  (CRESTOR ) 20 MG tablet Take 1 tablet (20 mg total) by mouth daily. 03/24/24  Yes McDiarmid, Krystal BIRCH, MD  Sennosides (EX-LAX PO) Take 1 tablet by mouth daily as needed (constipation).   Yes [provider]  amLODipine  (NORVASC ) 2.5 MG tablet Take 2.5 mg by mouth daily.    [provider]  gabapentin  (NEURONTIN ) 300 MG capsule TAKE 2 CAPSULES BY MOUTH 3 TIMES A DAY Patient not taking: Reported on 12/31/2024 10/19/24   Chandra Toribio POUR, MD  spironolactone  (ALDACTONE ) 25 MG tablet Take 25 mg by mouth daily. Patient not taking: Reported on 12/31/2024    [provider]  sulfamethoxazole -trimethoprim  (BACTRIM  DS) 800-160 MG tablet Take 1 tablet by mouth 2 (two) times daily. Patient not taking: Reported on 12/31/2024 12/28/24   Gayle Saddie FALCON, PA-C    Allergies: Dilaudid  [hydromorphone  hcl], Heme-iron -polypeptide [iron ], and Darvon-n [propoxyphene]    Review of Systems  Neurological:  Positive for weakness.  All other systems reviewed and are negative.   Updated Vital Signs BP 107/65   Pulse (!) 51   Temp 97.9 F (36.6 C)   Resp 17   Ht 5' 4 (1.626 m)   Wt 76.7 kg  SpO2 99%   BMI 29.02 kg/m   Physical Exam Vitals and nursing note reviewed.  Constitutional:      Appearance: Normal appearance.  HENT:     Head: Normocephalic and atraumatic.     Right Ear: External ear normal.     Left Ear: External ear normal.     Nose: Nose normal.     Mouth/Throat:     Mouth: Mucous membranes are moist.     Pharynx: Oropharynx is clear.  Eyes:     Extraocular Movements: Extraocular movements intact.     Conjunctiva/sclera: Conjunctivae normal.     Pupils: Pupils are equal, round, and reactive to light.  Cardiovascular:     Rate and Rhythm: Normal rate and regular rhythm.     Pulses: Normal pulses.     Heart sounds: Normal heart sounds.  Pulmonary:     Effort:  Pulmonary effort is normal.     Breath sounds: Normal breath sounds.  Abdominal:     General: Abdomen is flat. Bowel sounds are normal.     Palpations: Abdomen is soft.  Musculoskeletal:        General: Normal range of motion.     Cervical back: Normal range of motion and neck supple.  Skin:    General: Skin is warm.     Capillary Refill: Capillary refill takes less than 2 seconds.  Neurological:     General: No focal deficit present.     Mental Status: She is alert and oriented to person, place, and time.     Comments: Generalized weakness  Psychiatric:        Mood and Affect: Mood normal.        Behavior: Behavior normal.     (all labs ordered are listed, but only abnormal results are displayed) Labs Reviewed  COMPREHENSIVE METABOLIC PANEL WITH GFR - Abnormal; Notable for the following components:      Result Value   Potassium 5.3 (*)    CO2 19 (*)    BUN 26 (*)    Creatinine, Ser 2.64 (*)    Calcium  13.5 (*)    GFR, Estimated 18 (*)    All other components within normal limits  URINALYSIS, ROUTINE W REFLEX MICROSCOPIC - Abnormal; Notable for the following components:   Nitrite POSITIVE (*)    Bacteria, UA FEW (*)    All other components within normal limits  PHOSPHORUS - Abnormal; Notable for the following components:   Phosphorus 2.0 (*)    All other components within normal limits  BASIC METABOLIC PANEL WITH GFR - Abnormal; Notable for the following components:   Chloride 112 (*)    CO2 18 (*)    BUN 24 (*)    Creatinine, Ser 2.12 (*)    Calcium  11.3 (*)    GFR, Estimated 24 (*)    All other components within normal limits  CBC - Abnormal; Notable for the following components:   RBC 3.14 (*)    Hemoglobin 10.6 (*)    HCT 31.2 (*)    Platelets 130 (*)    All other components within normal limits  GLUCOSE, CAPILLARY - Abnormal; Notable for the following components:   Glucose-Capillary 69 (*)    All other components within normal limits  EXPECTORATED SPUTUM  ASSESSMENT W GRAM STAIN, RFLX TO RESP C  CBC  MAGNESIUM  PRO BRAIN NATRIURETIC PEPTIDE  LACTIC ACID, PLASMA  MULTIPLE MYELOMA PANEL, SERUM    EKG: EKG Interpretation Date/Time:  Thursday December 31 2024  10:46:47 EST Ventricular Rate:  64 PR Interval:    QRS Duration:  72 QT Interval:  388 QTC Calculation: 400 R Axis:   -66  Text Interpretation: Normal sinus rhythm No significant change since last tracing Confirmed by Dean Clarity [phone]) on 12/31/2024 3:34:18 PM  Radiology: DG Chest 1 View Result Date: 12/31/2024 EXAM: 1 VIEW XRAY OF THE CHEST 12/31/2024 11:20:00 AM COMPARISON: None available. CLINICAL HISTORY: Shortness of breath. FINDINGS: LUNGS AND PLEURA: No focal pulmonary opacity. No pleural effusion. No pneumothorax. HEART AND MEDIASTINUM: No acute abnormality of the cardiac and mediastinal silhouettes. BONES AND SOFT TISSUES: Surgical clips in left breast. No acute osseous abnormality. IMPRESSION: 1. No acute cardiopulmonary process. 2. Postsurgical changes of the left breast with surgical clips. Electronically signed by: Ryan Salvage MD 12/31/2024 12:08 PM EST RP Workstation: [id number]     Procedures   Medications Ordered in the ED  sodium chloride  flush (NS) 0.9 % injection 3 mL (3 mLs Intravenous Given 01/01/25 0857)  0.9 %  sodium chloride  infusion ( Intravenous Stopped 12/31/24 2124)  acetaminophen  (TYLENOL ) tablet 650 mg (has no administration in time range)    Or  acetaminophen  (TYLENOL ) suppository 650 mg (has no administration in time range)  oxyCODONE  (Oxy IR/ROXICODONE ) immediate release tablet 5 mg (has no administration in time range)  traZODone  (DESYREL ) tablet 25 mg (25 mg Oral Given 12/31/24 2259)  senna-docusate (Senokot-S) tablet 1 tablet (has no administration in time range)  ondansetron  (ZOFRAN ) tablet 4 mg (has no administration in time range)    Or  ondansetron  (ZOFRAN ) injection 4 mg (has no administration in time range)  ipratropium  (ATROVENT ) nebulizer solution 0.5 mg (has no administration in time range)  ferrous sulfate  tablet 325 mg (325 mg Oral Given 01/01/25 0857)  gabapentin  (NEURONTIN ) capsule 100 mg (100 mg Oral Given 01/01/25 0857)  metoprolol  succinate (TOPROL -XL) 24 hr tablet 12.5 mg (12.5 mg Oral Not Given 01/01/25 0856)  rivaroxaban  (XARELTO ) tablet 20 mg (20 mg Oral Given 12/31/24 1802)  rosuvastatin  (CRESTOR ) tablet 20 mg (20 mg Oral Given 01/01/25 0857)  lactobacillus (FLORANEX/LACTINEX) granules 1 g (1 g Oral Given 01/01/25 0856)  megestrol  (MEGACE ) 400 MG/10ML suspension 400 mg (400 mg Oral Given 01/01/25 0857)  melatonin tablet 5 mg (5 mg Oral Given 12/31/24 2046)  ALPRAZolam  (XANAX ) tablet 0.25 mg (has no administration in time range)  sodium chloride  0.9 % bolus 1,000 mL (1,000 mLs Intravenous New Bag/Given 12/31/24 1518)  sodium chloride  0.9 % bolus 1,000 mL (1,000 mLs Intravenous New Bag/Given 12/31/24 1801)                                    Medical Decision Making Amount and/or Complexity of Data Reviewed Labs: ordered.  Risk Decision regarding hospitalization.   This patient presents to the ED for concern of weakness, this involves an extensive number of treatment options, and is a complaint that carries with it a high risk of complications and morbidity.  The differential diagnosis includes infection, electrolyte abn, anemia   Co morbidities that complicate the patient evaluation  breast cancer, afib (on Xarelto ), HTN, GERD, CAD, obesity s/p bariatric surgery   Additional history obtained:  Additional history obtained from epic chart review External records from outside source obtained and reviewed including son   Lab Tests:  I Ordered, and personally interpreted labs.  The pertinent results include:  cbc nl; cmp with k elevated at 5.3, bun  elevated at 26 and cr 2.64 (Cr 1.18 on 1/14)   Imaging Studies ordered:  I ordered imaging studies including cxr  I independently visualized  and interpreted imaging which showed  No acute cardiopulmonary process.  2. Postsurgical changes of the left breast with surgical clips.   I agree with the radiologist interpretation   Cardiac Monitoring:  The patient was maintained on a cardiac monitor.  I personally viewed and interpreted the cardiac monitored which showed an underlying rhythm of: nsr   Medicines ordered and prescription drug management:  I ordered medication including ivfs  for sx  Reevaluation of the patient after these medicines showed that the patient improved I have reviewed the patients home medicines and have made adjustments as needed   Test Considered:  ct   Critical Interventions:  ivfs   Consultations Obtained:  I requested consultation with the hospitalist (Dr. Willette),  and discussed lab and imaging findings as well as pertinent plan - he will admit   Problem List / ED Course:  Hypercalcemia/aki/hyperkalemia:  likely due to poor oral intake, but she also recently finished bactrim  which can contribute to aki.  Pt given ivfs.  MM panel also drawn.  She has a hx of breast cancer, but no bony mets on CXR and no specific pain to indicate bony met.  Pt will need adm.  Ua pending upon admission as she's been dehydrated, but it's ordered and will be sent upon obtaining.   Reevaluation:  After the interventions noted above, I reevaluated the patient and found that they have :improved   Social Determinants of Health:  Lives at home   Dispostion:  After consideration of the diagnostic results and the patients response to treatment, I feel that the patent would benefit from admission.       Final diagnoses:  Dehydration  Hypercalcemia  AKI (acute kidney injury)  Hyperkalemia    ED Discharge Orders     None          Dean Clarity, MD 01/01/25 9042  "

## 2025-01-01 DIAGNOSIS — F03A0 Unspecified dementia, mild, without behavioral disturbance, psychotic disturbance, mood disturbance, and anxiety: Secondary | ICD-10-CM

## 2025-01-01 DIAGNOSIS — I10 Essential (primary) hypertension: Secondary | ICD-10-CM | POA: Diagnosis not present

## 2025-01-01 DIAGNOSIS — I2510 Atherosclerotic heart disease of native coronary artery without angina pectoris: Secondary | ICD-10-CM

## 2025-01-01 DIAGNOSIS — I480 Paroxysmal atrial fibrillation: Secondary | ICD-10-CM | POA: Diagnosis not present

## 2025-01-01 DIAGNOSIS — E860 Dehydration: Secondary | ICD-10-CM | POA: Diagnosis not present

## 2025-01-01 DIAGNOSIS — I959 Hypotension, unspecified: Secondary | ICD-10-CM | POA: Diagnosis not present

## 2025-01-01 DIAGNOSIS — N179 Acute kidney failure, unspecified: Secondary | ICD-10-CM | POA: Diagnosis not present

## 2025-01-01 LAB — BASIC METABOLIC PANEL WITH GFR
Anion gap: 8 (ref 5–15)
BUN: 24 mg/dL — ABNORMAL HIGH (ref 8–23)
CO2: 18 mmol/L — ABNORMAL LOW (ref 22–32)
Calcium: 11.3 mg/dL — ABNORMAL HIGH (ref 8.9–10.3)
Chloride: 112 mmol/L — ABNORMAL HIGH (ref 98–111)
Creatinine, Ser: 2.12 mg/dL — ABNORMAL HIGH (ref 0.44–1.00)
GFR, Estimated: 24 mL/min — ABNORMAL LOW
Glucose, Bld: 86 mg/dL (ref 70–99)
Potassium: 4.6 mmol/L (ref 3.5–5.1)
Sodium: 138 mmol/L (ref 135–145)

## 2025-01-01 LAB — CORTISOL-AM, BLOOD: Cortisol - AM: 3.2 ug/dL — ABNORMAL LOW (ref 6.7–22.6)

## 2025-01-01 LAB — CBC
HCT: 31.2 % — ABNORMAL LOW (ref 36.0–46.0)
Hemoglobin: 10.6 g/dL — ABNORMAL LOW (ref 12.0–15.0)
MCH: 33.8 pg (ref 26.0–34.0)
MCHC: 34 g/dL (ref 30.0–36.0)
MCV: 99.4 fL (ref 80.0–100.0)
Platelets: 130 10*3/uL — ABNORMAL LOW (ref 150–400)
RBC: 3.14 MIL/uL — ABNORMAL LOW (ref 3.87–5.11)
RDW: 13.2 % (ref 11.5–15.5)
WBC: 5.3 10*3/uL (ref 4.0–10.5)
nRBC: 0 % (ref 0.0–0.2)

## 2025-01-01 LAB — URINALYSIS, ROUTINE W REFLEX MICROSCOPIC
Bilirubin Urine: NEGATIVE
Glucose, UA: NEGATIVE mg/dL
Hgb urine dipstick: NEGATIVE
Ketones, ur: NEGATIVE mg/dL
Leukocytes,Ua: NEGATIVE
Nitrite: POSITIVE — AB
Protein, ur: NEGATIVE mg/dL
Specific Gravity, Urine: 1.006 (ref 1.005–1.030)
pH: 6 (ref 5.0–8.0)

## 2025-01-01 LAB — GLUCOSE, CAPILLARY: Glucose-Capillary: 69 mg/dL — ABNORMAL LOW (ref 70–99)

## 2025-01-01 MED ORDER — MIDODRINE HCL 5 MG PO TABS
5.0000 mg | ORAL_TABLET | Freq: Two times a day (BID) | ORAL | Status: AC
  Administered 2025-01-01 – 2025-01-02 (×2): 5 mg via ORAL
  Filled 2025-01-01 (×2): qty 1

## 2025-01-01 MED ORDER — POLYETHYLENE GLYCOL 3350 17 G PO PACK
17.0000 g | PACK | Freq: Two times a day (BID) | ORAL | Status: DC
  Administered 2025-01-01 – 2025-01-04 (×6): 17 g via ORAL
  Filled 2025-01-01 (×6): qty 1

## 2025-01-01 MED ORDER — SODIUM PHOSPHATES 45 MMOLE/15ML IV SOLN
15.0000 mmol | Freq: Once | INTRAVENOUS | Status: AC
  Administered 2025-01-01: 15 mmol via INTRAVENOUS
  Filled 2025-01-01: qty 5

## 2025-01-01 MED ORDER — RIVAROXABAN 15 MG PO TABS
15.0000 mg | ORAL_TABLET | Freq: Every day | ORAL | Status: DC
  Administered 2025-01-01 – 2025-01-03 (×3): 15 mg via ORAL
  Filled 2025-01-01 (×4): qty 1

## 2025-01-01 MED ORDER — SODIUM CHLORIDE 0.9 % IV SOLN: INTRAVENOUS | Status: AC

## 2025-01-01 MED ORDER — SENNOSIDES-DOCUSATE SODIUM 8.6-50 MG PO TABS
2.0000 | ORAL_TABLET | Freq: Every day | ORAL | Status: DC
  Administered 2025-01-01 – 2025-01-03 (×3): 2 via ORAL
  Filled 2025-01-01 (×3): qty 2

## 2025-01-01 NOTE — Care Management Obs Status (Signed)
 MEDICARE OBSERVATION STATUS NOTIFICATION   Patient Details  Name: Christina Hardin MRN: [id number] Date of Birth: [birth year]   Medicare Observation Status Notification Given:  Yes   Obs notice sign and copy provided.  Dennys Traughber 01/01/2025, 12:32 PM

## 2025-01-01 NOTE — Plan of Care (Signed)

## 2025-01-01 NOTE — Progress Notes (Signed)
 [facility]  PROGRESS NOTE  Christina Hardin FMW:[id number] DOB: [birth year] DOA: 12/31/2024 PCP: Gayle Saddie FALCON, PA-C   Brief HPI:    74 y.o Female with history of HTN, GERD, CAD, iron  deficiency anemia, breast cancer, Afib (on Xarelto ), obesity s/p bariatric surgery.  Recent UTI with Klebsiella, was treated with Bactrim .  Presenting with poor appetite, progressive generalized weaknesses difficulty with ambulation, significantly reduced oral intake UA: Pending Chest x-ray:No acute cardiopulmonary process.  Postsurgical changes on left breast    Patient Denies having: Fever, Chills, Cough, SOB, Chest Pain, Abd pain, N/V/D, headache, dizziness, lightheadedness,  Dysuria, Joint pain, rash, open wounds     Assessment/Plan:   AKI (acute kidney injury) -likely due to dehydration, poor p.o. intake -Continue with IV fluids -BUN/creatinine is improving -Monitor BUN/creatinine closely -Avoiding nephrotoxins, hypotension     Hypotensive  - Unclear etiology -Will hold metoprolol  -Patient was also on losartan  and amlodipine  which are currently on hold -Will give 1 dose of midodrine  -Check cortisol level in a.m.  Hypercalcemia Acute on chronic hypercalcemia -Improved to 11.3 with IV fluids -Serum calcium  at 13.5 (for past 4 years serum calcium  has been running 10-11) - Previous history of breast cancer -Thyroid  ultrasound from 2018 showed possible parathyroid mass -Check intact PTH, PTH RP -Vitamin D34.4  Hypophosphatemia - Replace phosphorus and check phosphorus level in a.m.   Mild dementia without behavioral disturbance, psychotic disturbance, mood disturbance, or anxiety (HCC) -Likely in setting of chronic hypercalcemia Stable, mood stable -Will monitor for delirium with underlying dementia -As needed Haldol, Xanax      Paroxysmal atrial fibrillation (HCC) -Will hold metoprolol  due to hypotension as above Rate controlled, -Continue Xarelto    UTI (urinary tract  infection) Recent history of UTI, cultures positive Klebsiella Per patient completed course of antibiotics of Bactrim  (5 days) Repeating UA was positive for nitrite -Patient is afebrile, asymptomatic, lactic acid 1.2 -Will not treat with antibiotics at this time   Coronary artery disease involving native coronary artery of native heart without angina pectoris Denies any chest pain - continue rosuvastatin  -Metoprolol  on hold as above   Anemia, iron  deficiency History of anemia of chronic disease, iron  deficiency anemia -Hemoglobin 10.6 today -Continue iron  supplements -Monitoring   Anxiety and depression - Stable mood,  - Not on any medication with exception of Neurontin    Essential hypertension Hypotensive, holding BP meds as above   FTT (failure to thrive) in adult -Continue to encourage oral intake, and oral hydration -Initiating appetite failure Megace       DVT prophylaxis: Xarelto   Medications     ferrous sulfate   325 mg Oral Daily   gabapentin   100 mg Oral TID   lactobacillus  1 g Oral TID WC   megestrol   400 mg Oral Daily   melatonin  5 mg Oral QHS   metoprolol  succinate  12.5 mg Oral Daily   polyethylene glycol  17 g Oral BID   rivaroxaban   15 mg Oral Q supper   rosuvastatin   20 mg Oral Daily   senna-docusate  2 tablet Oral QHS   sodium chloride  flush  3 mL Intravenous Q12H     Data Reviewed:   CBG:  Recent Labs  Lab 01/01/25 0814  GLUCAP 69*    SpO2: 99 %    Vitals:   01/01/25 0500 01/01/25 0547 01/01/25 0813 01/01/25 0856  BP:  100/68 107/65 107/65  Pulse:  (!) 51 (!) 51 (!) 51  Resp:   17   Temp:  97.6 F (  36.4 C) 97.9 F (36.6 C)   TempSrc:      SpO2:  97% 99%   Weight: 76.7 kg     Height:          Data Reviewed:  Basic Metabolic Panel: Recent Labs  Lab 12/31/24 1056 12/31/24 1058 01/01/25 0415  NA  --  137 138  K  --  5.3* 4.6  CL  --  105 112*  CO2  --  19* 18*  GLUCOSE  --  92 86  BUN  --  26* 24*  CREATININE   --  2.64* 2.12*  CALCIUM   --  13.5* 11.3*  MG 2.4  --   --   PHOS 2.0*  --   --     CBC: Recent Labs  Lab 12/31/24 1058 01/01/25 0415  WBC 7.1 5.3  HGB 13.7 10.6*  HCT 39.7 31.2*  MCV 98.0 99.4  PLT 168 130*    LFT Recent Labs  Lab 12/31/24 1058  AST 33  ALT 23  ALKPHOS 99  BILITOT 0.8  PROT 7.5  ALBUMIN 4.3     Antibiotics: Anti-infectives (From admission, onward)    None        CONSULTS   Code Status: Full code  Family Communication: Discussed with patient's sister on phone     Subjective   Continues to complains of fatigue.  Serum calcium  has improved with IV hydration.   Objective    Physical Examination:   General-appears in no acute distress Heart-S1-S2, regular, no murmur auscultated Lungs-clear to auscultation bilaterally, no wheezing or crackles auscultated Abdomen-soft, nontender, no organomegaly Extremities-no edema in the lower extremities Neuro-alert, oriented x3, no focal deficit noted         Christina Hardin   [facility] If 7PM-7AM, please contact night-coverage at [email or web address], Office  [phone]   01/01/2025, 11:04 AM  LOS: 0 days

## 2025-01-01 NOTE — Evaluation (Signed)
 Occupational Therapy Evaluation Patient Details Name: Christina Hardin MRN: [id number] DOB: 05/23/1951 Today's Date: 01/01/2025   History of Present Illness   Pt is a 74 y.o. female admitted 1/29 with AKI. Pt recently diagnosed with UTI. PMH:  HTN, GERD, CAD, iron  deficiency anemia, breast cancer, Afib (on Xarelto ), obesity s/p bariatric surgery     Clinical Impressions Shaden was evaluated s/p the above admission list. She is mod I with SPC at baseline. Upon evaluation the pt was limited by weakness and decreased activity tolerance. Overall she needs min A for basic mobility with RW for safety. Due to the deficits listed below the pt also needs up to min A for ADLs. Pt will benefit from continued acute OT services and no follow up OT as pt is likely near her baseline and has great family support.       If plan is discharge home, recommend the following:   A little help with walking and/or transfers;A little help with bathing/dressing/bathroom;Assistance with cooking/housework;Assist for transportation;Help with stairs or ramp for entrance     Functional Status Assessment   Patient has had a recent decline in their functional status and demonstrates the ability to make significant improvements in function in a reasonable and predictable amount of time.     Equipment Recommendations   None recommended by OT      Precautions/Restrictions   Precautions Precautions: Fall Recall of Precautions/Restrictions: Impaired Restrictions Weight Bearing Restrictions Per Provider Order: No     Mobility Bed Mobility Overal bed mobility: Needs Assistance Bed Mobility: Supine to Sit, Sit to Supine     Supine to sit: HOB elevated, Used rails, Contact guard Sit to supine: Contact guard assist        Transfers                          Balance Overall balance assessment: Needs assistance Sitting-balance support: No upper extremity supported, Feet supported Sitting  balance-Leahy Scale: Good     Standing balance support: Bilateral upper extremity supported, During functional activity, Reliant on assistive device for balance Standing balance-Leahy Scale: Poor                             ADL either performed or assessed with clinical judgement   ADL Overall ADL's : Needs assistance/impaired Eating/Feeding: Independent   Grooming: Set up;Sitting   Upper Body Bathing: Set up;Sitting   Lower Body Bathing: Contact guard assist;Sit to/from stand   Upper Body Dressing : Set up;Standing   Lower Body Dressing: Minimal assistance;Sit to/from stand   Toilet Transfer: Minimal assistance;Ambulation;Rolling walker (2 wheels)   Toileting- Clothing Manipulation and Hygiene: Supervision/safety;Sitting/lateral lean       Functional mobility during ADLs: Minimal assistance;Rolling walker (2 wheels) General ADL Comments: limited by weakness and decreased activity tolerance     Vision Baseline Vision/History: 1 Wears glasses Ability to See in Adequate Light: 0 Adequate Vision Assessment?: No apparent visual deficits;Wears glasses for reading     Perception Perception: Within Functional Limits       Praxis Praxis: [facility]       Pertinent Vitals/Pain Pain Assessment Pain Assessment: No/denies pain     Extremity/Trunk Assessment Upper Extremity Assessment Upper Extremity Assessment: Generalized weakness   Lower Extremity Assessment Lower Extremity Assessment: Generalized weakness   Cervical / Trunk Assessment Cervical / Trunk Assessment: Normal   Communication Communication Communication: No apparent difficulties   Cognition  Arousal: Alert Behavior During Therapy: WFL for tasks assessed/performed Cognition: No family/caregiver present to determine baseline             OT - Cognition Comments: Overall WFL for basic tasks would benefit from higher level cog assessment                 Following commands:  Impaired Following commands impaired: Follows multi-step commands inconsistently, Follows multi-step commands with increased time     Cueing  General Comments   Cueing Techniques: Verbal cues  VSS on RA    Home Living Family/patient expects to be discharged to:: Private residence Living Arrangements: Children Available Help at Discharge: Family;Available 24 hours/day Type of Home: House Home Access: Level entry     Home Layout: One level     Bathroom Shower/Tub: Producer, Television/film/video: Standard     Home Equipment: Agricultural Consultant (2 wheels);Cane - single point;Shower seat          Prior Functioning/Environment Prior Level of Function : Independent/Modified Independent             Mobility Comments: cane at baseline ADLs Comments: mod I ADls    OT Problem List: Decreased strength;Impaired balance (sitting and/or standing);Decreased activity tolerance   OT Treatment/Interventions: Self-care/ADL training;Neuromuscular education;DME and/or AE instruction;Therapeutic activities;Patient/family education;Balance training      OT Goals(Current goals can be found in the care plan section)   Acute Rehab OT Goals Patient Stated Goal: to get stronger OT Goal Formulation: With patient Potential to Achieve Goals: Good ADL Goals Pt Will Perform Grooming: with modified independence;standing Pt Will Perform Lower Body Dressing: with modified independence;sit to/from stand Pt Will Transfer to Toilet: with modified independence;bedside commode Additional ADL Goal #1: pt will tolerate at least 8 minutes of OOB activity to demonstrate improved endurance   OT Frequency:  Min 2X/week       AM-PAC OT 6 Clicks Daily Activity     Outcome Measure Help from another person eating meals?: None Help from another person taking care of personal grooming?: A Little Help from another person toileting, which includes using toliet, bedpan, or urinal?: A Little Help from  another person bathing (including washing, rinsing, drying)?: A Little Help from another person to put on and taking off regular upper body clothing?: A Little Help from another person to put on and taking off regular lower body clothing?: A Little 6 Click Score: 19   End of Session Nurse Communication: Mobility status  Activity Tolerance: Patient tolerated treatment well Patient left: in bed;with call bell/phone within reach;with bed alarm set  OT Visit Diagnosis: Unsteadiness on feet (R26.81);Muscle weakness (generalized) (M62.81)                Time: 1250-1306 OT Time Calculation (min): 16 min Charges:  OT General Charges $OT Visit: 1 Visit OT Evaluation $OT Eval Low Complexity: 1 Low  Lucie Kendall, OTR/L Acute Rehabilitation Services Office [phone] Secure Chat Communication Preferred  Lucie JONETTA Kendall 01/01/2025, 1:38 PM

## 2025-01-01 NOTE — Progress Notes (Signed)
 Transition of Care [facility]) - Inpatient Brief Assessment   Patient Details  Name: Christina Hardin MRN: [id number] Date of Birth: Jan 20, 1951  Transition of Care [facility] CM/SW Contact:    Rosaline JONELLE Joe, RN Phone Number: 01/01/2025, 11:19 AM   Clinical Narrative: CM met with the patient at the bedside to discuss IP Care management needs.  The patient lives at home with her two sons at the home.  Patient states that she admitted to the hospital with poor appetite and weakness and plans to return home when stable.  Patient states that she has not had a BM in weeks and took an Ex-Lax and was unable to make it to the bathroom.  Patient states that she had bariatric surgery years ago.  DME in the home includes RW, [place] and old shower chair.  Patient states that she drives to appointments.  CM will continue to follow for needs as patient progresses.   Transition of Care Asessment: Insurance and Status: (P) Insurance coverage has been reviewed Patient has primary care physician: (P) Yes Home environment has been reviewed: (P) from home with sons Prior level of function:: (P) Independent Prior/Current Home Services: (P) No current home services Social Drivers of Health Review: (P) SDOH reviewed needs interventions Readmission risk has been reviewed: (P) Yes Transition of care needs: (P) no transition of care needs at this time

## 2025-01-01 NOTE — Evaluation (Signed)
 Physical Therapy Evaluation Patient Details Name: Christina Hardin MRN: [id number] DOB: Nov 20, 1951 Today's Date: 01/01/2025  History of Present Illness  Pt is a 74 y.o. female admitted 1/29 with AKI. Pt recently diagnosed with UTI. PMH:  HTN, GERD, CAD, iron  deficiency anemia, breast cancer, Afib (on Xarelto ), obesity s/p bariatric surgery  Clinical Impression  Pt admitted with above diagnosis. PTA pt lived at home with her 2 adult sons, I/mod I mobility and ADLs with SPC. She reports no recent falls. Pt currently with functional limitations due to the deficits listed below (see PT Problem List). On eval, pt required min assist bed mobility, CGA transfers, and CGA amb 180' with RW. Deficits noted in strength and balance. Pt will benefit from acute skilled PT to increase their independence and safety with mobility to allow discharge. Post acute, pt would benefit from HHPT.          If plan is discharge home, recommend the following: A little help with walking and/or transfers;A little help with bathing/dressing/bathroom;Assistance with cooking/housework;Supervision due to cognitive status;Assist for transportation   Can travel by private vehicle        Equipment Recommendations None recommended by PT  Recommendations for Other Services       Functional Status Assessment Patient has had a recent decline in their functional status and demonstrates the ability to make significant improvements in function in a reasonable and predictable amount of time.     Precautions / Restrictions Precautions Precautions: Fall Recall of Precautions/Restrictions: Impaired      Mobility  Bed Mobility Overal bed mobility: Needs Assistance Bed Mobility: Supine to Sit, Sit to Supine     Supine to sit: Min assist, HOB elevated, Used rails Sit to supine: Contact guard assist   General bed mobility comments: assist to elevate trunk    Transfers Overall transfer level: Needs assistance Equipment used:  Rolling walker (2 wheels) Transfers: Sit to/from Stand Sit to Stand: Contact guard assist           General transfer comment: increased time    Ambulation/Gait Ambulation/Gait assistance: Contact guard assist Gait Distance (Feet): 180 Feet Assistive device: Rolling walker (2 wheels) Gait Pattern/deviations: Step-through pattern, Decreased stride length Gait velocity: decreased Gait velocity interpretation: 1.31 - 2.62 ft/sec, indicative of limited community ambulator   General Gait Details: steady gait with RW  Stairs            Wheelchair Mobility     Tilt Bed    Modified Rankin (Stroke Patients Only)       Balance Overall balance assessment: Needs assistance Sitting-balance support: No upper extremity supported, Feet supported Sitting balance-Leahy Scale: Good     Standing balance support: Bilateral upper extremity supported, During functional activity, Reliant on assistive device for balance Standing balance-Leahy Scale: Poor                               Pertinent Vitals/Pain Pain Assessment Pain Assessment: No/denies pain    Home Living Family/patient expects to be discharged to:: Private residence Living Arrangements: Children Available Help at Discharge: Family;Available 24 hours/day Type of Home: House Home Access: Level entry       Home Layout: One level Home Equipment: Agricultural Consultant (2 wheels);Cane - single point;Shower seat      Prior Function Prior Level of Function : Independent/Modified Independent             Mobility Comments: cane at baseline  Extremity/Trunk Assessment   Upper Extremity Assessment Upper Extremity Assessment: Generalized weakness    Lower Extremity Assessment Lower Extremity Assessment: Generalized weakness    Cervical / Trunk Assessment Cervical / Trunk Assessment: Normal  Communication   Communication Communication: No apparent difficulties    Cognition Arousal:  Alert Behavior During Therapy: WFL for tasks assessed/performed   PT - Cognitive impairments: No family/caregiver present to determine baseline, History of cognitive impairments, Orientation, Memory, Sequencing, Problem solving, Safety/Judgement   Orientation impairments: Time                   PT - Cognition Comments: dementia at baseline Following commands: Impaired Following commands impaired: Only follows one step commands consistently     Cueing Cueing Techniques: Verbal cues, Tactile cues     General Comments General comments (skin integrity, edema, etc.): VSS on RA    Exercises     Assessment/Plan    PT Assessment Patient needs continued PT services  PT Problem List Decreased strength;Decreased mobility;Decreased safety awareness;Decreased activity tolerance;Decreased balance       PT Treatment Interventions Gait training;Balance training;DME instruction;Functional mobility training;Therapeutic activities;Patient/family education    PT Goals (Current goals can be found in the Care Plan section)  Acute Rehab PT Goals Patient Stated Goal: home PT Goal Formulation: With patient Time For Goal Achievement: 01/15/25 Potential to Achieve Goals: Good    Frequency Min 2X/week     Co-evaluation               AM-PAC PT 6 Clicks Mobility  Outcome Measure Help needed turning from your back to your side while in a flat bed without using bedrails?: A Little Help needed moving from lying on your back to sitting on the side of a flat bed without using bedrails?: A Little Help needed moving to and from a bed to a chair (including a wheelchair)?: A Little Help needed standing up from a chair using your arms (e.g., wheelchair or bedside chair)?: A Little Help needed to walk in hospital room?: A Little Help needed climbing 3-5 steps with a railing? : A Lot 6 Click Score: 17    End of Session Equipment Utilized During Treatment: Gait belt Activity Tolerance:  Patient tolerated treatment well Patient left: in bed;with call bell/phone within reach;with bed alarm set Nurse Communication: Mobility status PT Visit Diagnosis: Muscle weakness (generalized) (M62.81)    Time: 8854-8795 PT Time Calculation (min) (ACUTE ONLY): 19 min   Charges:   PT Evaluation $PT Eval Moderate Complexity: 1 Mod   PT General Charges $$ ACUTE PT VISIT: 1 Visit         Sari MATSU., PT  Office # [phone]   Erven Sari Shaker 01/01/2025, 12:26 PM

## 2025-01-01 NOTE — Plan of Care (Signed)

## 2025-01-02 DIAGNOSIS — N179 Acute kidney failure, unspecified: Secondary | ICD-10-CM | POA: Diagnosis not present

## 2025-01-02 DIAGNOSIS — E860 Dehydration: Secondary | ICD-10-CM

## 2025-01-02 DIAGNOSIS — I959 Hypotension, unspecified: Secondary | ICD-10-CM | POA: Diagnosis not present

## 2025-01-02 LAB — COMPREHENSIVE METABOLIC PANEL WITH GFR
ALT: 13 U/L (ref 0–44)
AST: 19 U/L (ref 15–41)
Albumin: 3.2 g/dL — ABNORMAL LOW (ref 3.5–5.0)
Alkaline Phosphatase: 68 U/L (ref 38–126)
Anion gap: 8 (ref 5–15)
BUN: 19 mg/dL (ref 8–23)
CO2: 17 mmol/L — ABNORMAL LOW (ref 22–32)
Calcium: 11.1 mg/dL — ABNORMAL HIGH (ref 8.9–10.3)
Chloride: 117 mmol/L — ABNORMAL HIGH (ref 98–111)
Creatinine, Ser: 1.61 mg/dL — ABNORMAL HIGH (ref 0.44–1.00)
GFR, Estimated: 33 mL/min — ABNORMAL LOW
Glucose, Bld: 72 mg/dL (ref 70–99)
Potassium: 4.2 mmol/L (ref 3.5–5.1)
Sodium: 141 mmol/L (ref 135–145)
Total Bilirubin: 0.5 mg/dL (ref 0.0–1.2)
Total Protein: 5.5 g/dL — ABNORMAL LOW (ref 6.5–8.1)

## 2025-01-02 LAB — CBC
HCT: 30.9 % — ABNORMAL LOW (ref 36.0–46.0)
Hemoglobin: 10.3 g/dL — ABNORMAL LOW (ref 12.0–15.0)
MCH: 33.8 pg (ref 26.0–34.0)
MCHC: 33.3 g/dL (ref 30.0–36.0)
MCV: 101.3 fL — ABNORMAL HIGH (ref 80.0–100.0)
Platelets: 128 10*3/uL — ABNORMAL LOW (ref 150–400)
RBC: 3.05 MIL/uL — ABNORMAL LOW (ref 3.87–5.11)
RDW: 13.4 % (ref 11.5–15.5)
WBC: 5.5 10*3/uL (ref 4.0–10.5)
nRBC: 0 % (ref 0.0–0.2)

## 2025-01-02 LAB — MULTIPLE MYELOMA PANEL, SERUM
Albumin SerPl Elph-Mcnc: 3.3 g/dL (ref 2.9–4.4)
Albumin/Glob SerPl: 1.1 (ref 0.7–1.7)
Alpha 1: 0.3 g/dL (ref 0.0–0.4)
Alpha2 Glob SerPl Elph-Mcnc: 1 g/dL (ref 0.4–1.0)
B-Globulin SerPl Elph-Mcnc: 0.9 g/dL (ref 0.7–1.3)
Gamma Glob SerPl Elph-Mcnc: 0.9 g/dL (ref 0.4–1.8)
Globulin, Total: 3.1 g/dL (ref 2.2–3.9)
IgA: 318 mg/dL (ref 64–422)
IgG (Immunoglobin G), Serum: 948 mg/dL (ref 586–1602)
IgM (Immunoglobulin M), Srm: 75 mg/dL (ref 26–217)
Total Protein ELP: 6.4 g/dL (ref 6.0–8.5)

## 2025-01-02 LAB — PARATHYROID HORMONE, INTACT (NO CA): PTH: 53 pg/mL (ref 15–65)

## 2025-01-02 LAB — PHOSPHORUS: Phosphorus: 2.1 mg/dL — ABNORMAL LOW (ref 2.5–4.6)

## 2025-01-02 MED ORDER — SODIUM PHOSPHATES 45 MMOLE/15ML IV SOLN
15.0000 mmol | Freq: Once | INTRAVENOUS | Status: AC
  Administered 2025-01-02: 15 mmol via INTRAVENOUS
  Filled 2025-01-02: qty 5

## 2025-01-02 MED ORDER — COSYNTROPIN 0.25 MG IJ SOLR
0.2500 mg | Freq: Once | INTRAMUSCULAR | Status: AC
  Administered 2025-01-03: 0.25 mg via INTRAVENOUS
  Filled 2025-01-02: qty 0.25

## 2025-01-02 NOTE — Progress Notes (Addendum)
 [facility]  PROGRESS NOTE  Christina Hardin FMW:[id number] DOB: 1951/04/01 DOA: 12/31/2024 PCP: Gayle Saddie FALCON, PA-C   Brief HPI:    74 y.o Female with history of HTN, GERD, CAD, iron  deficiency anemia, breast cancer, Afib (on Xarelto ), obesity s/p bariatric surgery.  Recent UTI with Klebsiella, was treated with Bactrim .  Presenting with poor appetite, progressive generalized weaknesses difficulty with ambulation, significantly reduced oral intake UA: Pending Chest x-ray:No acute cardiopulmonary process.  Postsurgical changes on left breast    Patient Denies having: Fever, Chills, Cough, SOB, Chest Pain, Abd pain, N/V/D, headache, dizziness, lightheadedness,  Dysuria, Joint pain, rash, open wounds     Assessment/Plan:   AKI (acute kidney injury) -likely due to dehydration, poor p.o. intake -Baseline creatinine around 1.07-1.18 -Creatinine 2.64> 2.1> 1.61 -Avoiding nephrotoxins, hypotension     Hypotension/?  Adrenal insufficiency -Metoprolol  on hold -Serum cortisol this morning was low at 3.2 -Will order cosyntropin  stimulation test -Patient was also on losartan  and amlodipine  which are currently on hold   Hypercalcemia Acute on chronic hypercalcemia -Improved to 11.1 with IV fluids -Serum calcium  at 13.5 (for past 4 years serum calcium  has been running 10-11) - Previous history of breast cancer -Thyroid  ultrasound from 2018 showed possible parathyroid mass -Check intact PTH, PTH RP -Vitamin D34.4  Hypophosphatemia -Phosphorus is still low at 2.1 despite replacement on 01/01/2025 - Will give additional dose of IV sodium phosphate   and check phosphorus level in a.m.   Mild dementia without behavioral disturbance, psychotic disturbance, mood disturbance, or anxiety (HCC) -Likely in setting of chronic hypercalcemia Stable, mood stable -Will monitor for delirium with underlying dementia -As needed Haldol, Xanax      Paroxysmal atrial fibrillation (HCC) -Will hold  metoprolol  due to hypotension as above Rate controlled, -Continue Xarelto    UTI (urinary tract infection) Recent history of UTI, cultures positive Klebsiella Per patient completed course of antibiotics of Bactrim  (5 days) Repeating UA was positive for nitrite -Patient is afebrile, asymptomatic, lactic acid 1.2 -Will not treat with antibiotics at this time   Coronary artery disease involving native coronary artery of native heart without angina pectoris Denies any chest pain - continue rosuvastatin  -Metoprolol  on hold as above   Anemia, iron  deficiency History of anemia of chronic disease, iron  deficiency anemia -Hemoglobin 10.6 today -Continue iron  supplements -Monitoring   Anxiety and depression - Stable mood,  - Not on any medication with exception of Neurontin    Essential hypertension Hypotensive, holding BP meds as above   FTT (failure to thrive) in adult -Continue to encourage oral intake, and oral hydration -Initiating appetite failure Megace       DVT prophylaxis: Xarelto   Medications     ferrous sulfate   325 mg Oral Daily   gabapentin   100 mg Oral TID   lactobacillus  1 g Oral TID WC   megestrol   400 mg Oral Daily   melatonin  5 mg Oral QHS   polyethylene glycol  17 g Oral BID   rivaroxaban   15 mg Oral Q supper   rosuvastatin   20 mg Oral Daily   senna-docusate  2 tablet Oral QHS   sodium chloride  flush  3 mL Intravenous Q12H     Data Reviewed:   CBG:  Recent Labs  Lab 01/01/25 0814  GLUCAP 69*    SpO2: 99 %    Vitals:   01/01/25 2018 01/02/25 0424 01/02/25 0525 01/02/25 0835  BP: 100/71  114/79 (!) 104/58  Pulse: (!) 54  (!) 58 (!) 56  Resp:  16  Temp: 97.9 F (36.6 C)  97.6 F (36.4 C) 97.9 F (36.6 C)  TempSrc:    Oral  SpO2: 97%  99% 99%  Weight:  79.7 kg    Height:          Data Reviewed:  Basic Metabolic Panel: Recent Labs  Lab 12/31/24 1056 12/31/24 1058 01/01/25 0415 01/02/25 0713  NA  --  137 138 141  K  --   5.3* 4.6 4.2  CL  --  105 112* 117*  CO2  --  19* 18* 17*  GLUCOSE  --  92 86 72  BUN  --  26* 24* 19  CREATININE  --  2.64* 2.12* 1.61*  CALCIUM   --  13.5* 11.3* 11.1*  MG 2.4  --   --   --   PHOS 2.0*  --   --  2.1*    CBC: Recent Labs  Lab 12/31/24 1058 01/01/25 0415 01/02/25 0713  WBC 7.1 5.3 5.5  HGB 13.7 10.6* 10.3*  HCT 39.7 31.2* 30.9*  MCV 98.0 99.4 101.3*  PLT 168 130* 128*    LFT Recent Labs  Lab 12/31/24 1058 01/02/25 0713  AST 33 19  ALT 23 13  ALKPHOS 99 68  BILITOT 0.8 0.5  PROT 7.5 5.5*  ALBUMIN 4.3 3.2*     Antibiotics: Anti-infectives (From admission, onward)    None        CONSULTS   Code Status: Full code  Family Communication: Discussed with patient's sister on phone     Subjective   Still feels the same.  Denies any new complaints   Objective    Physical Examination:     General-appears in no acute distress Heart-S1-S2, regular, no murmur auscultated Lungs-clear to auscultation bilaterally, no wheezing or crackles auscultated Abdomen-soft, nontender, no organomegaly Extremities-no edema in the lower extremities Neuro-alert, oriented x3, no focal deficit noted       Corene Resnick S Graci Hulce   [facility] If 7PM-7AM, please contact night-coverage at [email or web address], Office  [phone]   01/02/2025, 1:15 PM  LOS: 1 day

## 2025-01-02 NOTE — Plan of Care (Signed)

## 2025-01-03 DIAGNOSIS — I959 Hypotension, unspecified: Secondary | ICD-10-CM | POA: Diagnosis not present

## 2025-01-03 DIAGNOSIS — N179 Acute kidney failure, unspecified: Secondary | ICD-10-CM | POA: Diagnosis not present

## 2025-01-03 DIAGNOSIS — E860 Dehydration: Secondary | ICD-10-CM | POA: Diagnosis not present

## 2025-01-03 LAB — COMPREHENSIVE METABOLIC PANEL WITH GFR
ALT: 24 U/L (ref 0–44)
AST: 37 U/L (ref 15–41)
Albumin: 3.5 g/dL (ref 3.5–5.0)
Alkaline Phosphatase: 73 U/L (ref 38–126)
Anion gap: 9 (ref 5–15)
BUN: 13 mg/dL (ref 8–23)
CO2: 17 mmol/L — ABNORMAL LOW (ref 22–32)
Calcium: 11.3 mg/dL — ABNORMAL HIGH (ref 8.9–10.3)
Chloride: 115 mmol/L — ABNORMAL HIGH (ref 98–111)
Creatinine, Ser: 1.47 mg/dL — ABNORMAL HIGH (ref 0.44–1.00)
GFR, Estimated: 37 mL/min — ABNORMAL LOW
Glucose, Bld: 106 mg/dL — ABNORMAL HIGH (ref 70–99)
Potassium: 4.3 mmol/L (ref 3.5–5.1)
Sodium: 141 mmol/L (ref 135–145)
Total Bilirubin: 0.6 mg/dL (ref 0.0–1.2)
Total Protein: 5.9 g/dL — ABNORMAL LOW (ref 6.5–8.1)

## 2025-01-03 LAB — GLUCOSE, CAPILLARY: Glucose-Capillary: 74 mg/dL (ref 70–99)

## 2025-01-03 LAB — ACTH STIMULATION, 3 TIME POINTS
Cortisol, 30 Min: 12.4 ug/dL
Cortisol, 60 Min: 15.1 ug/dL
Cortisol, Base: 4.1 ug/dL

## 2025-01-03 LAB — PHOSPHORUS: Phosphorus: 1.8 mg/dL — ABNORMAL LOW (ref 2.5–4.6)

## 2025-01-03 LAB — LITHIUM LEVEL: Lithium Lvl: <0.1 mmol/L — ABNORMAL LOW (ref 0.60–1.20)

## 2025-01-03 MED ORDER — LACTATED RINGERS IV SOLN: INTRAVENOUS | Status: AC

## 2025-01-03 NOTE — Plan of Care (Signed)
?  Problem: Education: ?Goal: Knowledge of General Education information will improve ?Description: Including pain rating scale, medication(s)/side effects and non-pharmacologic comfort measures ?Outcome: Progressing ?  ?Problem: Clinical Measurements: ?Goal: Ability to maintain clinical measurements within normal limits will improve ?Outcome: Progressing ?Goal: Will remain free from infection ?Outcome: Progressing ?Goal: Diagnostic test results will improve ?Outcome: Progressing ?Goal: Respiratory complications will improve ?Outcome: Progressing ?Goal: Cardiovascular complication will be avoided ?Outcome: Progressing ?  ?Problem: Nutrition: ?Goal: Adequate nutrition will be maintained ?Outcome: Progressing ?  ?Problem: Elimination: ?Goal: Will not experience complications related to bowel motility ?Outcome: Progressing ?Goal: Will not experience complications related to urinary retention ?Outcome: Progressing ?  ?Problem: Safety: ?Goal: Ability to remain free from injury will improve ?Outcome: Progressing ?  ?Problem: Skin Integrity: ?Goal: Risk for impaired skin integrity will decrease ?Outcome: Progressing ?  ?

## 2025-01-03 NOTE — Progress Notes (Addendum)
 [facility]  PROGRESS NOTE  Christina Hardin FMW:[id number] DOB: 09/01/1951 DOA: 12/31/2024 PCP: Gayle Saddie FALCON, PA-C   Brief HPI:    74 y.o Female with history of HTN, GERD, CAD, iron  deficiency anemia, breast cancer, Afib (on Xarelto ), obesity s/p bariatric surgery.  Recent UTI with Klebsiella, was treated with Bactrim .  Presenting with poor appetite, progressive generalized weaknesses difficulty with ambulation, significantly reduced oral intake UA: Pending Chest x-ray:No acute cardiopulmonary process.  Postsurgical changes on left breast    Patient Denies having: Fever, Chills, Cough, SOB, Chest Pain, Abd pain, N/V/D, headache, dizziness, lightheadedness,  Dysuria, Joint pain, rash, open wounds     Assessment/Plan:   AKI (acute kidney injury) -likely due to dehydration, poor p.o. intake -Baseline creatinine around 1.07-1.18 -Creatinine 2.64> 2.1> 1.61> 1.47 -Avoiding nephrotoxins, hypotension  Hyperchloremic metabolic acidosis - Start LR at 75 mL/h for 12 hours -Follow labs in a.m.     Hypotension/?  Adrenal insufficiency -Metoprolol , amlodipine  on hold -Serum cortisol was low at 3.2,  -cosyntropin  stimulation test; cortisol level at 30 minutes 12.4, cortisol low at 60 minutes 15.1 - Patient's blood pressure has improved, also has a partial response to cosyntropin  stimulation test - Will check ACTH level in a.m. - Will hold off on starting steroids    Hypercalcemia Acute on chronic hypercalcemia -Improved to 11.1 with IV fluids -Serum calcium  at 13.5 (for past 4 years serum calcium  has been running 10-11) - Previous history of breast cancer -Thyroid  ultrasound from 2018 showed possible parathyroid mass -Patient has been taking lithium which can cause hypercalcemia; check lithium levels -Check intact PTH 53, PTH RP is pending -Vitamin D  34.4  Hypophosphatemia -Phosphorus is still low at 2.1 despite replacement on 01/01/2025 - Will give additional dose of IV sodium  phosphate  and check phosphorus level in a.m.   Mild dementia without behavioral disturbance, psychotic disturbance, mood disturbance, or anxiety (HCC) -Likely in setting of chronic hypercalcemia Stable, mood stable -Will monitor for delirium with underlying dementia -As needed Haldol, Xanax      Paroxysmal atrial fibrillation (HCC) -Will hold metoprolol  due to hypotension as above Rate controlled, -Continue Xarelto    UTI (urinary tract infection) Recent history of UTI, cultures positive Klebsiella Per patient completed course of antibiotics of Bactrim  (5 days) Repeating UA was positive for nitrite -Patient is afebrile, asymptomatic, lactic acid 1.2 -Will not treat with antibiotics at this time   Coronary artery disease involving native coronary artery of native heart without angina pectoris Denies any chest pain - continue rosuvastatin  -Metoprolol  on hold as above   Anemia, iron  deficiency History of anemia of chronic disease, iron  deficiency anemia -Hemoglobin 10.6 today -Continue iron  supplements -Monitoring   Anxiety and depression - Stable mood,  - Not on any medication with exception of Neurontin    Essential hypertension Hypotensive, holding BP meds as above   FTT (failure to thrive) in adult -Continue to encourage oral intake, and oral hydration -Initiating appetite failure Megace       DVT prophylaxis: Xarelto   Medications     ferrous sulfate   325 mg Oral Daily   gabapentin   100 mg Oral TID   lactobacillus  1 g Oral TID WC   megestrol   400 mg Oral Daily   melatonin  5 mg Oral QHS   polyethylene glycol  17 g Oral BID   rivaroxaban   15 mg Oral Q supper   rosuvastatin   20 mg Oral Daily   senna-docusate  2 tablet Oral QHS   sodium chloride  flush  3 mL Intravenous Q12H     Data Reviewed:   CBG:  Recent Labs  Lab 01/01/25 0814 01/03/25 0804  GLUCAP 69* 74    SpO2: 97 %    Vitals:   01/02/25 0835 01/02/25 2126 01/03/25 0551 01/03/25 0844   BP: (!) 104/58 124/80 (!) 118/55 119/69  Pulse: (!) 56 63 (!) 57 (!) 57  Resp: 16  18 17   Temp: 97.9 F (36.6 C) 98.1 F (36.7 C) 98.3 F (36.8 C) 98.1 F (36.7 C)  TempSrc: Oral Oral    SpO2: 99%  98% 97%  Weight:      Height:          Data Reviewed:  Basic Metabolic Panel: Recent Labs  Lab 12/31/24 1056 12/31/24 1058 01/01/25 0415 01/02/25 0713  NA  --  137 138 141  K  --  5.3* 4.6 4.2  CL  --  105 112* 117*  CO2  --  19* 18* 17*  GLUCOSE  --  92 86 72  BUN  --  26* 24* 19  CREATININE  --  2.64* 2.12* 1.61*  CALCIUM   --  13.5* 11.3* 11.1*  MG 2.4  --   --   --   PHOS 2.0*  --   --  2.1*    CBC: Recent Labs  Lab 12/31/24 1058 01/01/25 0415 01/02/25 0713  WBC 7.1 5.3 5.5  HGB 13.7 10.6* 10.3*  HCT 39.7 31.2* 30.9*  MCV 98.0 99.4 101.3*  PLT 168 130* 128*    LFT Recent Labs  Lab 12/31/24 1058 01/02/25 0713  AST 33 19  ALT 23 13  ALKPHOS 99 68  BILITOT 0.8 0.5  PROT 7.5 5.5*  ALBUMIN 4.3 3.2*     Antibiotics: Anti-infectives (From admission, onward)    None        CONSULTS   Code Status: Full code  Family Communication: Discussed with patient's sister on phone     Subjective   Patient seen and examined, denies any complaints   Objective    Physical Examination:  General-appears in no acute distress Heart-S1-S2, regular, no murmur auscultated Lungs-clear to auscultation bilaterally, no wheezing or crackles auscultated Abdomen-soft, nontender, no organomegaly Extremities-no edema in the lower extremities Neuro-alert, oriented x3, no focal deficit noted        Janthony Holleman S Bertice Risse   [facility] If 7PM-7AM, please contact night-coverage at [email or web address], Office  [phone]   01/03/2025, 9:25 AM  LOS: 2 days

## 2025-01-04 ENCOUNTER — Other Ambulatory Visit (HOSPITAL_COMMUNITY): Payer: Self-pay

## 2025-01-04 ENCOUNTER — Other Ambulatory Visit: Payer: Self-pay

## 2025-01-04 ENCOUNTER — Telehealth: Payer: Self-pay | Admitting: *Deleted

## 2025-01-04 DIAGNOSIS — E860 Dehydration: Secondary | ICD-10-CM | POA: Diagnosis not present

## 2025-01-04 DIAGNOSIS — I959 Hypotension, unspecified: Secondary | ICD-10-CM | POA: Diagnosis not present

## 2025-01-04 DIAGNOSIS — N179 Acute kidney failure, unspecified: Secondary | ICD-10-CM | POA: Diagnosis not present

## 2025-01-04 DIAGNOSIS — E213 Hyperparathyroidism, unspecified: Secondary | ICD-10-CM

## 2025-01-04 LAB — COMPREHENSIVE METABOLIC PANEL WITH GFR
ALT: 52 U/L — ABNORMAL HIGH (ref 0–44)
AST: 68 U/L — ABNORMAL HIGH (ref 15–41)
Albumin: 3.7 g/dL (ref 3.5–5.0)
Alkaline Phosphatase: 77 U/L (ref 38–126)
Anion gap: 8 (ref 5–15)
BUN: 11 mg/dL (ref 8–23)
CO2: 18 mmol/L — ABNORMAL LOW (ref 22–32)
Calcium: 11.8 mg/dL — ABNORMAL HIGH (ref 8.9–10.3)
Chloride: 112 mmol/L — ABNORMAL HIGH (ref 98–111)
Creatinine, Ser: 1.42 mg/dL — ABNORMAL HIGH (ref 0.44–1.00)
GFR, Estimated: 39 mL/min — ABNORMAL LOW
Glucose, Bld: 77 mg/dL (ref 70–99)
Potassium: 4 mmol/L (ref 3.5–5.1)
Sodium: 138 mmol/L (ref 135–145)
Total Bilirubin: 0.7 mg/dL (ref 0.0–1.2)
Total Protein: 6.3 g/dL — ABNORMAL LOW (ref 6.5–8.1)

## 2025-01-04 LAB — GLUCOSE, CAPILLARY: Glucose-Capillary: 84 mg/dL (ref 70–99)

## 2025-01-04 MED ORDER — BISACODYL 10 MG RE SUPP
10.0000 mg | Freq: Once | RECTAL | Status: AC
  Administered 2025-01-04: 10 mg via RECTAL
  Filled 2025-01-04: qty 1

## 2025-01-04 MED ORDER — K PHOS MONO-SOD PHOS DI & MONO 155-852-130 MG PO TABS
250.0000 mg | ORAL_TABLET | Freq: Three times a day (TID) | ORAL | 0 refills | Status: AC
  Filled 2025-01-04: qty 9, 3d supply, fill #0

## 2025-01-04 MED ORDER — POLYETHYLENE GLYCOL 3350 17 GM/SCOOP PO POWD
17.0000 g | Freq: Every day | ORAL | 0 refills | Status: AC | PRN
  Filled 2025-01-04: qty 238, 14d supply, fill #0

## 2025-01-04 NOTE — Progress Notes (Signed)
 Physical Therapy Treatment Patient Details Name: Christina Hardin MRN: [id number] DOB: [birth year] Today's Date: 01/04/2025   History of Present Illness Pt is a 74 y.o. female admitted 1/29 with AKI. Pt recently diagnosed with UTI. PMH:  HTN, GERD, CAD, iron  deficiency anemia, breast cancer, Afib (on Xarelto ), obesity s/p bariatric surgery    PT Comments  Pt required min assist supine to sit (from flat bed), CGA sit to stand, and CGA amb 200' with RW. Good mobility progress noted. At baseline, pt amb without AD. Recommend use of RW (which she has) upon initial d/c home. Pt verbalizes understanding. Pt in recliner with feet elevated at end of session. Current POC remains appropriate.     If plan is discharge home, recommend the following: A little help with walking and/or transfers;A little help with bathing/dressing/bathroom;Assistance with cooking/housework;Supervision due to cognitive status;Assist for transportation   Can travel by private vehicle        Equipment Recommendations  None recommended by PT    Recommendations for Other Services       Precautions / Restrictions Precautions Precautions: Fall Recall of Precautions/Restrictions: Impaired     Mobility  Bed Mobility Overal bed mobility: Needs Assistance Bed Mobility: Supine to Sit     Supine to sit: Used rails, Min assist     General bed mobility comments: assist to elevate trunk from flat bed    Transfers Overall transfer level: Needs assistance Equipment used: Rolling walker (2 wheels) Transfers: Sit to/from Stand Sit to Stand: Contact guard assist           General transfer comment: cues for hand placement, increased time    Ambulation/Gait Ambulation/Gait assistance: Contact guard assist Gait Distance (Feet): 200 Feet Assistive device: Rolling walker (2 wheels) Gait Pattern/deviations: Step-through pattern, Decreased stride length Gait velocity: decreased Gait velocity interpretation: 1.31 - 2.62  ft/sec, indicative of limited community ambulator   General Gait Details: steady gait with RW   Stairs             Wheelchair Mobility     Tilt Bed    Modified Rankin (Stroke Patients Only)       Balance Overall balance assessment: Needs assistance Sitting-balance support: No upper extremity supported, Feet supported Sitting balance-Leahy Scale: Good     Standing balance support: During functional activity, No upper extremity supported, Bilateral upper extremity supported Standing balance-Leahy Scale: Fair Standing balance comment: static stand without UE support, RW for amb                            Communication Communication Communication: No apparent difficulties  Cognition Arousal: Alert Behavior During Therapy: WFL for tasks assessed/performed   PT - Cognitive impairments: No family/caregiver present to determine baseline, History of cognitive impairments, Problem solving, Safety/Judgement, Memory, Orientation   Orientation impairments: Time                   PT - Cognition Comments: dementia at baseline Following commands: Impaired Following commands impaired: Follows multi-step commands inconsistently, Follows multi-step commands with increased time    Cueing Cueing Techniques: Verbal cues  Exercises      General Comments General comments (skin integrity, edema, etc.): VSS on RA      Pertinent Vitals/Pain Pain Assessment Pain Assessment: Faces Faces Pain Scale: Hurts a little bit Pain Location: back Pain Descriptors / Indicators: Discomfort Pain Intervention(s): Repositioned, Monitored during session    Home Living  Prior Function            PT Goals (current goals can now be found in the care plan section) Acute Rehab PT Goals Patient Stated Goal: home Progress towards PT goals: Progressing toward goals    Frequency    Min 2X/week      PT Plan      Co-evaluation               AM-PAC PT 6 Clicks Mobility   Outcome Measure  Help needed turning from your back to your side while in a flat bed without using bedrails?: None Help needed moving from lying on your back to sitting on the side of a flat bed without using bedrails?: A Little Help needed moving to and from a bed to a chair (including a wheelchair)?: A Little Help needed standing up from a chair using your arms (e.g., wheelchair or bedside chair)?: A Little Help needed to walk in hospital room?: A Little Help needed climbing 3-5 steps with a railing? : A Lot 6 Click Score: 18    End of Session Equipment Utilized During Treatment: Gait belt Activity Tolerance: Patient tolerated treatment well Patient left: in chair;with call bell/phone within reach;with chair alarm set Nurse Communication: Mobility status PT Visit Diagnosis: Muscle weakness (generalized) (M62.81)     Time: 9144-9080 PT Time Calculation (min) (ACUTE ONLY): 24 min  Charges:    $Gait Training: 23-37 mins PT General Charges $$ ACUTE PT VISIT: 1 Visit                     Sari MATSU., PT  Office # [phone]    Erven Sari Shaker 01/04/2025, 11:18 AM

## 2025-01-04 NOTE — Plan of Care (Signed)

## 2025-01-04 NOTE — Telephone Encounter (Signed)
 Lvm for pt to call office to inform her of below and to reschedule her appt tomorrow and schedule lab visit a few days before. Please assist in getting this done if she calls back.        Christina Saddie FALCON, PA-C  [facility] Can we push patient's appointment out to next week. She just got discharged from hospital today so labs will need a few days before rechecking.

## 2025-01-04 NOTE — Discharge Summary (Signed)
 " Physician Discharge Summary   Patient: Christina Hardin MRN: [id number] DOB: [birth year]  Admit date:     12/31/2024  Discharge date: 01/04/25  Discharge Physician: Sabas GORMAN Brod   PCP: Christina Saddie FALCON, PA-C   Recommendations at discharge:   Follow-up PCP in 1 week Need endocrinology referral for hypercalcemia as outpatient; called and discussed with PCP kara Clapp Check LFTs in 1 week Hold Crestor  till LFTs are normal Check BMP in 1 week at PCP office Follow ACTH level as outpatient Follow-up PTH RP as outpatient Check phosphorus level as outpatient  Discharge Diagnoses: Principal Problem:   AKI (acute kidney injury) Active Problems:   Paroxysmal atrial fibrillation (HCC)   Mild dementia without behavioral disturbance, psychotic disturbance, mood disturbance, or anxiety (HCC)   Hypercalcemia   Hypotensive episode   Anxiety and depression   Anemia, iron  deficiency   Coronary artery disease involving native coronary artery of native heart without angina pectoris   UTI (urinary tract infection)   Essential hypertension   FTT (failure to thrive) in adult  Resolved Problems:   * No resolved hospital problems. *  Hospital Course:   74 y.o Female with history of HTN, GERD, CAD, iron  deficiency anemia, breast cancer, Afib (on Xarelto ), obesity s/p bariatric surgery.  Recent UTI with Klebsiella, was treated with Bactrim .  Presenting with poor appetite, progressive generalized weaknesses difficulty with ambulation, significantly reduced oral intake UA: Pending Chest x-ray:No acute cardiopulmonary process.  Postsurgical changes on left breast  Assessment and Plan:  AKI (acute kidney injury) -likely due to dehydration, poor p.o. intake -Baseline creatinine around 1.07-1.18 -Creatinine 2.64> 2.1> 1.61> 1.47> 1.42    Hyperchloremic metabolic acidosis - Improving -Check BMP in 1 week at PCP office     Hypotension/?  Adrenal insufficiency -Metoprolol , amlodipine  on were held in  the hospital -Serum cortisol was low at 3.2,  -cosyntropin  stimulation test; cortisol level at 30 minutes 12.4, cortisol low at 60 minutes 15.1 - Patient's blood pressure has improved, also has a partial response to cosyntropin  stimulation test - ACTH level obtained, result is currently pending - Will hold off on starting steroids       Hypercalcemia Acute on chronic hypercalcemia -Improved to 11.1 with IV fluids -Serum calcium  at 13.5 (for past 4 years serum calcium  has been running 10-11) - Previous history of breast cancer -Thyroid  ultrasound from 2018 showed possible parathyroid mass -Check intact PTH 53, PTH RP is pending -Vitamin D  34.4 -Follow-up PTH RP as outpatient   Hypophosphatemia -Phosphorus is still low at 2.1 despite replacement on 01/01/2025 - Will discharge on potassium phosphate  -Follow phosphorus level as outpatient     Mild dementia without behavioral disturbance, psychotic disturbance, mood disturbance, or anxiety (HCC) -Likely in setting of chronic hypercalcemia Stable, mood stable -Will monitor for delirium with underlying dementia      Paroxysmal atrial fibrillation (HCC) -Restart metoprolol  25 mg daily at home Rate controlled, -Continue Xarelto    UTI (urinary tract infection) Recent history of UTI, cultures positive Klebsiella Per patient completed course of antibiotics of Bactrim  (5 days) Repeating UA was positive for nitrite -Patient is afebrile, asymptomatic, lactic acid 1.2 -Will not treat with antibiotics at this time   Coronary artery disease involving native coronary artery of native heart without angina pectoris Denies any chest pain - Hold Crestor  at this time due to elevated LFTs  Transaminitis - Patient has mild elevation of LFTs - She has a history of fatty liver disease - Hold Crestor  until LFTs  are normal - Check LFTs as outpatient    Anemia, iron  deficiency History of anemia of chronic disease, iron  deficiency  anemia -Hemoglobin 10.6 today -Continue iron  supplements    Anxiety and depression - Stable mood,  - Not on any medication with exception of Neurontin    Essential hypertension -Discontinue amlodipine  and losartan    FTT (failure to thrive) in adult -Continue to encourage oral intake, and oral hydration  To go home with home health PT                Consultants:  Procedures performed:   Disposition: Home Diet recommendation:  Regular diet DISCHARGE MEDICATION: Allergies as of 01/04/2025       Reactions   Dilaudid  [hydromorphone  Hcl] Shortness Of Breath   Heme-iron -polypeptide [iron ] Other (See Comments)   Unknown reaction   Darvon-n [propoxyphene] Anxiety, Other (See Comments)   Hyperventilation         Medication List     STOP taking these medications    amLODipine  2.5 MG tablet Commonly known as: NORVASC    ibuprofen 200 MG tablet Commonly known as: ADVIL   IRON  PO   losartan  100 MG tablet Commonly known as: COZAAR    losartan  50 MG tablet Commonly known as: COZAAR    rosuvastatin  20 MG tablet Commonly known as: Crestor    spironolactone  25 MG tablet Commonly known as: ALDACTONE    sulfamethoxazole -trimethoprim  800-160 MG tablet Commonly known as: Bactrim  DS       TAKE these medications    pramipexole  0.125 MG tablet Commonly known as: MIRAPEX  Take 0.125 mg by mouth daily. The timing of this medication is very important.   EX-LAX PO Take 1 tablet by mouth daily as needed (constipation).   ferrous sulfate  325 (65 FE) MG tablet Take 325 mg by mouth daily.   gabapentin  100 MG capsule Commonly known as: NEURONTIN  Take 1 capsule (100 mg total) by mouth 3 (three) times daily. What changed: Another medication with the same name was removed. Continue taking this medication, and follow the directions you see here.   metoprolol  succinate 25 MG 24 hr tablet Commonly known as: TOPROL -XL TAKE 1 TABLET (25 MG TOTAL) BY MOUTH DAILY.    phenazopyridine  100 MG tablet Commonly known as: PYRIDIUM  Take 1 tablet (100 mg total) by mouth 3 (three) times daily as needed for pain.   polyethylene glycol 17 g packet Commonly known as: MIRALAX  / GLYCOLAX  Take 17 g by mouth daily as needed.   rivaroxaban  20 MG Tabs tablet Commonly known as: Xarelto  Take 1 tablet (20 mg total) by mouth daily with supper.        Contact information for follow-up providers     Christina Hardin, [place]. Schedule an appointment as soon as possible for a visit.   Specialty: Physician Assistant Why: Call the office and schedule a hospital follow up in the next 7-10 days. Contact information: [address] [place] [address] [phone]              Contact information for after-discharge care     Home Medical Care     [facility]) .   Service: Home Health Services Contact information: [address] [place]  [address] [phone]                    Discharge Exam: Fredricka Weights   01/01/25 0500 01/02/25 0424 01/04/25 0500  Weight: 76.7 kg 79.7 kg 80 kg  General-appears in no acute distress Heart-S1-S2, regular, no murmur auscultated Lungs-clear to auscultation bilaterally, no wheezing or crackles auscultated Abdomen-soft, nontender, no organomegaly Extremities-no edema in the lower extremities Neuro-alert, oriented x3, no focal deficit noted  Condition at discharge: good  The results of significant diagnostics from this hospitalization (including imaging, microbiology, ancillary and laboratory) are listed below for reference.   Imaging Studies: DG Chest 1 View Result Date: 12/31/2024 EXAM: 1 VIEW XRAY OF THE CHEST 12/31/2024 11:20:00 AM COMPARISON: None available. CLINICAL HISTORY: Shortness of breath. FINDINGS: LUNGS AND PLEURA: No focal pulmonary opacity. No pleural effusion. No pneumothorax. HEART AND MEDIASTINUM: No acute abnormality  of the cardiac and mediastinal silhouettes. BONES AND SOFT TISSUES: Surgical clips in left breast. No acute osseous abnormality. IMPRESSION: 1. No acute cardiopulmonary process. 2. Postsurgical changes of the left breast with surgical clips. Electronically signed by: Ryan Salvage MD 12/31/2024 12:08 PM EST RP Workstation: [id number]    Microbiology: Results for orders placed or performed in visit on 12/21/24  Urine Culture     Status: Abnormal   Collection Time: 12/21/24  9:00 AM   Specimen: Urine   UR  Result Value Ref Range Status   Urine Culture, Routine Final report (A)  Final   Organism ID, Bacteria Klebsiella pneumoniae (A)  Final    Comment: Cefazolin  with an MIC <=16 predicts susceptibility to the oral agents cefaclor, cefdinir, cefpodoxime, cefprozil, cefuroxime, cephalexin , and loracarbef when used for therapy of uncomplicated urinary tract infections due to E. coli, Klebsiella pneumoniae, and Proteus mirabilis. Greater than 100,000 colony forming units per mL    Antimicrobial Susceptibility Comment  Final    Comment:       ** S = Susceptible; I = Intermediate; R = Resistant **                    P = Positive; N = Negative             MICS are expressed in micrograms per mL    Antibiotic                 RSLT#1    RSLT#2    RSLT#3    RSLT#4 Amoxicillin /Clavulanic Acid    S Ampicillin                     R Cefazolin                       S Cefepime                       S Cefoxitin                      S Cefpodoxime                    S Ceftriaxone                    S Ciprofloxacin                  S Ertapenem                      S Gentamicin                     S Levofloxacin  S Meropenem                      S Nitrofurantoin                  R Piperacillin/Tazobactam        S Tetracycline                   S Tobramycin                     S Trimethoprim /Sulfa              S     Labs: CBC: Recent Labs  Lab 12/31/24 1058 01/01/25 0415  01/02/25 0713  WBC 7.1 5.3 5.5  HGB 13.7 10.6* 10.3*  HCT 39.7 31.2* 30.9*  MCV 98.0 99.4 101.3*  PLT 168 130* 128*   Basic Metabolic Panel: Recent Labs  Lab 12/31/24 1056 12/31/24 1058 01/01/25 0415 01/02/25 0713 01/03/25 1006 01/03/25 1613 01/04/25 1138  NA  --  137 138 141 141  --  138  K  --  5.3* 4.6 4.2 4.3  --  4.0  CL  --  105 112* 117* 115*  --  112*  CO2  --  19* 18* 17* 17*  --  18*  GLUCOSE  --  92 86 72 106*  --  77  BUN  --  26* 24* 19 13  --  11  CREATININE  --  2.64* 2.12* 1.61* 1.47*  --  1.42*  CALCIUM   --  13.5* 11.3* 11.1* 11.3*  --  11.8*  MG 2.4  --   --   --   --   --   --   PHOS 2.0*  --   --  2.1*  --  1.8*  --    Liver Function Tests: Recent Labs  Lab 12/31/24 1058 01/02/25 0713 01/03/25 1006 01/04/25 1138  AST 33 19 37 68*  ALT 23 13 24  52*  ALKPHOS 99 68 73 77  BILITOT 0.8 0.5 0.6 0.7  PROT 7.5 5.5* 5.9* 6.3*  ALBUMIN 4.3 3.2* 3.5 3.7   CBG: Recent Labs  Lab 01/01/25 0814 01/03/25 0804 01/04/25 0802  GLUCAP 69* 74 84    Discharge time spent: greater than 30 minutes.  Signed: Sabas GORMAN Brod, MD [facility] 01/04/2025 "

## 2025-01-04 NOTE — Care Management Important Message (Signed)
 Important Message  Patient Details  Name: Christina Hardin MRN: [id number] Date of Birth: 17-Aug-1951   Important Message Given:  Yes - Medicare IM     Claretta Deed 01/04/2025, 4:03 PM

## 2025-01-05 ENCOUNTER — Ambulatory Visit

## 2025-01-05 ENCOUNTER — Telehealth: Payer: Self-pay

## 2025-01-05 ENCOUNTER — Ambulatory Visit: Admitting: Gastroenterology

## 2025-01-05 NOTE — Progress Notes (Deleted)
 SABRA

## 2025-01-05 NOTE — Transitions of Care (Post Inpatient/ED Visit) (Signed)
 "  01/05/2025  Name: Christina Hardin MRN: [id number] DOB: [birth year]  Today's TOC FU Call Status: Today's TOC FU Call Status:: Successful TOC FU Call Completed TOC FU Call Complete Date: 01/05/25  Patient's Name and Date of Birth confirmed. Name, DOB  Transition Care Management Follow-up Telephone Call Date of Discharge: 01/04/25 Discharge Facility: [facility]) Type of Discharge: Inpatient Admission Primary Inpatient Discharge Diagnosis:: dehydration How have you been since you were released from the hospital?: Better Any questions or concerns?: No  Items Reviewed: Did you receive and understand the discharge instructions provided?: Yes Medications obtained,verified, and reconciled?: Yes (Medications Reviewed) Any new allergies since your discharge?: No Dietary orders reviewed?: Yes Do you have support at home?: Yes People in Home [RPT]: child(ren), adult  Medications Reviewed Today: Medications Reviewed Today     Reviewed by Emmitt Pan, LPN (Licensed Practical Nurse) on 01/05/25 at [phone]  Med List Status: <None>   Medication Order Taking? Sig Documenting Provider Last Dose Status Informant  ferrous sulfate  325 (65 FE) MG tablet [id number] Yes Take 325 mg by mouth daily. [provider]  Active Self, Child, Pharmacy Records  gabapentin  (NEURONTIN ) 100 MG capsule [id number] Yes Take 1 capsule (100 mg total) by mouth 3 (three) times daily. Cleotilde Perkins, DO  Active Self, Child, Pharmacy Records  metoprolol  succinate (TOPROL -XL) 25 MG 24 hr tablet [id number] Yes TAKE 1 TABLET (25 MG TOTAL) BY MOUTH DAILY. Cleotilde Perkins, DO  Active Self, Child, Pharmacy Records           Med Note (COFFELL, JON CHRISTELLA Schaumann Dec 31, 2024  3:23 PM) Empty Rx vial at bedside. Per dispense report, LF confirmed sold 12/21/24 #90, 90 DS. Patient states she has been out for months. Patient's Sun-Sat pill box has 2 days with this medication in it.  phenazopyridine  (PYRIDIUM ) 100 MG tablet [id number]  Yes Take 1 tablet (100 mg total) by mouth 3 (three) times daily as needed for pain. Gayle Saddie FALCON, PA-C  Active Self, Child, Pharmacy Records  phosphorus (K PHOS  NEUTRAL) 155-852-130 MG tablet [id number] Yes Take 1 tablet by mouth 4 (four) times daily -  with meals and at bedtime. Drusilla Sabas GORMAN, MD  Active   polyethylene glycol powder (GLYCOLAX /MIRALAX ) 17 GM/SCOOP powder [id number] Yes Take 17 g by mouth daily as needed. Dissolve 1 capful (17g) in 4-8 ounces of liquid and take by mouth daily. Drusilla Sabas GORMAN, MD  Active   pramipexole  (MIRAPEX ) 0.125 MG tablet [id number] Yes Take 0.125 mg by mouth daily. [provider]  Active Self, Child, Pharmacy Records           Med Note (COFFELL, JON CHRISTELLA Schaumann Dec 31, 2024  3:46 PM) Rx vial not at bedside. Medication found in her Sun-Sat pill box, identified using online identifier. No fill history found in the past 6 months for this medication.  rivaroxaban  (XARELTO ) 20 MG TABS tablet [id number] Yes Take 1 tablet (20 mg total) by mouth daily with supper. Janna Ferrier, DO  Active Self, Child, Pharmacy Records           Med Note (COFFELL, JON CHRISTELLA Schaumann Dec 31, 2024  3:31 PM)    Sennosides (EX-LAX PO) [id number] Yes Take 1 tablet by mouth daily as needed (constipation). [provider]  Active Self, Child, Pharmacy Records            Home Care and Equipment/Supplies: Were Home Health Services Ordered?: Yes Name of Home Health Agency::  unknown Has Agency set up a time to come to your home?: Yes First Home Health Visit Date: 01/05/25 Any new equipment or medical supplies ordered?: NA  Functional Questionnaire: Do you need assistance with bathing/showering or dressing?: Yes Do you need assistance with meal preparation?: Yes Do you need assistance with eating?: No Do you have difficulty maintaining continence: No Do you need assistance with getting out of bed/getting out of a chair/moving?: No Do you have difficulty managing or taking  your medications?: Yes  Follow up appointments reviewed: PCP Follow-up appointment confirmed?: No (declined) MD Provider Line Number:[phone] Given: No Specialist Hospital Follow-up appointment confirmed?: Yes Date of Specialist follow-up appointment?: 01/05/25 Follow-Up Specialty Provider:: GI Do you need transportation to your follow-up appointment?: No Do you understand care options if your condition(s) worsen?: Yes-patient verbalized understanding    SIGNATURE Julian Lemmings, LPN [facility] Nurse Health Advisor Direct Dial [phone]  "

## 2025-01-06 LAB — ACTH: C206 ACTH: 7.8 pg/mL (ref 7.2–63.3)

## 2025-01-12 ENCOUNTER — Ambulatory Visit

## 2025-01-13 ENCOUNTER — Inpatient Hospital Stay: Admitting: Family Medicine

## 2025-03-25 ENCOUNTER — Encounter

## 2025-05-12 ENCOUNTER — Ambulatory Visit: Admitting: Cardiology
